# Patient Record
Sex: Female | Born: 1960 | Race: White | Hispanic: No | Smoking: Never smoker
Health system: Southern US, Community
[De-identification: ages and names within clinical notes are randomized; demographics above are authoritative.]

## PROBLEM LIST (undated history)

## (undated) DIAGNOSIS — E559 Vitamin D deficiency, unspecified: Secondary | ICD-10-CM

## (undated) DIAGNOSIS — F32A Depression, unspecified: Secondary | ICD-10-CM

## (undated) DIAGNOSIS — F319 Bipolar disorder, unspecified: Secondary | ICD-10-CM

## (undated) DIAGNOSIS — E785 Hyperlipidemia, unspecified: Secondary | ICD-10-CM

## (undated) DIAGNOSIS — R011 Cardiac murmur, unspecified: Secondary | ICD-10-CM

## (undated) DIAGNOSIS — C801 Malignant (primary) neoplasm, unspecified: Secondary | ICD-10-CM

## (undated) DIAGNOSIS — N979 Female infertility, unspecified: Secondary | ICD-10-CM

## (undated) DIAGNOSIS — Z8719 Personal history of other diseases of the digestive system: Secondary | ICD-10-CM

## (undated) DIAGNOSIS — R7303 Prediabetes: Secondary | ICD-10-CM

## (undated) DIAGNOSIS — K219 Gastro-esophageal reflux disease without esophagitis: Secondary | ICD-10-CM

## (undated) DIAGNOSIS — F329 Major depressive disorder, single episode, unspecified: Secondary | ICD-10-CM

## (undated) DIAGNOSIS — F419 Anxiety disorder, unspecified: Secondary | ICD-10-CM

## (undated) DIAGNOSIS — R739 Hyperglycemia, unspecified: Secondary | ICD-10-CM

## (undated) DIAGNOSIS — R112 Nausea with vomiting, unspecified: Secondary | ICD-10-CM

## (undated) DIAGNOSIS — T4145XA Adverse effect of unspecified anesthetic, initial encounter: Secondary | ICD-10-CM

## (undated) DIAGNOSIS — G8929 Other chronic pain: Secondary | ICD-10-CM

## (undated) DIAGNOSIS — Z780 Asymptomatic menopausal state: Secondary | ICD-10-CM

## (undated) DIAGNOSIS — C55 Malignant neoplasm of uterus, part unspecified: Secondary | ICD-10-CM

## (undated) DIAGNOSIS — R002 Palpitations: Secondary | ICD-10-CM

## (undated) DIAGNOSIS — Z9889 Other specified postprocedural states: Secondary | ICD-10-CM

## (undated) DIAGNOSIS — I10 Essential (primary) hypertension: Secondary | ICD-10-CM

## (undated) DIAGNOSIS — E669 Obesity, unspecified: Secondary | ICD-10-CM

## (undated) HISTORY — DX: Depression, unspecified: F32.A

## (undated) HISTORY — DX: Prediabetes: R73.03

## (undated) HISTORY — DX: Asymptomatic menopausal state: Z78.0

## (undated) HISTORY — DX: Anxiety disorder, unspecified: F41.9

## (undated) HISTORY — DX: Essential (primary) hypertension: I10

## (undated) HISTORY — PX: LAPAROSCOPY: SHX197

## (undated) HISTORY — DX: Major depressive disorder, single episode, unspecified: F32.9

## (undated) HISTORY — DX: Gastro-esophageal reflux disease without esophagitis: K21.9

## (undated) HISTORY — DX: Bipolar disorder, unspecified: F31.9

## (undated) HISTORY — DX: Hyperlipidemia, unspecified: E78.5

## (undated) HISTORY — DX: Obesity, unspecified: E66.9

## (undated) HISTORY — DX: Palpitations: R00.2

## (undated) HISTORY — DX: Other chronic pain: G89.29

## (undated) HISTORY — DX: Female infertility, unspecified: N97.9

## (undated) HISTORY — DX: Hyperglycemia, unspecified: R73.9

## (undated) HISTORY — DX: Malignant neoplasm of uterus, part unspecified: C55

## (undated) HISTORY — DX: Vitamin D deficiency, unspecified: E55.9

---
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
## 1998-03-24 ENCOUNTER — Other Ambulatory Visit: Admission: RE | Admit: 1998-03-24 | Discharge: 1998-03-24 | Payer: Self-pay | Admitting: Obstetrics and Gynecology

## 2001-10-07 ENCOUNTER — Other Ambulatory Visit: Admission: RE | Admit: 2001-10-07 | Discharge: 2001-10-07 | Payer: Self-pay | Admitting: Obstetrics and Gynecology

## 2002-10-09 ENCOUNTER — Other Ambulatory Visit: Admission: RE | Admit: 2002-10-09 | Discharge: 2002-10-09 | Payer: Self-pay | Admitting: Obstetrics and Gynecology

## 2003-11-30 ENCOUNTER — Other Ambulatory Visit: Admission: RE | Admit: 2003-11-30 | Discharge: 2003-11-30 | Payer: Self-pay | Admitting: Obstetrics and Gynecology

## 2006-02-20 ENCOUNTER — Ambulatory Visit: Payer: Self-pay | Admitting: Cardiology

## 2006-03-06 ENCOUNTER — Ambulatory Visit: Payer: Self-pay

## 2008-09-07 ENCOUNTER — Ambulatory Visit: Payer: Self-pay | Admitting: Cardiology

## 2008-09-07 ENCOUNTER — Ambulatory Visit: Payer: Self-pay

## 2008-09-30 ENCOUNTER — Ambulatory Visit: Payer: Self-pay

## 2008-10-13 ENCOUNTER — Emergency Department (HOSPITAL_BASED_OUTPATIENT_CLINIC_OR_DEPARTMENT_OTHER): Admission: EM | Admit: 2008-10-13 | Discharge: 2008-10-13 | Payer: Self-pay | Admitting: Emergency Medicine

## 2008-10-21 ENCOUNTER — Ambulatory Visit: Payer: Self-pay | Admitting: Cardiology

## 2008-10-21 DIAGNOSIS — E7800 Pure hypercholesterolemia, unspecified: Secondary | ICD-10-CM | POA: Insufficient documentation

## 2008-10-21 DIAGNOSIS — R079 Chest pain, unspecified: Secondary | ICD-10-CM | POA: Insufficient documentation

## 2008-10-21 DIAGNOSIS — R002 Palpitations: Secondary | ICD-10-CM | POA: Insufficient documentation

## 2008-10-21 DIAGNOSIS — F319 Bipolar disorder, unspecified: Secondary | ICD-10-CM | POA: Insufficient documentation

## 2008-10-21 DIAGNOSIS — K219 Gastro-esophageal reflux disease without esophagitis: Secondary | ICD-10-CM | POA: Insufficient documentation

## 2010-10-16 ENCOUNTER — Encounter: Payer: Self-pay | Admitting: Gastroenterology

## 2011-01-25 ENCOUNTER — Emergency Department (HOSPITAL_BASED_OUTPATIENT_CLINIC_OR_DEPARTMENT_OTHER)
Admission: EM | Admit: 2011-01-25 | Discharge: 2011-01-25 | Disposition: A | Discharge status: Home / Self Care | Payer: Medicare Other | Attending: Emergency Medicine | Admitting: Emergency Medicine

## 2011-01-25 ENCOUNTER — Emergency Department (INDEPENDENT_AMBULATORY_CARE_PROVIDER_SITE_OTHER): Payer: Medicare Other

## 2011-01-25 DIAGNOSIS — H5789 Other specified disorders of eye and adnexa: Secondary | ICD-10-CM | POA: Insufficient documentation

## 2011-01-25 DIAGNOSIS — S0003XA Contusion of scalp, initial encounter: Secondary | ICD-10-CM

## 2011-01-25 DIAGNOSIS — S0180XA Unspecified open wound of other part of head, initial encounter: Secondary | ICD-10-CM | POA: Insufficient documentation

## 2011-01-25 DIAGNOSIS — S1093XA Contusion of unspecified part of neck, initial encounter: Secondary | ICD-10-CM

## 2011-01-25 DIAGNOSIS — F319 Bipolar disorder, unspecified: Secondary | ICD-10-CM | POA: Insufficient documentation

## 2011-01-25 DIAGNOSIS — E7800 Pure hypercholesterolemia, unspecified: Secondary | ICD-10-CM | POA: Insufficient documentation

## 2011-01-25 DIAGNOSIS — K219 Gastro-esophageal reflux disease without esophagitis: Secondary | ICD-10-CM | POA: Insufficient documentation

## 2011-01-25 DIAGNOSIS — R51 Headache: Secondary | ICD-10-CM

## 2011-01-30 ENCOUNTER — Emergency Department (HOSPITAL_BASED_OUTPATIENT_CLINIC_OR_DEPARTMENT_OTHER)
Admission: EM | Admit: 2011-01-30 | Discharge: 2011-01-30 | Disposition: A | Discharge status: Home / Self Care | Payer: Medicare Other | Attending: Emergency Medicine | Admitting: Emergency Medicine

## 2011-01-30 DIAGNOSIS — E7800 Pure hypercholesterolemia, unspecified: Secondary | ICD-10-CM | POA: Insufficient documentation

## 2011-01-30 DIAGNOSIS — K219 Gastro-esophageal reflux disease without esophagitis: Secondary | ICD-10-CM | POA: Insufficient documentation

## 2011-01-30 DIAGNOSIS — F319 Bipolar disorder, unspecified: Secondary | ICD-10-CM | POA: Insufficient documentation

## 2011-01-30 DIAGNOSIS — Z79899 Other long term (current) drug therapy: Secondary | ICD-10-CM | POA: Insufficient documentation

## 2011-01-30 DIAGNOSIS — Z4802 Encounter for removal of sutures: Secondary | ICD-10-CM | POA: Insufficient documentation

## 2011-02-07 NOTE — Assessment & Plan Note (Signed)
[facility]                            CARDIOLOGY OFFICE NOTE   [name], [name]                         MRN:          [id number]  DATE:09/07/2008                            DOB:          10-27-1960    Sherry Strong is a pleasant 50 year old female that I last saw on Feb 20, 2006.  At that time, she was complaining of chest pain.  We scheduled a  Myoview on March 06, 2006, that showed no ischemia or infarction.  Since  I last saw her, she has done reasonably well.  However in September, she  had an episode of chest pain.  This occurred after eating cheeseburger.  The pain was described as a pressure sensation.  It radiated to the  left.  It was not pleuritic or positional, nor is it exertional.  It  lasts for approximately 45 minutes and resolve spontaneously.  There was  no associated nausea, vomiting, shortness of breath, but there was mild  diaphoresis.  She did well until yesterday when she had a second  episode.  This occurred again after eating lunch.  It was similar in  description to the previous and again lasted 45 minutes.  Because of the  above, she wanted to be evaluated further.  Also note, she also has  palpitations.  She describes times where her heart is pounding.  This  can last anywhere from 5 minutes to an hour.  There is no associated  chest pain, shortness of breath, or presyncope.  She has had no frank  syncopal episodes.  These occur fairly frequently, although she finds it  difficult to quantitate.  She denies any dyspnea on exertion, orthopnea,  or PND.  There is no exertional chest pain.   MEDICATIONS:  1. Protonix 40 mg p.o. daily.  2. Pravachol 40 mg p.o. nightly.  3. Lamictal 100 mg p.o. nightly.  4. Xanax as needed.  5. Zyrtec as needed.   PHYSICAL EXAMINATION:  VITAL SIGNS:  Today shows a blood pressure of  122/80 and her pulse is 87.  She weighs 170 pounds.  HEENT:  Normal with normal eyelids.  NECK:  Supple with a  normal upstroke bilaterally.  No bruits.  There is  no jugular venous distention.  CHEST:  Clear to auscultation.  No expansion.  CARDIOVASCULAR:  Regular rate and rhythm.  Normal S1 and S2.  There are  no murmurs, rubs, or gallops noted.  There are no changes with Valsalva.  ABDOMEN:  Nontender, nondistended.  Positive bowel sounds.  No  hepatosplenomegaly.  No mass appreciated.  There is no abdominal bruit.  She has 2+ femoral pulses bilaterally.  No bruits.  EXTREMITIES:  No edema.  I could palpate no cords.  She has 2+ posterior  tibial pulses bilaterally.  NEUROLOGIC:  Grossly intact.   Electrocardiogram shows a sinus rhythm at rate of 87.  Prior septal  infarct cannot be excluded.  There are no significant ST changes noted.   DIAGNOSES:  1. Chest pain - Sherry Strong symptoms are somewhat atypical and more  likely to be GI in etiology.  However, she has a brother who had a      myocardial function in his 40s and she is very concerned about      these symptoms.  We will schedule her to have stress Myoview.  If      it shows normal perfusion, we will not pursue further cardiac      evaluation.  2. Palpitations - We will schedule her to have a CardioNet monitor to      make sure that she has not had significant arrhythmias.  3. Probable gastroesophageal reflux disease - She will continue on her      Protonix.  4. Hyperlipidemia - She will continue on her Pravachol and this is      being managed by her primary care physician.  5. History of bipolar disorder.   I will see her back in 6 weeks to review the above.     Madolyn Frieze Jens Som, MD, [facility]  Electronically Signed    BSC/MedQ  DD: 09/07/2008  DT: 09/08/2008  Job #: [id number]

## 2011-02-07 NOTE — Assessment & Plan Note (Signed)
[facility]                            CARDIOLOGY OFFICE NOTE   BRYANDA, MIKEL                         MRN:          [id number]  DATE:10/21/2008                            DOB:          04-06-1961    Ms. Crow is a 50 year old female that recently saw on September 07, 2008, secondary to atypical chest pain and palpitations.  A Myoview was  performed on September 30, 2008.  At that time, her ejection fraction was  77% and it was normal perfusion.  She also had a CardioNet monitor that  showed sinus to sinus tachycardia with occasional PACs.  Since I saw her  previously, she feels stressed.  She is having some financial issues.  She denies any dyspnea, chest pain, and palpitations have improved.  There is no pedal edema.   MEDICATIONS:  1. Protonix 40 mg p.o. daily.  2. Pravachol 40 mg p.o. daily.  3. Lamictal.   PHYSICAL EXAMINATION:  VITAL SIGNS:  Today shows a blood pressure that  is elevated at 137/91 and pulse 81.  She weighs 168 pounds.  HEENT:  Normal.  NECK:  Supple.  CHEST:  Clear.  CARDIOVASCULAR:  Regular rate and rhythm.  ABDOMEN:  No tenderness.  EXTREMITIES:  No edema.   DIAGNOSES:  1. Recent chest pain - the patient has had no further symptoms and it      is sounds this may have been gastroenterology in etiology.  Her      Myoview is normal.  We will not pursue this further.  2. Palpitations - her CardioNet monitor showed sinus to sinus      tachycardia with premature atrial contractions.  We have discussed      the possibility of adding the beta-blocker in the future if her      palpitations are worsen.  However, for now, we will not pursue this      as they have improved.  3. Elevated blood pressure - she will track this and we could add      medications in the future if remains elevated.  4. Gastroesophageal reflux disease - she will continue on Protonix.  5. Hyperlipidemia - she will continue her statin.  This is being     managed per primary care physician.  6. History of bipolar disorder.   I will see her back on an as-needed basis.     Madolyn Frieze Jens Som, MD, [facility]  Electronically Signed    BSC/MedQ  DD: 10/21/2008  DT: 10/21/2008  Job #: [id number]

## 2012-09-14 ENCOUNTER — Emergency Department (HOSPITAL_COMMUNITY)
Admission: EM | Admit: 2012-09-14 | Discharge: 2012-09-14 | Disposition: A | Discharge status: Home / Self Care | Payer: Medicare Other | Attending: Emergency Medicine | Admitting: Emergency Medicine

## 2012-09-14 ENCOUNTER — Encounter (HOSPITAL_COMMUNITY): Payer: Self-pay | Admitting: Emergency Medicine

## 2012-09-14 ENCOUNTER — Emergency Department (HOSPITAL_COMMUNITY): Payer: Medicare Other

## 2012-09-14 DIAGNOSIS — Y9389 Activity, other specified: Secondary | ICD-10-CM | POA: Insufficient documentation

## 2012-09-14 DIAGNOSIS — Y929 Unspecified place or not applicable: Secondary | ICD-10-CM | POA: Insufficient documentation

## 2012-09-14 DIAGNOSIS — S0003XA Contusion of scalp, initial encounter: Secondary | ICD-10-CM | POA: Insufficient documentation

## 2012-09-14 DIAGNOSIS — S1093XA Contusion of unspecified part of neck, initial encounter: Secondary | ICD-10-CM | POA: Insufficient documentation

## 2012-09-14 DIAGNOSIS — T148XXA Other injury of unspecified body region, initial encounter: Secondary | ICD-10-CM

## 2012-09-14 DIAGNOSIS — W1809XA Striking against other object with subsequent fall, initial encounter: Secondary | ICD-10-CM | POA: Insufficient documentation

## 2012-09-14 DIAGNOSIS — F411 Generalized anxiety disorder: Secondary | ICD-10-CM | POA: Insufficient documentation

## 2012-09-14 DIAGNOSIS — Z79899 Other long term (current) drug therapy: Secondary | ICD-10-CM | POA: Insufficient documentation

## 2012-09-14 NOTE — ED Notes (Signed)
Per pt, she was laying on the couch and she fell off. When she fell of she hit her face on the coffee table. Left eye is swollen and bruised. Pupils reactive to light. No LOC. No headache. Pain in left eye only. ETOH on board. Pt has anxiety. No bleeding noted.

## 2012-09-14 NOTE — ED Provider Notes (Signed)
Medical screening examination/treatment/procedure(s) were performed by non-physician practitioner and as supervising physician I was immediately available for consultation/collaboration.  Olivia Mackie, MD 09/14/12 [phone]

## 2012-09-14 NOTE — ED Provider Notes (Signed)
History     CSN: [id number]  Arrival date & time 09/14/12  0327   First MD Initiated Contact with Patient 09/14/12 [phone]      Chief Complaint  Patient presents with  . Fall  . Facial Swelling    (Consider location/radiation/quality/duration/timing/severity/associated sxs/prior treatment) HPI Comments: This is a 51 year old female, who presents emergency department with chief complaint of blunt trauma to cheek. Patient states that she rolled off the couch and hit her coffee table with the left side of her face. She states that she was drunk at that time. Patient states that her pain is 5/10, but that she is very anxious. She has used ice to control her symptoms. There are no aggravating or alleviating factors.  The history is provided by the patient. No language interpreter was used.    No past medical history on file.  No past surgical history on file.  No family history on file.  History  Substance Use Topics  . Smoking status: Not on file  . Smokeless tobacco: Not on file  . Alcohol Use: Not on file    OB History    No data available      Review of Systems  All other systems reviewed and are negative.    Allergies  Review of patient's allergies indicates no known allergies.  Home Medications   Current Outpatient Rx  Name  Route  Sig  Dispense  Refill  . ALPRAZOLAM 0.5 MG PO TABS   Oral   Take 0.5 mg by mouth 2 (two) times daily as needed. For anxiety         . BUSPIRONE HCL 10 MG PO TABS   Oral   Take 20 mg by mouth every evening.         . FENOFIBRATE 145 MG PO TABS   Oral   Take 145 mg by mouth every evening.         Marland Kitchen LAMOTRIGINE 200 MG PO TABS   Oral   Take 200 mg by mouth daily.         Marland Kitchen PANTOPRAZOLE SODIUM 40 MG PO TBEC   Oral   Take 40 mg by mouth daily.         Marland Kitchen PRAVASTATIN SODIUM 40 MG PO TABS   Oral   Take 40 mg by mouth every evening.           BP 154/87  Pulse 105  Temp 99.1 F (37.3 C) (Oral)  Resp 16  SpO2  99%  Physical Exam  Nursing note and vitals reviewed. Constitutional: She is oriented to person, place, and time. She appears well-developed and well-nourished.  HENT:  Head: Normocephalic.       Significant swelling to the left cheek and eye, tender to palpation. No step-off, crepitus, or palpable bony deformity, no evidence of orbital blowout.  Eyes: Conjunctivae normal and EOM are normal. Pupils are equal, round, and reactive to light.       No pain with eye movement, no bleeding in eye,  Neck: Normal range of motion. Neck supple.  Cardiovascular: Normal rate and regular rhythm.  Exam reveals no gallop and no friction rub.   No murmur heard. Pulmonary/Chest: Effort normal and breath sounds normal. No respiratory distress. She has no wheezes. She has no rales. She exhibits no tenderness.  Abdominal: Soft. Bowel sounds are normal. She exhibits no distension and no mass. There is no tenderness. There is no rebound and no guarding.  Musculoskeletal: Normal range of  motion. She exhibits no edema and no tenderness.  Neurological: She is alert and oriented to person, place, and time.  Skin: Skin is warm and dry.  Psychiatric: She has a normal mood and affect. Her behavior is normal. Judgment and thought content normal.    ED Course  Procedures (including critical care time)  No results found for this or any previous visit. Dg Facial Bones Complete  09/14/2012  *RADIOLOGY REPORT*  Clinical Data: Left orbital pain status post trauma.  FACIAL BONES COMPLETE 3+V  Comparison: 01/25/2011  Findings: Paranasal sinuses are clear.  No significantly displaced orbital fracture.  No aggressive osseous lesion.  IMPRESSION: No displaced fracture identified.  Radiographs have limited sensitivity for an orbital fracture and if concern persists, maxillofacial bone CT should be obtained.   Original Report Authenticated By: Jearld Lesch, M.D.       1. Contusion       MDM  51 year old female with  contusion to left side of her face. Have discussed patient with Dr. Norlene Campbell, who agrees with the plan. Patient's stable and ready for discharge. She understands and agrees with the plan.       Roxy Horseman, PA-C 09/14/12 [phone]

## 2014-08-10 DIAGNOSIS — K219 Gastro-esophageal reflux disease without esophagitis: Secondary | ICD-10-CM | POA: Insufficient documentation

## 2014-12-14 DIAGNOSIS — R251 Tremor, unspecified: Secondary | ICD-10-CM | POA: Insufficient documentation

## 2016-09-01 ENCOUNTER — Other Ambulatory Visit: Payer: Self-pay | Admitting: Nurse Practitioner

## 2016-09-01 DIAGNOSIS — R42 Dizziness and giddiness: Secondary | ICD-10-CM

## 2016-09-05 ENCOUNTER — Ambulatory Visit
Admission: RE | Admit: 2016-09-05 | Discharge: 2016-09-05 | Disposition: A | Discharge status: Home / Self Care | Payer: Medicare Other | Source: Ambulatory Visit | Attending: Nurse Practitioner | Admitting: Nurse Practitioner

## 2016-09-05 DIAGNOSIS — R42 Dizziness and giddiness: Secondary | ICD-10-CM

## 2016-09-26 ENCOUNTER — Encounter (HOSPITAL_COMMUNITY): Payer: Self-pay

## 2016-10-16 ENCOUNTER — Encounter (INDEPENDENT_AMBULATORY_CARE_PROVIDER_SITE_OTHER): Payer: Self-pay

## 2016-10-16 ENCOUNTER — Encounter (HOSPITAL_COMMUNITY): Payer: Self-pay | Admitting: Psychiatry

## 2016-10-16 ENCOUNTER — Ambulatory Visit (INDEPENDENT_AMBULATORY_CARE_PROVIDER_SITE_OTHER): Payer: Medicare Other | Admitting: Psychiatry

## 2016-10-16 VITALS — BP 126/78 | HR 70 | Ht 63.0 in | Wt 199.8 lb

## 2016-10-16 DIAGNOSIS — Z818 Family history of other mental and behavioral disorders: Secondary | ICD-10-CM

## 2016-10-16 DIAGNOSIS — F3131 Bipolar disorder, current episode depressed, mild: Secondary | ICD-10-CM

## 2016-10-16 DIAGNOSIS — Z79899 Other long term (current) drug therapy: Secondary | ICD-10-CM | POA: Diagnosis not present

## 2016-10-16 DIAGNOSIS — Z7982 Long term (current) use of aspirin: Secondary | ICD-10-CM | POA: Diagnosis not present

## 2016-10-16 MED ORDER — LAMOTRIGINE 200 MG PO TABS: 200.0000 mg | ORAL_TABLET | Freq: Every day | ORAL | 1 refills | Status: DC

## 2016-10-16 MED ORDER — HYDROXYZINE PAMOATE 25 MG PO CAPS: 25.0000 mg | ORAL_CAPSULE | Freq: Every day | ORAL | 1 refills | Status: DC | PRN

## 2016-10-16 MED ORDER — LURASIDONE HCL 120 MG PO TABS: 120.0000 mg | ORAL_TABLET | Freq: Every day | ORAL | 1 refills | Status: DC

## 2016-10-16 NOTE — Progress Notes (Signed)
[facility] Initial Assessment Note  Deshannon Urbano [id number] 56 y.o.  10/16/2016 9:55 AM  Chief Complaint:  I need a new psychiatrist.  My provider is leaving.    History of Present Illness:  Sherry Strong is 56 year old Caucasian, unemployed, divorced female who is referred from her primary care physician for the management of her panic attacks and bipolar disorder.  She was seeing [name] in [facility] who left the practice.  She mentioned that she has chronic symptoms of depression, panic attacks and impulsive behavior.  She is taking psychiatric medication and seeing counselor since age 40.  She had to stop seeing counselor because she felt it did not help.  She is taking Latuda, Lamictal, Xanax and reported no side effects.  She was also prescribed BuSpar however she stopped taking because she did not see any improvement.  She admitted lately more stressed because of financial stress.  Recently she moved to [facility] after her boyfriend father passed away in 01/18/23.  Patient mentioned his house was paid off and it was a good opportunity to move there.  However she admitted having issues with the boyfriend and financial burden causing more stress in her life.  She admitted poor sleep, racing thoughts and continued to have panic attacks.  However she is very reluctant to change her medication.  She had tried multiple psychiatric medication in the past and either she developed side effects or did not work.  Recently she had episodes of dizziness and she is scheduled to see neurologist next month.  Patient admitted easily emotional and having crying spells but denies any suicidal thoughts, paranoia, hallucination or any feeling of hopelessness or worthlessness.  She endorse increased weight gain because she had stopped exercise and watching her calorie intake.  She is tolerating her current psychiatric medication and reported no side effects including rash, itching, shakes or any tremors.  She do  not recall any manic symptoms and recent months but endorsed easily emotional, irritable, short temper and having racing thoughts.  In the past she had severe anger issues with impulsive and risky behavior.  She remember running away from house, sleeping with strangers, excessive buying, using drugs, fighting and road rage.  Patient currently not seeing any therapist but willing to see someone to give a try.  She endorse drinking alcohol once a week with her boyfriend but denies any intoxication, blackouts, seizures or tremors.  Currently she is taking Latuda 120 mg daily, Lamictal 200 mg daily and Xanax described 0.5 mg up to 4 times a day but she takes 2-3 times a day.  Patient denies any anhedonia, active or passive suicidal thoughts but endorsed decreased energy, fatigue, lack of attention and concentration.  She also endorsed poor sleep and depressed mood.  She denies any nightmares, flashback, obsessive thoughts, phobias, hypervigilance, delusion and self abusive behavior.  She endorse panic attack but usually last 10-15 minutes and reported Xanax helped him a lot.  She is living with her boyfriend.  She has 58 year old son who lives with her ex-husband.  Patient is on disability.  Suicidal Ideation: No Plan Formed: No Patient has means to carry out plan: No  Homicidal Ideation: No Plan Formed: No Patient has means to carry out plan: No  Past Psychiatric History/Hospitalization(s): Patient endorse symptoms of irritability, anger, impulsive behavior, depression and risky behavior most of her life.  She remember running away from home, sleeping with strangers, excessive buying, using drugs and drinking at early age and road rage started  in her teens.  She started seeing counselors at age 74 and prescribed Toprol medication through her primary care physician.  She remember taking Abilify, Wellbutrin, Effexor, Lexapro, Prozac, Klonopin, Ambien and lithium.  She remember either of these medication caused  side effects or did not help.  She denies any history of psychiatric inpatient treatment, suicidal attempt, at [facility] or any hallucination.  She was seeing [name] in [facility] who left the practice.  Family History; Patient endorse mother has depression and one cousin diagnosed with bipolar disorder.  Medical History; Patient has hyperlipidemia, increased triglycerides, GERD and recently having dizziness.  Her primary care physician is Eldridge Abrahams.  She scheduled to see neurologist next month.  Patient denies any history of seizures.  She had blood work in November 2017 which shows CBC normal except MCH 32.4.  Her comprehensive metabolic panel is normal area glucose 102, creatinine 0.84.  Her hemoglobin A1c is 5.3, triglyceride 161 and total cholesterol 223.  Traumatic brain injury: Patient denies any history of traumatic brain injury.  Education and Work History; Patient is high school graduate.  She is on disability.  Psychosocial History; Patient born in [place] but at age 81 to [place] closer to her grandparents.  She was never close to her father but close to her mother.  She married once and her marriage last 49 years.  Together they have 67 year old son.  Currently patient is living with her boyfriend.  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  Legal History; Patient denies any legal issues.  History Of Abuse; Patient denies any history of abuse.  Substance Abuse History; In the past she had a history of heavy drinking and using drugs but claims to be sober from drugs since teens.  She still drink alcohol once a week but denies any binge, intoxication, blackouts.  Review of Systems: Psychiatric: Agitation: No Hallucination: No Depressed Mood: No Insomnia: Yes Hypersomnia: No Altered Concentration: No Feels Worthless: No Grandiose Ideas: No Belief In Special Powers: No New/Increased Substance Abuse: No Compulsions:  No  Neurologic: Headache: No Seizure: No Paresthesias: No   Outpatient Encounter Prescriptions as of 10/16/2016  Medication Sig  . ALPRAZolam (XANAX) 0.5 MG tablet Take 0.5 mg by mouth 2 (two) times daily as needed. For anxiety  . aspirin EC 81 MG tablet Take 81 mg by mouth.  . fenofibrate (TRICOR) 145 MG tablet Take 145 mg by mouth every evening.  . hydrochlorothiazide (HYDRODIURIL) 12.5 MG tablet TAKE 1/2 TO 1 TABLET DAILY AS NEEDED EDEMA  . ibuprofen (ADVIL,MOTRIN) 800 MG tablet TAKE 1 TABLET BY MOUTH EVERY 8 HOURS AS NEEDED FOR PAIN WITH FOOD  . lamoTRIgine (LAMICTAL) 200 MG tablet Take 1 tablet (200 mg total) by mouth daily.  . Lurasidone HCl 120 MG TABS Take 1 tablet (120 mg total) by mouth at bedtime.  . metoprolol succinate (TOPROL-XL) 25 MG 24 hr tablet Take 25 mg by mouth.  . pantoprazole (PROTONIX) 40 MG tablet Take 40 mg by mouth daily.  . pravastatin (PRAVACHOL) 40 MG tablet Take 40 mg by mouth every evening.  . [DISCONTINUED] lamoTRIgine (LAMICTAL) 200 MG tablet Take 200 mg by mouth daily.  . [DISCONTINUED] Lurasidone HCl 120 MG TABS Take 120 mg by mouth.  . hydrOXYzine (VISTARIL) 25 MG capsule Take 1 capsule (25 mg total) by mouth daily as needed for anxiety.  . [DISCONTINUED] busPIRone (BUSPAR) 10 MG tablet Take 20 mg by mouth every evening.   No facility-administered encounter  medications on file as of 10/16/2016.     No results found for this or any previous visit (from the past 2160 hour(s)).    Constitutional:  BP 126/78   Pulse 70   Ht 5\' 3"  (1.6 m)   Wt 199 lb 12.8 oz (90.6 kg)   BMI 35.39 kg/m    Musculoskeletal: Strength & Muscle Tone: within normal limits Gait & Station: normal Patient leans: N/A  Psychiatric Specialty Exam: Physical Exam  Review of Systems  Constitutional: Negative.   HENT: Negative.   Respiratory: Negative.   Cardiovascular: Negative.   Musculoskeletal: Negative.   Skin: Negative for itching and rash.  Neurological:  Positive for dizziness.  Psychiatric/Behavioral: The patient has insomnia.     Blood pressure 126/78, pulse 70, height 5\' 3"  (1.6 m), weight 199 lb 12.8 oz (90.6 kg).Body mass index is 35.39 kg/m.  General Appearance: Casual and Emotional and easily tearful  Eye Contact:  Fair  Speech:  Clear and Coherent  Volume:  Normal  Mood:  Anxious  Affect:  Appropriate  Thought Process:  Goal Directed  Orientation:  Full (Time, Place, and Person)  Thought Content:  WDL and Logical  Suicidal Thoughts:  No  Homicidal Thoughts:  No  Memory:  Immediate;   Fair Recent;   Fair Remote;   Fair  Judgement:  Good  Insight:  Fair  Psychomotor Activity:  Normal  Concentration:  Concentration: Fair and Attention Span: Fair  Recall:  [employer] of Knowledge:  Good  Language:  Good  Akathisia:  No  Handed:  Right  AIMS (if indicated):     Assets:  Communication Skills Desire for Improvement Housing  ADL's:  Intact  Cognition:  WNL  Sleep:       Established Problem, Stable/Improving (1), New problem, with additional work up planned, Review of Psycho-Social Stressors (1), Review or order clinical lab tests (1), Decision to obtain old records (1), Review and summation of old records (2), Established Problem, Worsening (2), New Problem, with no additional work-up planned (3), Review of Medication Regimen & Side Effects (2) and Review of New Medication or Change in Dosage (2)  Assessment: Axis I: Bipolar disorder, depressed type.  Panic attacks.    Plan:  I review her symptoms, history, collateral information from Eldridge Abrahams, blood work, current medication and psychosocial stressors.  Patient still have anxiety and appears very emotional.  She does not want to increase her psychiatric medication.  I recommended to add low-dose Vistaril to help her anxiety and insomnia.  Discussed medication side effects and benefits.  She preferred to take Xanax from her primary care physician.  I offer to try  Klonopin but patient declined.  Continue Latuda 120 mg daily, Lamictal 200 mg daily.  I will also refer her to see therapist in this office for coping skills.  We will get records from her previous psychiatrist.  She is scheduled to see neurologist for her dizziness.  Recommended to call us back if she has any question, concern if she feels worsening of the symptom.  Follow-up in 6 weeks.  ARFEEN,SYED T., MD 10/16/2016

## 2016-11-02 ENCOUNTER — Ambulatory Visit (INDEPENDENT_AMBULATORY_CARE_PROVIDER_SITE_OTHER): Payer: Medicare Other | Admitting: Psychiatry

## 2016-11-02 DIAGNOSIS — F3131 Bipolar disorder, current episode depressed, mild: Secondary | ICD-10-CM | POA: Diagnosis not present

## 2016-11-04 NOTE — Progress Notes (Signed)
Comprehensive Clinical Assessment (CCA) Note  11/04/2016 Sherry Strong [facility] [id number]  Visit Diagnosis:      ICD-9-CM ICD-10-CM   1. Bipolar affective disorder, currently depressed, mild ([place]) 296.51 F31.31       CCA Part One  Part One has been completed on paper by the patient.  (See scanned document in Chart Review)  CCA Part Two A  Intake/Chief Complaint:  CCA Intake With Chief Complaint CCA Part Two Date: 11/02/16 Chief Complaint/Presenting Problem: bipolar disorder Patients Currently Reported Symptoms/Problems: sadness, tearfulness, anxiety, tension in body, overeating carbohydrates Collateral Involvement: none Individual's Strengths: supportive, encouraging Individual's Abilities: caregiver for her mother Type of Services Patient Feels Are Needed: therapy  Mental Health Symptoms Depression:  Depression: Difficulty Concentrating, Fatigue, Hopelessness, Increase/decrease in appetite, Irritability, Sleep (too much or little) (sleeps 6 hours a night)  Mania:  Mania: Irritability, Racing thoughts  Anxiety:   Anxiety: Difficulty concentrating, Fatigue, Irritability, Restlessness, Tension, Worrying  Psychosis:  Psychosis: N/A  Trauma:  Trauma: N/A  Obsessions:  Obsessions: N/A  Compulsions:  Compulsions: N/A  Inattention:     Hyperactivity/Impulsivity:  Hyperactivity/Impulsivity: Fidgets with hands/feet, Feeling of restlessness, Symptoms present before age 52, Talks excessively  Oppositional/Defiant Behaviors:  Oppositional/Defiant Behaviors: N/A  Borderline Personality:  Emotional Irregularity: N/A  Other Mood/Personality Symptoms:      Mental Status Exam Appearance and self-care  Stature:  Stature: Small  Weight:  Weight: Overweight  Clothing:  Clothing: Casual  Grooming:  Grooming: Normal  Cosmetic use:  Cosmetic Use: Age appropriate  Posture/gait:  Posture/Gait: Normal  Motor activity:  Motor Activity: Restless  Sensorium  Attention:  Attention: Normal   Concentration:  Concentration: Normal  Orientation:  Orientation: X5  Recall/memory:  Recall/Memory: Normal  Affect and Mood  Affect:  Affect: Anxious  Mood:  Mood: Anxious  Relating  Eye contact:  Eye Contact: Normal  Facial expression:  Facial Expression: Responsive  Attitude toward examiner:  Attitude Toward Examiner: Cooperative  Thought and Language  Speech flow: Speech Flow: Normal  Thought content:  Thought Content: Appropriate to mood and circumstances  Preoccupation:     Hallucinations:     Organization:     Transport planner of Knowledge:  Fund of Knowledge: Average  Intelligence:  Intelligence: Average  Abstraction:  Abstraction: Normal  Judgement:  Judgement: Normal  Reality Testing:  Reality Testing: Realistic  Insight:  Insight: Good  Decision Making:  Decision Making: Normal  Social Functioning  Social Maturity:  Social Maturity: Isolates  Social Judgement:  Social Judgement: Normal  Stress  Stressors:  Stressors: Chiropodist, Housing, Family conflict, Transitions  Coping Ability:  Coping Ability: English as a second language teacher Deficits:     Supports:      Family and Psychosocial History: Family history Marital status: Divorced Divorced, when?: divorced in 2008; long-term relationship with boyfriend since 2009 Long term relationship, how long?: 9 years What types of issues is patient dealing with in the relationship?: she and partner have different personality types; Pt. 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 Problems with trust; inconsistent values Are you sexually active?: Yes What is your sexual orientation?: heterosexual Has your sexual activity been affected by drugs, alcohol, medication, or emotional stress?: no Does patient have children?: Yes How many children?: 1 How is patient's relationship with their children?: very good  Childhood History:  Childhood History By whom was/is the patient raised?: Both parents Additional childhood  history information: both parents raised until 9th grade when parents divorced Description of patient's relationship with  caregiver when they were a child: relationship with father "fell off the radar" after the divorce and has been on again and off again. Mother has dementia diagnosed about 4 years ago and Pt. is her caregiver Patient's description of current relationship with people who raised him/her: caregiver for her mother Does patient have siblings?: Yes Number of Siblings: 1 Description of patient's current relationship with siblings: brother is 3 years younger Did patient suffer any verbal/emotional/physical/sexual abuse as a child?: No Did patient suffer from severe childhood neglect?: No Has patient ever been sexually abused/assaulted/raped as an adolescent or adult?: No Was the patient ever a victim of a crime or a disaster?: No Witnessed domestic violence?: No Has patient been effected by domestic violence as an adult?: No  CCA Part Two B  Employment/Work Situation: Employment / Work Situation Employment situation: Employed Where is patient currently employed?: 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 How long has patient been employed?: Since February 2017 Patient's job has been impacted by current illness: No What is the longest time patient has a held a job?: April 1988-2007 Where was the patient employed at that time?: [employer] Has patient ever been in the [employer]?: No Has patient ever served in combat?: No Did You Receive Any Psychiatric Treatment/Services While in Passenger transport manager?: No Are There Guns or Other Weapons in Your Home?: No Are These Psychologist, educational?: Yes  Education: Education School Currently Attending: none Last Grade Completed: 12 Name of High School: [employer] Did Teacher, adult education From [employer]?: Yes Did Physicist, medical?: No Did Heritage manager?: No Did You Have An Individualized Education Program (IIEP):  No Did You Have Any Difficulty At [employer]?: No  Religion: Religion/Spirituality Are You A Religious Person?: Yes What is Your Religious Affiliation?: [employer] How Might This Affect Treatment?: n/a  Leisure/Recreation: Leisure / Recreation Leisure and Hobbies: scrapbooking  Exercise/Diet: Exercise/Diet Do You Exercise?: No Have You Gained or Lost A Significant Amount of Weight in the Past Six Months?: No Do You Follow a Special Diet?: No Do You Have Any Trouble Sleeping?: Yes Explanation of Sleeping Difficulties: Sleeps about 6 hours a night  CCA Part Two C  Alcohol/Drug Use: Alcohol / Drug Use Pain Medications: none Over the Counter: supplements from [facility] History of alcohol / drug use?: No history of alcohol / drug abuse                      CCA Part Three  ASAM's:  Six Dimensions of Multidimensional Assessment  Dimension 1:  Acute Intoxication and/or Withdrawal Potential:     Dimension 2:  Biomedical Conditions and Complications:     Dimension 3:  Emotional, Behavioral, or Cognitive Conditions and Complications:     Dimension 4:  Readiness to Change:     Dimension 5:  Relapse, Continued use, or Continued Problem Potential:     Dimension 6:  Recovery/Living Environment:      Substance use Disorder (SUD)    Social Function:  Social Functioning Social Maturity: Isolates Social Judgement: Normal  Stress:  Stress Stressors: Chiropodist, Housing, Family conflict, Transitions Coping Ability: Overwhelmed Patient Takes Medications The Way The Doctor Instructed?: Yes Priority Risk: Moderate Risk  Risk Assessment- Self-Harm Potential: Risk Assessment For Self-Harm Potential Thoughts of Self-Harm: No current thoughts Method: No plan Availability of Means: No access/NA  Risk Assessment -Dangerous to Others Potential: Risk Assessment For Dangerous to Others Potential Method: No Plan Availability of Means:  No access or NA Notification Required: No need or identified  person  DSM5 Diagnoses: Patient Active Problem List   Diagnosis Date Noted  . HYPERCHOLESTEROLEMIA, PURE 10/21/2008  . BIPOLAR DISORDER UNSPECIFIED 10/21/2008  . GERD 10/21/2008  . PALPITATIONS 10/21/2008  . CHEST PAIN-UNSPECIFIED 10/21/2008    Patient Centered Plan: Patient is on the following Treatment Plan(s):  Pt. To complete treatment plan with individual therapist.  Recommendations for Services/Supports/Treatments: Recommendations for Services/Supports/Treatments Recommendations For Services/Supports/Treatments: IOP (Intensive Outpatient Program), Individual Therapy  Treatment Plan Summary:    Referrals to Alternative Service(s): Referred to Alternative Service(s):   Place:   Date:   Time:    Referred to Alternative Service(s):   Place:   Date:   Time:    Referred to Alternative Service(s):   Place:   Date:   Time:    Referred to Alternative Service(s):   Place:   Date:   Time:     [name]

## 2016-11-15 ENCOUNTER — Ambulatory Visit: Payer: Medicare Other | Admitting: Neurology

## 2016-11-16 ENCOUNTER — Ambulatory Visit (INDEPENDENT_AMBULATORY_CARE_PROVIDER_SITE_OTHER): Payer: Medicare Other | Admitting: Psychiatry

## 2016-11-16 DIAGNOSIS — F3131 Bipolar disorder, current episode depressed, mild: Secondary | ICD-10-CM

## 2016-11-21 NOTE — Progress Notes (Signed)
   THERAPIST PROGRESS NOTE  Session Time: 1:10-2:00  Participation Level: Active  Behavioral Response: CasualAlertAnxious  Type of Therapy: Individual Therapy  Treatment Goals addressed: Anxiety  Interventions: Strength-based and Supportive  Summary: Sherry Strong is a 56 y.o. female who presents with anxiety; insomnia.   Suicidal/Homicidal: Nowithout intent/plan  Therapist Response: Pt. Discussed primary problems of anxiety and insomnia. Pt. Reports that she has been taking xanax for the past 26 years and does not believe that she can live without it. Pt. Reports feelings of loss of control, feeling as if she cannot breathe, shaking, sweating. Pt. Reports that her panic attacks tend to be triggered when she is open, public places such as malls. Pt. Reports that she will often take a xanax in fear of the panic attack so that she can go to an open public place, and also that just knowing that she has the xanax in her purse will help her panic from escalating. Pt. Reports that she takes a xanax every night for sleep, but that she is able to go to sleep her sleep is frequently interrupted after 3-4 hours and she is not able to go back to sleep. Pt. Discussed that her primary care provider strongly wants to taper her off of xanax but that she is resistant because she does not believe that she will be able to function without it. Pt. Communicated that she is highly motivated to work on sleep disturbance and Counselor provided psychoeducation about how xanax is contributing to her anxiety. Counselor also provided psychoeducation about sleep hygiene which Pt. Indicated that she already does such as turning off tv one hour before bed. Pt. Reported that she stopped taking vistaril because dose was not helping with sleep. Pt. Was advised not to stop taking xanax without direction from her primary care and psychiatrist and would develop plan with Dr. Adele Schilder to assist with step down safely and possible  adjustment of meds to assist with the discomfort.  Plan: Return again in 2-3 weeks.  Diagnosis: Axis I: Anxiety Disorder NOS    Axis II: No diagnosis    [name], [facility] 11/21/2016

## 2016-11-28 ENCOUNTER — Encounter (HOSPITAL_COMMUNITY): Payer: Self-pay | Admitting: Psychiatry

## 2016-11-28 ENCOUNTER — Ambulatory Visit (INDEPENDENT_AMBULATORY_CARE_PROVIDER_SITE_OTHER): Payer: Medicare Other | Admitting: Psychiatry

## 2016-11-28 VITALS — BP 126/72 | HR 75 | Ht 63.0 in | Wt 205.4 lb

## 2016-11-28 DIAGNOSIS — F3131 Bipolar disorder, current episode depressed, mild: Secondary | ICD-10-CM | POA: Diagnosis not present

## 2016-11-28 DIAGNOSIS — Z79899 Other long term (current) drug therapy: Secondary | ICD-10-CM | POA: Diagnosis not present

## 2016-11-28 DIAGNOSIS — F410 Panic disorder [episodic paroxysmal anxiety] without agoraphobia: Secondary | ICD-10-CM | POA: Diagnosis not present

## 2016-11-28 DIAGNOSIS — F419 Anxiety disorder, unspecified: Secondary | ICD-10-CM | POA: Diagnosis not present

## 2016-11-28 MED ORDER — LAMOTRIGINE 100 MG PO TABS
ORAL_TABLET | ORAL | 1 refills | Status: DC
Start: 2016-11-28 — End: 2017-01-16

## 2016-11-28 MED ORDER — LURASIDONE HCL 120 MG PO TABS: 120.0000 mg | ORAL_TABLET | Freq: Every day | ORAL | 1 refills | Status: DC

## 2016-11-28 MED ORDER — LAMOTRIGINE 200 MG PO TABS: 200.0000 mg | ORAL_TABLET | Freq: Every day | ORAL | 1 refills | Status: DC
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
## 2016-12-01 ENCOUNTER — Other Ambulatory Visit (HOSPITAL_COMMUNITY): Payer: Self-pay

## 2016-12-01 DIAGNOSIS — F3131 Bipolar disorder, current episode depressed, mild: Secondary | ICD-10-CM

## 2016-12-01 MED ORDER — LURASIDONE HCL 120 MG PO TABS: 120.0000 mg | ORAL_TABLET | Freq: Every day | ORAL | 0 refills | Status: DC

## 2016-12-01 NOTE — Progress Notes (Signed)
Patients pharmacy called me, her insurance will not pay at all if it is not a 90 day prescription. Patient can not afford to pay out of pocket for this medication Anette Guarneri). I went ahead and sent in 90 days so that patient could get her medication.

## 2016-12-07 ENCOUNTER — Ambulatory Visit (INDEPENDENT_AMBULATORY_CARE_PROVIDER_SITE_OTHER): Payer: Medicare Other | Admitting: Psychiatry

## 2016-12-07 DIAGNOSIS — F419 Anxiety disorder, unspecified: Secondary | ICD-10-CM

## 2016-12-07 DIAGNOSIS — F3131 Bipolar disorder, current episode depressed, mild: Secondary | ICD-10-CM

## 2016-12-08 NOTE — Progress Notes (Addendum)
   THERAPIST PROGRESS NOTE  Session Time: 3:05-4:00  Participation Level: Active  Behavioral Response: CasualAlertAnxious  Type of Therapy: Individual Therapy  Treatment Goals addressed: Anxiety  Interventions: Strength-based and Supportive  Summary: Sherry Strong is a 56 y.o. female who presents with anxiety; insomnia.   Suicidal/Homicidal: Nowithout intent/plan  Therapist Response: Pt. Continues to present as anxious and complains of insomnia. Pt. Reports that her primary care provider did not begin taper of xanax and Pt. Was encouraged not to do so without plan from primary care and psychiatrist. Pt. Reports that she continues to take xanax during the day as needed, but would like to stop taking it at night if it would help to address her insomnia. Pt. Is taking melatonin and ashwaganda to help with insomnia and stress response and discussed with primary care. Session focused on thoughts that are contributing to Pt.'s anxiety and insomna which are related to her fears of her mother dying and belief that it will be difficult to find another job in the absence of caregiving for her mother, and as a result she would not be able to pay her bills and care for herself.  Significant part of session focused on life review and Pt.'s history in relationships and career. Pt. Was able to develop with assistance of counselor My past experience tell me 1) I can do hard things, 2) I am a problem solver, 3) I can face challenges without fear, 4) I have made hard decisions that have worked to my benefit in the past, 5) I am a survivor. Pt. Was able to identify as it relates to her career I am 1) resourceful, 2) I am creative, 3) I am motivated by challenging situations, 4) I am compassionate. Pt. Was encouraged to use these reframes daily to assist with lowering her anxiety.   Plan: Return again in 2-3 weeks.  Diagnosis:      Axis I: Anxiety Disorder NOS                          Axis II: No  diagnosis    [name], [facility] 12/08/2016

## 2016-12-27 ENCOUNTER — Ambulatory Visit (INDEPENDENT_AMBULATORY_CARE_PROVIDER_SITE_OTHER): Payer: Medicare Other | Admitting: Psychiatry

## 2016-12-27 DIAGNOSIS — F3131 Bipolar disorder, current episode depressed, mild: Secondary | ICD-10-CM

## 2016-12-27 NOTE — Progress Notes (Signed)
     THERAPIST PROGRESS NOTE  Session Time: 3:45-4:45  Participation Level:Active  Behavioral Response:CasualAlertAnxious  Type of Therapy: Individual Therapy  Treatment Goals addressed: Anxiety  Interventions:Strength-based and Supportive  Summary: Sherry Strong [id number] a 56 y.o.femalewho presents with anxiety; insomnia.   Suicidal/Homicidal:Nowithout intent/plan  Therapist Response: Pt. Continues to present as anxious and complains of insomnia. Counselor and pt. Completed initial treatment plan. Counselor reviewed grounding series and developed mantras "I am not stuck" "small steps matter" and "I always have a choice". Pt. Focused on stress as caregiver of 12 year old mother with dementia and feeling inadequate support from her brother. Pt. Reports that she had allergic reaction from vistaril that required medical attention and need to stop taking it immediately. Pt. Reports that her sleep is getting better and developed awareness that her hot flashes have been significantly interrupting her sleep. Pt. Was given instruction on use of the grounding series and instruction on how to use to manage anxiety and to facilitate sleep at bedtime. Pt. Discussed cognitive distortions regarding her employability, blocks toward developing motivation to accomplish personal and career tasks, and about her body image.   Plan: Return again in 1 week.  Diagnosis:Axis I:Anxiety Disorder NOS  Axis II:No diagnosis   Sherry Strong, [facility] 12/27/2016

## 2017-01-02 ENCOUNTER — Other Ambulatory Visit (HOSPITAL_COMMUNITY): Payer: Self-pay | Admitting: Psychiatry

## 2017-01-02 DIAGNOSIS — F3131 Bipolar disorder, current episode depressed, mild: Secondary | ICD-10-CM

## 2017-01-03 ENCOUNTER — Ambulatory Visit (HOSPITAL_COMMUNITY): Payer: Self-pay | Admitting: Psychiatry

## 2017-01-09 ENCOUNTER — Ambulatory Visit (HOSPITAL_COMMUNITY): Payer: Self-pay | Admitting: Psychiatry

## 2017-01-10 ENCOUNTER — Ambulatory Visit (HOSPITAL_COMMUNITY): Payer: Self-pay | Admitting: Psychiatry

## 2017-01-16 ENCOUNTER — Encounter (HOSPITAL_COMMUNITY): Payer: Self-pay | Admitting: Psychiatry

## 2017-01-16 ENCOUNTER — Ambulatory Visit (INDEPENDENT_AMBULATORY_CARE_PROVIDER_SITE_OTHER): Payer: Medicare Other | Admitting: Psychiatry

## 2017-01-16 DIAGNOSIS — F410 Panic disorder [episodic paroxysmal anxiety] without agoraphobia: Secondary | ICD-10-CM

## 2017-01-16 DIAGNOSIS — Z79899 Other long term (current) drug therapy: Secondary | ICD-10-CM | POA: Diagnosis not present

## 2017-01-16 DIAGNOSIS — F419 Anxiety disorder, unspecified: Secondary | ICD-10-CM

## 2017-01-16 DIAGNOSIS — F3131 Bipolar disorder, current episode depressed, mild: Secondary | ICD-10-CM

## 2017-01-16 MED ORDER — LAMOTRIGINE 200 MG PO TABS: 200.0000 mg | ORAL_TABLET | Freq: Every day | ORAL | 0 refills | Status: DC

## 2017-01-16 MED ORDER — LURASIDONE HCL 120 MG PO TABS: 120.0000 mg | ORAL_TABLET | Freq: Every day | ORAL | 0 refills | Status: DC
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
## 2017-01-17 ENCOUNTER — Ambulatory Visit (INDEPENDENT_AMBULATORY_CARE_PROVIDER_SITE_OTHER): Payer: Medicare Other | Admitting: Psychiatry

## 2017-01-17 DIAGNOSIS — F3131 Bipolar disorder, current episode depressed, mild: Secondary | ICD-10-CM | POA: Diagnosis not present

## 2017-01-18 NOTE — Progress Notes (Signed)
   THERAPIST PROGRESS NOTE  Session Time: 3:35-4:30  Participation Level:Active  Behavioral Response:CasualAlertAnxious  Type of Therapy: Individual Therapy  Treatment Goals addressed: Anxiety  Interventions:Strength-based and Supportive  Summary: Sherry Strong [id number] a 56y.o.femalewho presents with anxiety; insomnia.   Suicidal/Homicidal:Nowithout intent/plan  Therapist Response: Pt. Continues to present as talkative and engaged in the therapeutic process. Pt. 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 Counselor provided education on the need for support and discussed resources for group support for caregivers. Pt. Discussed her reluctance to discuss needs for support with her brother and her boyfriend because they do not understand the emotional and physical demands of daily caregiving. Pt. Also discussed impatience/low frustration tolerance with her significant other regarding his needs for conversation and connection. Counselor introduced love languages and processing about differences in how she and her significant other get their emotional needs met. Counselor introduced cognitive modeling CBFAR-circumstance, belief, feeling, action, result and worked through unintentional and intentional models. Pt. Worked through circumstance of observing the physical condition of her home which triggers thought that she should be doing more, and feeling overwhelmed, which causes her to respond by being "snippy" with her boyfriend, which results in her disconnecting with her boyfriend. Pt. Was given homework to try working on a thought model on her own.   Plan: Return again in 3 weeks.  Diagnosis:Axis I:Anxiety Disorder NOS  Axis II:No diagnosis   Sherry Strong, [facility] 01/18/2017

## 2017-01-23 ENCOUNTER — Ambulatory Visit (HOSPITAL_COMMUNITY): Payer: Self-pay | Admitting: Psychiatry

## 2017-02-04 ENCOUNTER — Other Ambulatory Visit (HOSPITAL_COMMUNITY): Payer: Self-pay | Admitting: Psychiatry

## 2017-02-04 DIAGNOSIS — F3131 Bipolar disorder, current episode depressed, mild: Secondary | ICD-10-CM

## 2017-02-27 ENCOUNTER — Telehealth (HOSPITAL_COMMUNITY): Payer: Self-pay | Admitting: Psychiatry

## 2017-02-27 ENCOUNTER — Ambulatory Visit (INDEPENDENT_AMBULATORY_CARE_PROVIDER_SITE_OTHER): Payer: Medicare Other | Admitting: Psychiatry

## 2017-02-27 DIAGNOSIS — F3131 Bipolar disorder, current episode depressed, mild: Secondary | ICD-10-CM | POA: Diagnosis not present

## 2017-03-01 ENCOUNTER — Ambulatory Visit (HOSPITAL_COMMUNITY): Payer: Self-pay | Admitting: Psychiatry

## 2017-03-01 ENCOUNTER — Telehealth (HOSPITAL_COMMUNITY): Payer: Self-pay | Admitting: Psychiatry

## 2017-03-05 NOTE — Progress Notes (Signed)
   THERAPIST PROGRESS NOTE    Session Time: 3:35-4:30  Participation Level:Active  Behavioral Response:CasualAlertAnxious  Type of Therapy: Individual Therapy  Treatment Goals addressed: Anxiety  Interventions:Strength-based and Supportive  Summary: Sherry Strong [id number] a 56y.o.femalewho presents with anxiety; insomnia.   Suicidal/Homicidal:Nowithout intent/plan  Therapist Response: Pt. Continues to present as mildly anxious, engaged in the therapeutic process. Pt. Expressed her frustration with scheduling an appointment every two weeks. Counselor apologized for changes in scheduling due to family medical concerns. Counselor offered to make referral or to set Pt. Up with once a week group with Charolotte Eke for the next 5 weeks until schedule changes back. Pt. Consented to starting once a week therapy group. Pt. Discussed that she is concerned about her finances, concerns about her boyfriend's mental health and depression, challenges of grocery shopping and meal planning, and needing time off from taking care of her mother who has dementia. Pt. Was encouraged to reinitiate communication regarding shopping and meal planning. Pt. Was able to identify that she enjoys the creative process of planning a meal, shopping and preparing the meal, but this process has become overwhelming for her because her boyfriend does the shopping and she is expected to cook a meal with no plan and is not consulted with shopping. The result is that she does not enjoy cooking and spends too much money on eating out which is not healthy for her. Pt. Also discussed that she has problems setting boundaries with him when he brings food home to her and is afraid of hurting his feelings is she refuses the food. Pt. Participated in problem solving and was open to solution of taking th snacks to her mother who enjoys them so that they will not be wasted. Pt. Agreed that this would work for her and he may get the  message to stop bringing the snacks. Pt. Was affirmed for wearing exercise clothes and for initiating exercise plan of walking in the evenings.   Plan: Return again in 3 weeks.  Diagnosis:Axis I:Anxiety Disorder NOS  Axis II:No diagnosis    [name], [facility] 03/05/2017

## 2017-03-07 ENCOUNTER — Ambulatory Visit (HOSPITAL_COMMUNITY): Payer: Self-pay | Admitting: Licensed Clinical Social Worker

## 2017-03-15 ENCOUNTER — Ambulatory Visit (HOSPITAL_COMMUNITY): Payer: Self-pay | Admitting: Psychiatry

## 2017-03-29 ENCOUNTER — Ambulatory Visit (HOSPITAL_COMMUNITY): Payer: Self-pay | Admitting: Psychiatry

## 2017-04-04 ENCOUNTER — Ambulatory Visit (INDEPENDENT_AMBULATORY_CARE_PROVIDER_SITE_OTHER): Payer: Medicare Other | Admitting: Psychiatry

## 2017-04-04 DIAGNOSIS — F3131 Bipolar disorder, current episode depressed, mild: Secondary | ICD-10-CM | POA: Diagnosis not present

## 2017-04-05 NOTE — Progress Notes (Signed)
   THERAPIST PROGRESS NOTE   Session Time: 1:05-2:00  Participation Level:Active  Behavioral Response:CasualAlertAnxious  Type of Therapy: Individual Therapy  Treatment Goals addressed: Anxiety  Interventions:Strength-based and Supportive  Summary: Robertta Halfhill IRSWNIOEV a 56y.o.femalewho presents with anxiety; insomnia.   Suicidal/Homicidal:Nowithout intent/plan  Therapist Response: Pt. Continues to present as mildly anxious, engaged in the therapeutic process. Pt. Has made significant progress with her self-esteem and self-image compared to first session. Pt. Stated that she has reduced her dependence on klonopin and that she rarely needs it to go to public places as before. Pt. Reports that she is comfortable going to the gym and wearing exercise attire and that she has been putting more effort into her dress and physical appearance. Pt. Also discussed that she has made several successful connections with girl friends and has made socializing with her friends a priority. Pt. Expressed her frustration with her boyfriend who she believes to be depressed and is isolating. Pt. Expressed her disappointment that he does not enjoy outings and traveling anymore. Pt. Acknowledges that she cannot change him and that he is resistant to seeking help for his anxiety and depression. Pt. Also expressed that she is capable of getting her social engagement needs without him. Pt. Continues to experience considerable stress that she attributes to caregiving of her mother with dementia. Counselor validated the caregiving stress that Pt. Is experiencing and encouraged her to continue seeking social support and physical exercise and nutrition as self-care.   Plan: Return again in 3weeks.  Diagnosis:Axis I:Anxiety Disorder NOS  Axis II:No diagnosis   [name], [facility] 04/05/2017

## 2017-04-12 ENCOUNTER — Ambulatory Visit (HOSPITAL_COMMUNITY): Payer: Self-pay | Admitting: Psychiatry

## 2017-04-17 ENCOUNTER — Ambulatory Visit (HOSPITAL_COMMUNITY): Payer: Self-pay | Admitting: Psychiatry

## 2017-04-17 ENCOUNTER — Other Ambulatory Visit (HOSPITAL_COMMUNITY): Payer: Self-pay | Admitting: Psychiatry

## 2017-04-17 DIAGNOSIS — F3131 Bipolar disorder, current episode depressed, mild: Secondary | ICD-10-CM

## 2017-04-19 ENCOUNTER — Ambulatory Visit (INDEPENDENT_AMBULATORY_CARE_PROVIDER_SITE_OTHER): Payer: Medicare Other | Admitting: Psychiatry

## 2017-04-19 DIAGNOSIS — F3131 Bipolar disorder, current episode depressed, mild: Secondary | ICD-10-CM | POA: Diagnosis not present

## 2017-04-23 NOTE — Progress Notes (Signed)
   THERAPIST PROGRESS NOTE   Session Time: 3:05-4:00  Participation Level:Active  Behavioral Response:CasualAlertMildlyAnxious  Type of Therapy: Individual Therapy  Treatment Goals addressed: Anxiety  Interventions:Strength-based and Supportive  Summary: Rosine Solecki [id number] a 56y.o.femalewho presents with anxiety; insomnia.   Suicidal/Homicidal:Nowithout intent/plan  Therapist Response: Pt. Continues to present as mildly anxious. Pt. Also continues to present with significant improvement in her self-esteem and self-image. Pt. Discussed her concerns about her home life, finances, and her boyfriend whom she believes to have a significant problem with depression and anxiety. Pt. Has moved forward with her commitment to travel and discussed plans for upcoming trip with her girlfriends. Pt. Discussed that she has taken responsibility for driving for the trip and discussed her concern that because her girlfriends enjoy staying up all night that her sleep schedule will be disrupted and will cause her anxiety while she drives. Significant time in session discussing the importance of self-care boundaries and how Pt. Can balance having fun with her friends and maintaining a sleep schedule that is good for her. Pt. Agreed that she would be able to state her needs especially on night before they leave the trip when she will be responsible for the driving. Pt. Discussed this pattern in another friend relationship about worrying about displeasing a friend due to a need to be consistent with her personal needs. Session focused on developing of self-awareness of physical needs and how to communicate those needs to significant others.    Plan: Return again in 3weeks.  Diagnosis:Axis I:Anxiety Disorder NOS  Axis II:No diagnosis  [name], [facility] 04/23/2017

## 2017-04-24 ENCOUNTER — Other Ambulatory Visit (HOSPITAL_COMMUNITY): Payer: Self-pay | Admitting: Psychiatry

## 2017-04-24 ENCOUNTER — Other Ambulatory Visit (HOSPITAL_COMMUNITY): Payer: Self-pay

## 2017-04-24 DIAGNOSIS — F3131 Bipolar disorder, current episode depressed, mild: Secondary | ICD-10-CM

## 2017-04-24 MED ORDER — LURASIDONE HCL 120 MG PO TABS: 120.0000 mg | ORAL_TABLET | Freq: Every day | ORAL | 0 refills | Status: DC

## 2017-04-24 NOTE — Progress Notes (Signed)
Patient called for a refill on Latuda 120 mg. She has an appointment on 8/7, however she is out now. Patient has been here in the last 3 months and has a follow up appointment. Per protocol, I sent a 90 day order in to the pharmacy (her insurance will not cover a 30 day supply). Called patient and made her aware.

## 2017-04-26 ENCOUNTER — Ambulatory Visit (HOSPITAL_COMMUNITY): Payer: Medicare Other | Admitting: Psychiatry

## 2017-04-30 ENCOUNTER — Telehealth (HOSPITAL_COMMUNITY): Payer: Self-pay | Admitting: Psychiatry

## 2017-04-30 ENCOUNTER — Other Ambulatory Visit (HOSPITAL_COMMUNITY): Payer: Self-pay | Admitting: Psychiatry

## 2017-05-01 ENCOUNTER — Encounter (HOSPITAL_COMMUNITY): Payer: Self-pay | Admitting: Psychiatry

## 2017-05-01 ENCOUNTER — Ambulatory Visit (INDEPENDENT_AMBULATORY_CARE_PROVIDER_SITE_OTHER): Payer: Medicare Other | Admitting: Psychiatry

## 2017-05-01 DIAGNOSIS — F3131 Bipolar disorder, current episode depressed, mild: Secondary | ICD-10-CM

## 2017-05-01 MED ORDER — LURASIDONE HCL 120 MG PO TABS: 120.0000 mg | ORAL_TABLET | Freq: Every day | ORAL | 0 refills | Status: DC

## 2017-05-01 MED ORDER — LAMOTRIGINE 200 MG PO TABS: 200.0000 mg | ORAL_TABLET | Freq: Every day | ORAL | 0 refills | Status: DC
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
## 2017-05-09 ENCOUNTER — Ambulatory Visit (INDEPENDENT_AMBULATORY_CARE_PROVIDER_SITE_OTHER): Payer: Medicare Other | Admitting: Psychiatry

## 2017-05-09 DIAGNOSIS — F3130 Bipolar disorder, current episode depressed, mild or moderate severity, unspecified: Secondary | ICD-10-CM | POA: Diagnosis not present

## 2017-05-09 DIAGNOSIS — F3131 Bipolar disorder, current episode depressed, mild: Secondary | ICD-10-CM

## 2017-05-16 NOTE — Progress Notes (Signed)
   THERAPIST PROGRESS NOTE  Session Time: 3:35-4:40  Participation Level:Active  Behavioral Response:CasualAlertAnxious  Type of Therapy: Individual Therapy  Treatment Goals addressed: Anxiety  Interventions:Strength-based and Supportive  Summary: Sherry Strong [id number] a 56y.o.femalewho presents with anxiety; insomnia.   Suicidal/Homicidal:Nowithout intent/plan  Therapist Response: Pt. Continues to present as anxious. Pt. Reports frustration that her symptoms do not seem to be getting better. Pt. Discussed that during recent visit with the psychiatrist her symptoms were described as "mood swings" which Pt. Disagrees that she has history of mood swings and does not believe that she is on correct medication and is not working for her. Pt. Discussed number of behavioral changes that have been recommended for her including breathing and meditation exercises that Pt. States that she tries to do consistently but that they have not worked for her. Significant part of session was focused on significant environmental stressors which include daily full-time caregiving for mother with dementia, living in home that she is unhappy, partner who is not active and does not engage with her due emotional problems, financial stress, and overwhelming feeling of being "trapped" by numerous stressors. Counselor discussed the dilemma of seeing a significant change with medication and coping behaviors as long as Pt. Does not begin to make active changes in her lifestyle and stressful living environment. Pt. Was encouraged to begin process of reversing pattern of all-or-nothing things by working on unpacking one box each week of the 30-40 boxes that fill up her home with the belongings from move that occurred several years ago. Pt. Committed to beginning to work on the process of resolving the unfinished business of the move and getting rid of personal items and moving forward on long-term goal of completing  her home.   Plan: Return again in 2-3weeks. Continue with CBT based therapy.  Diagnosis:Axis I:Anxiety Disorder NOS  Axis II:No diagnosis    [name], [facility] 05/16/2017

## 2017-05-23 ENCOUNTER — Ambulatory Visit (INDEPENDENT_AMBULATORY_CARE_PROVIDER_SITE_OTHER): Payer: Medicare Other | Admitting: Psychiatry

## 2017-05-23 DIAGNOSIS — F3131 Bipolar disorder, current episode depressed, mild: Secondary | ICD-10-CM | POA: Diagnosis not present

## 2017-05-29 ENCOUNTER — Ambulatory Visit (HOSPITAL_COMMUNITY): Payer: Self-pay | Admitting: Psychiatry

## 2017-06-01 NOTE — Progress Notes (Signed)
   THERAPIST PROGRESS NOTE  Session Time: 3:35-4:30  Participation Level:Active  Behavioral Response:CasualAlertMildlyAnxious  Type of Therapy: Individual Therapy  Treatment Goals addressed: Anxiety  Interventions:Strength-based and Supportive  Summary: Sherry Strong [id number] a 56y.o.femalewho presents with anxiety; insomnia.   Suicidal/Homicidal:Nowithout intent/plan  Therapist Response: Pt. Presents as mildly anxious. Pt. Discussed that her father packed his home and moved to [place] this week. Pt. Discussed that her father's actions were very hurtful because he did not give her an opportunity to see him before leaving and she fears that because of his health and her finances that she will not see him again. Pt. Discussed poor relationship history with her father, never feeling his emotional support since he left the marriage and remarried when she was in high school. Significant time in session was spent processing Pt.'s relationship with her father and how this relationship contributed to feelings of insecurity in her adolescent years and relationship to her current anxiety. Pt. Discussed that she was able to make some progress in her all-or-nothing thinking in working through clearing out her car so that she can make room for clearing the boxes in her home.   Plan: Return again in 2-3weeks. Continue with CBT based therapy.  Diagnosis:Axis I:Anxiety Disorder NOS  Axis II:No diagnosis   [name], [facility] 06/01/2017

## 2017-06-18 ENCOUNTER — Ambulatory Visit (INDEPENDENT_AMBULATORY_CARE_PROVIDER_SITE_OTHER): Payer: Self-pay | Admitting: Psychiatry

## 2017-06-18 DIAGNOSIS — F3131 Bipolar disorder, current episode depressed, mild: Secondary | ICD-10-CM

## 2017-06-19 ENCOUNTER — Encounter (INDEPENDENT_AMBULATORY_CARE_PROVIDER_SITE_OTHER): Payer: Self-pay
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
## 2017-06-25 ENCOUNTER — Ambulatory Visit (HOSPITAL_COMMUNITY): Payer: Self-pay | Admitting: Psychiatry

## 2017-07-09 ENCOUNTER — Ambulatory Visit (HOSPITAL_COMMUNITY): Payer: Self-pay | Admitting: Psychiatry

## 2017-07-12 ENCOUNTER — Encounter (INDEPENDENT_AMBULATORY_CARE_PROVIDER_SITE_OTHER): Payer: Self-pay | Admitting: Family Medicine

## 2017-07-12 ENCOUNTER — Ambulatory Visit (INDEPENDENT_AMBULATORY_CARE_PROVIDER_SITE_OTHER): Payer: Medicare Other | Admitting: Family Medicine

## 2017-07-12 VITALS — BP 119/83 | HR 65 | Temp 98.1°F | Ht 64.0 in | Wt 206.0 lb

## 2017-07-12 DIAGNOSIS — Z1331 Encounter for screening for depression: Secondary | ICD-10-CM | POA: Diagnosis not present

## 2017-07-12 DIAGNOSIS — E7849 Other hyperlipidemia: Secondary | ICD-10-CM | POA: Diagnosis not present

## 2017-07-12 DIAGNOSIS — R0602 Shortness of breath: Secondary | ICD-10-CM | POA: Diagnosis not present

## 2017-07-12 DIAGNOSIS — Z6835 Body mass index (BMI) 35.0-35.9, adult: Secondary | ICD-10-CM | POA: Diagnosis not present

## 2017-07-12 DIAGNOSIS — I10 Essential (primary) hypertension: Secondary | ICD-10-CM | POA: Diagnosis not present

## 2017-07-12 DIAGNOSIS — R5383 Other fatigue: Secondary | ICD-10-CM | POA: Insufficient documentation

## 2017-07-12 DIAGNOSIS — E559 Vitamin D deficiency, unspecified: Secondary | ICD-10-CM

## 2017-07-12 DIAGNOSIS — E538 Deficiency of other specified B group vitamins: Secondary | ICD-10-CM | POA: Diagnosis not present

## 2017-07-12 DIAGNOSIS — Z0289 Encounter for other administrative examinations: Secondary | ICD-10-CM

## 2017-07-12 DIAGNOSIS — R011 Cardiac murmur, unspecified: Secondary | ICD-10-CM

## 2017-07-12 DIAGNOSIS — F3130 Bipolar disorder, current episode depressed, mild or moderate severity, unspecified: Secondary | ICD-10-CM | POA: Diagnosis not present

## 2017-07-12 DIAGNOSIS — F319 Bipolar disorder, unspecified: Secondary | ICD-10-CM

## 2017-07-12 NOTE — Progress Notes (Signed)
.  Office: [phone]  /  Fax: [phone]   HPI:   Chief Complaint: OBESITY  Sherry Strong (MR# [id number]) is a 56 y.o. female who presents on 07/12/2017 for obesity evaluation and treatment. Current BMI is Body mass index is 35.36 kg/m.Sherry Strong has struggled with obesity for years and has been unsuccessful in either losing weight or maintaining long term weight loss. Sherry Strong attended our information session and states she is currently in the action stage of change and ready to dedicate time achieving and maintaining a healthier weight.  Sherry Strong states her family eats meals together she thinks her family will eat healthier with  her she struggles with family and or coworkers weight loss sabotage her desired weight loss is 67 lbs she started gaining weight in the last 2 yrs her heaviest weight ever was 207 lbs. she has significant food cravings issues  she snacks frequently in the evenings she skips meals frequently she is frequently drinking liquids with calories she frequently makes poor food choices she has problems with excessive hunger  she frequently eats larger portions than normal  she has binge eating behaviors she struggles with emotional eating    Sherry Strong feels her energy is lower than it should be. This has worsened with weight gain and has not worsened recently. [name] admits to daytime somnolence and admits to waking up still tired. Patient is at risk for obstructive sleep apnea. Patent has a history of symptoms of daytime Sherry and morning Sherry. Patient generally gets 5 or 6 hours of sleep per night, and states they generally have restless sleep. Snoring is present. Apneic episodes are present. Epworth Sleepiness Score is 4  Dyspnea on exertion Sherry Strong notes increasing shortness of breath with exercising and seems to be worsening over time with weight gain. She notes getting out of breath sooner with activity than she used to. This has not gotten worse recently. Sherry Strong  denies orthopnea.  Vitamin D deficiency Sherry Strong has a diagnosis of vitamin D deficiency. She is not currently taking vit D and admits Sherry but denies nausea, vomiting or muscle weakness.  Hyperglycemia Sherry Strong has no history of diabetes but is on antipsychotics and has a positive family history of diabetes. She denies polyphagia.  Bipolar Depression Sherry Strong has a diagnosis of bipolar depression and feels life isn't worth living approximately 50% of the time. She shows no sign of suicidal or homicidal ideations currently and she has no history of suicide attempts. Sherry Strong struggles with emotional eating and using food for comfort to the extent that it is negatively impacting her health. She often snacks when she is not hungry. Sherry Strong sometimes feels she is out of control and then feels guilty that she made poor food choices. She has been working on behavior modification techniques to help reduce her emotional eating and has been somewhat successful.  Vitamin B12 Deficiency Sherry Strong has a diagnosis of B12 insufficiency and notes Sherry. This is not a new diagnosis. Sherry Strong is not a vegetarian and does not have a previous diagnosis of pernicious anemia. She does not have a history of weight loss surgery. Sherry Strong is on OTC vitamin B12 supplement.  Hyperlipidemia Sherry Strong has hyperlipidemia and has been trying to improve her cholesterol levels with intensive lifestyle modification including a low saturated fat diet, exercise and weight loss. She is currently on pravastatin and fenofibrate and she denies any chest pain, claudication or myalgias.  Hypertension Sherry Strong is a 56 y.o. female with hypertension. Sherry Strong denies  chest pain or headache. She is working weight loss to help control her blood pressure with the goal of decreasing her risk of heart attack and stroke. Sherry Strong blood pressure is currently stable on metoprolol.  New Onset Heart Murmur Sherry Strong has a diagnosis of new onset heart murmur and denies chest  pain but admits to shortness of breath with activity. She has trace edema bilateral lower extremities and hypertension.   Depression Screen Sherry Strong's Food and Mood (modified PHQ-9) score was  Depression screen PHQ 2/9 07/12/2017  Decreased Interest 3  Down, Depressed, Hopeless 3  PHQ - 2 Score 6  Altered sleeping 3  Tired, decreased energy 3  Change in appetite 3  Feeling bad or failure about yourself  2  Trouble concentrating 3  Moving slowly or fidgety/restless 3  Suicidal thoughts 2  PHQ-9 Score 25  Difficult doing work/chores Extremely dIfficult    ALLERGIES: Allergies  Allergen Reactions  . Vistaril  [Hydroxyzine Hcl] Swelling    MEDICATIONS: Current Outpatient Prescriptions on File Prior to Visit  Medication Sig Dispense Refill  . ALPRAZolam (XANAX) 0.5 MG tablet Take 0.5 mg by mouth 2 (two) times daily as needed. For anxiety    . aspirin EC 81 MG tablet Take 81 mg by mouth.    . lamoTRIgine (LAMICTAL) 200 MG tablet Take 1 tablet (200 mg total) by mouth daily. 90 tablet 0  . metoprolol succinate (TOPROL-XL) 25 MG 24 hr tablet Take 25 mg by mouth daily.     . pantoprazole (PROTONIX) 40 MG tablet Take 40 mg by mouth daily.    . pravastatin (PRAVACHOL) 40 MG tablet Take 40 mg by mouth every evening.     No current facility-administered medications on file prior to visit.     PAST MEDICAL HISTORY: Past Medical History:  Diagnosis Date  . Anxiety   . Bipolar disorder ([place])   . Depression   . GERD (gastroesophageal reflux disease)   . HLD (hyperlipidemia)   . HTN (hypertension)   . Infertility, female   . Menopause     PAST SURGICAL HISTORY: Past Surgical History:  Procedure Laterality Date  . CESAREAN SECTION      SOCIAL HISTORY: Social History  Substance Use Topics  . Smoking status: Never Smoker  . Smokeless tobacco: Never Used  . Alcohol use 3.0 - 3.6 oz/week    5 - 6 Cans of beer per week     Comment: Friday nights    FAMILY HISTORY: Family  History  Problem Relation Age of Onset  . Heart disease Mother   . Depression Mother   . Hyperlipidemia Father   . Hypertension Father   . Heart disease Father   . Cancer Father   . Depression Father   . Anxiety disorder Father   . Sleep apnea Father     ROS: Review of Systems  Constitutional: Positive for malaise/Sherry.  HENT: Positive for congestion (nasal stuffiness), sinus pain and tinnitus.   Eyes:       Wear Glasses or Contacts  Respiratory: Positive for shortness of breath (on exertion).   Cardiovascular: Negative for chest pain and claudication.  Gastrointestinal: Positive for heartburn. Negative for nausea and vomiting.  Genitourinary: Positive for frequency.  Musculoskeletal: Negative for myalgias.       Negative muscle weakness  Skin:       Dryness Hair or Nail Changes  Neurological: Positive for dizziness. Negative for headaches.  Endo/Heme/Allergies:       Heat / Cold Intolerance Negative  polyphagia  Psychiatric/Behavioral: Positive for depression. Negative for suicidal ideas. The patient is nervous/anxious (nervousness) and has insomnia.        Stress     PHYSICAL EXAM: Blood pressure 119/83, pulse 65, temperature 98.1 F (36.7 C), temperature source Oral, height 5\' 4"  (1.626 m), weight 206 lb (93.4 kg), SpO2 99 %. Body mass index is 35.36 kg/m. Physical Exam  Constitutional: She is oriented to person, place, and time. She appears well-developed and well-nourished.  Cardiovascular:  Murmur (Grade 1/6 early systolic murmur) heard. Pulmonary/Chest: Effort normal.  Musculoskeletal: Normal range of motion. She exhibits edema (trace edema bilateral lower extremities).  Neurological: She is oriented to person, place, and time.  Skin: Skin is warm and dry.  Psychiatric: She has a normal mood and affect. Her behavior is normal.  Vitals reviewed.   RECENT LABS AND TESTS: BMET No results found for: NA, K, CL, CO2, GLUCOSE, BUN, CREATININE, CALCIUM,  GFRNONAA, GFRAA No results found for: HGBA1C No results found for: INSULIN CBC No results found for: WBC, RBC, HGB, HCT, PLT, MCV, MCH, MCHC, RDW, LYMPHSABS, MONOABS, EOSABS, BASOSABS Iron/TIBC/Ferritin/ %Sat No results found for: IRON, TIBC, FERRITIN, IRONPCTSAT Lipid Panel  No results found for: CHOL, TRIG, HDL, CHOLHDL, VLDL, LDLCALC, LDLDIRECT Hepatic Function Panel  No results found for: PROT, ALBUMIN, AST, ALT, ALKPHOS, BILITOT, BILIDIR, IBILI No results found for: TSH  ECG  shows NSR with a rate of 70 BPM INDIRECT CALORIMETER done today shows a VO2 of 257 and a REE of 1788. Her calculated basal metabolic rate is 6967 thus her basal metabolic rate is better than expected.    ASSESSMENT AND PLAN: Other Sherry - Plan: EKG 12-Lead, CBC With Differential, Comprehensive metabolic panel, Hemoglobin A1c, Insulin, random, T3, T4, free, TSH  Shortness of breath on exertion  Essential hypertension  Other hyperlipidemia - Plan: Lipid Panel With LDL/HDL Ratio  B12 nutritional deficiency - Plan: Vitamin B12  Bipolar depression (HCC)  Vitamin D deficiency - Plan: VITAMIN D 25 Hydroxy (Vit-D Deficiency, Fractures)  Newly recognized heart murmur  Depression screening  Class 2 severe obesity with serious comorbidity and body mass index (BMI) of 35.0 to 35.9 in adult, unspecified obesity type (HCC)  PLAN:  Sherry Kelli was informed that her Sherry may be related to obesity, depression or many other causes. Labs will be ordered, and in the meanwhile Emanuella has agreed to work on diet, exercise and weight loss to help with Sherry. Proper sleep hygiene was discussed including the need for 7-8 hours of quality sleep each night. A sleep study was not ordered based on symptoms and Epworth score.  Dyspnea on exertion Lizvette's shortness of breath appears to be obesity related and exercise induced. She has agreed to work on weight loss and gradually increase exercise to treat her exercise  induced shortness of breath. If Renie follows our instructions and loses weight without improvement of her shortness of breath, we will plan to refer to pulmonology. We will monitor this condition regularly. Shacoya agrees to this plan.  Vitamin D Deficiency Lauran was informed that low vitamin D levels contributes to Sherry and are associated with obesity, breast, and colon cancer. We will check labs and will follow up for routine testing of vitamin D, at least 2-3 times per year.   Hyperglycemia Fasting labs will be obtained and results with be discussed with Sherry Strong in 2 weeks at her follow up visit. In the meanwhile Monna was started on a lower simple carbohydrate diet and will  work on weight loss efforts.  Bipolar Depression We will monitor Damiyah's mood and subsequent emotional eating closely and she will continue to follow up with her counselor and her psychiatrist.  Vitamin B12 Deficiency Maritza will work on increasing B12 rich foods in her diet. B12 supplementation was not prescribed today. We will check labs and follow.  Hyperlipidemia Tiffiney was informed of the American Heart Association Guidelines emphasizing intensive lifestyle modifications as the first line treatment for hyperlipidemia. We discussed many lifestyle modifications today in depth, and Joetta will continue to work on decreasing saturated fats such as fatty red meat, butter and many fried foods. She will also increase vegetables and lean protein in her diet and continue to work on exercise and weight loss efforts. We will check labs and Verl will continue her medications as prescribed.  Hypertension We discussed sodium restriction, working on healthy weight loss, and a regular exercise program as the means to achieve improved blood pressure control. Bunny agreed with this plan and agreed to follow up as directed. We will continue to monitor her blood pressure as well as her progress with the above lifestyle modifications. She will continue  her medications as prescribed and will watch for signs of hypotension as she continues her lifestyle modifications.  New Onset Heart Murmur We will refer to [place] for echocardiogram and will follow.  Depression Screen Ketzaly had a strongly positive depression screening. Depression is commonly associated with obesity and often results in emotional eating behaviors. We will monitor this closely and work on CBT to help improve the non-hunger eating patterns. Referral to Psychology may be required if no improvement is seen as she continues in our clinic.  Obesity Delrose is currently in the action stage of change and her goal is to continue with weight loss efforts She has agreed to follow the Category 2 plan +100 calories Dalaya has been instructed to work up to a goal of 150 minutes of combined cardio and strengthening exercise per week for weight loss and overall health benefits. We discussed the following Behavioral Modification Strategies today: increasing lean protein intake, decreasing simple carbohydrates  and work on meal planning and easy cooking plans  Denette has agreed to follow up with our clinic in 2 weeks. She was informed of the importance of frequent follow up visits to maximize her success with intensive lifestyle modifications for her multiple health conditions. She was informed we would discuss her lab results at her next visit unless there is a critical issue that needs to be addressed sooner. Kyleah agreed to keep her next visit at the agreed upon time to discuss these results.  I, Doreene Nest, am acting as transcriptionist for Dennard Nip, MD  I have reviewed the above documentation for accuracy and completeness, and I agree with the above. -Dennard Nip, MD    OBESITY BEHAVIORAL INTERVENTION VISIT  Today's visit was # 1 out of 32.  Starting weight: 206 lbs Starting date: 07/12/17 Today's weight : 206 lbs Today's date:  07/12/2017 Total lbs lost to date: 0 (Patients must lose 7 lbs in the first 6 months to continue with counseling)   ASK: We discussed the diagnosis of obesity with Oakley Orban [id number] today and Chellsea agreed to give [name] permission to discuss obesity behavioral modification therapy today.  ASSESS: Tationa has the diagnosis of obesity and her BMI today is 35.34 Aryn is in the action stage of change   ADVISE: Cornell was educated on the multiple  health risks of obesity as well as the benefit of weight loss to improve her health. She was advised of the need for long term treatment and the importance of lifestyle modifications.  AGREE: Multiple dietary modification options and treatment options were discussed and  Ramesha agreed to follow the Category 2 plan +100 calories We discussed the following Behavioral Modification Strategies today: increasing lean protein intake, decreasing simple carbohydrates  and work on meal planning and easy cooking plans

## 2017-07-13 LAB — LIPID PANEL WITH LDL/HDL RATIO
Cholesterol, Total: 225 mg/dL — ABNORMAL HIGH (ref 100–199)
HDL: 53 mg/dL (ref >39)
LDL Calculated: 151 mg/dL — ABNORMAL HIGH (ref 0–99)
LDl/HDL Ratio: 2.8 ratio (ref 0.0–3.2)
Triglycerides: 105 mg/dL (ref 0–149)
VLDL Cholesterol Cal: 21 mg/dL (ref 5–40)

## 2017-07-13 LAB — CBC WITH DIFFERENTIAL
Basophils Absolute: 0 10*3/uL (ref 0.0–0.2)
Basos: 0 %
EOS (ABSOLUTE): 0.1 10*3/uL (ref 0.0–0.4)
Eos: 1 %
Hematocrit: 40.1 % (ref 34.0–46.6)
Hemoglobin: 13.1 g/dL (ref 11.1–15.9)
Immature Grans (Abs): 0 10*3/uL (ref 0.0–0.1)
Immature Granulocytes: 1 %
Lymphocytes Absolute: 1.5 10*3/uL (ref 0.7–3.1)
Lymphs: 23 %
MCH: 32.3 pg (ref 26.6–33.0)
MCHC: 32.7 g/dL (ref 31.5–35.7)
MCV: 99 fL — ABNORMAL HIGH (ref 79–97)
Monocytes Absolute: 0.5 10*3/uL (ref 0.1–0.9)
Monocytes: 8 %
Neutrophils Absolute: 4.3 10*3/uL (ref 1.4–7.0)
Neutrophils: 67 %
RBC: 4.05 x10E6/uL (ref 3.77–5.28)
RDW: 13.1 % (ref 12.3–15.4)
WBC: 6.5 10*3/uL (ref 3.4–10.8)

## 2017-07-13 LAB — COMPREHENSIVE METABOLIC PANEL
ALT: 50 IU/L — ABNORMAL HIGH (ref 0–32)
AST: 37 IU/L (ref 0–40)
Albumin/Globulin Ratio: 1.9 (ref 1.2–2.2)
Albumin: 4.8 g/dL (ref 3.5–5.5)
Alkaline Phosphatase: 82 IU/L (ref 39–117)
BUN/Creatinine Ratio: 11 (ref 9–23)
BUN: 9 mg/dL (ref 6–24)
Bilirubin Total: 0.2 mg/dL (ref 0.0–1.2)
CO2: 22 mmol/L (ref 20–29)
Calcium: 9.2 mg/dL (ref 8.7–10.2)
Chloride: 102 mmol/L (ref 96–106)
Creatinine, Ser: 0.85 mg/dL (ref 0.57–1.00)
GFR calc Af Amer: 89 mL/min/{1.73_m2} (ref >59)
GFR calc non Af Amer: 77 mL/min/{1.73_m2} (ref >59)
Globulin, Total: 2.5 g/dL (ref 1.5–4.5)
Glucose: 91 mg/dL (ref 65–99)
Potassium: 4.3 mmol/L (ref 3.5–5.2)
Sodium: 142 mmol/L (ref 134–144)
Total Protein: 7.3 g/dL (ref 6.0–8.5)

## 2017-07-13 LAB — HEMOGLOBIN A1C
Est. average glucose Bld gHb Est-mCnc: 108 mg/dL
Hgb A1c MFr Bld: 5.4 % (ref 4.8–5.6)

## 2017-07-13 LAB — T3: T3, Total: 148 ng/dL (ref 71–180)

## 2017-07-13 LAB — T4, FREE: Free T4: 1.25 ng/dL (ref 0.82–1.77)

## 2017-07-13 LAB — VITAMIN D 25 HYDROXY (VIT D DEFICIENCY, FRACTURES): Vit D, 25-Hydroxy: 25.9 ng/mL — ABNORMAL LOW (ref 30.0–100.0)

## 2017-07-13 LAB — TSH: TSH: 2.42 u[IU]/mL (ref 0.450–4.500)

## 2017-07-13 LAB — VITAMIN B12: Vitamin B-12: 965 pg/mL (ref 232–1245)

## 2017-07-13 LAB — INSULIN, RANDOM: INSULIN: 20.6 u[IU]/mL (ref 2.6–24.9)

## 2017-07-23 ENCOUNTER — Ambulatory Visit (HOSPITAL_COMMUNITY): Payer: Self-pay | Admitting: Psychiatry

## 2017-07-26 ENCOUNTER — Ambulatory Visit (INDEPENDENT_AMBULATORY_CARE_PROVIDER_SITE_OTHER): Payer: Medicare Other | Admitting: Family Medicine

## 2017-07-26 VITALS — BP 120/79 | HR 80 | Temp 98.4°F | Ht 64.0 in | Wt 201.0 lb

## 2017-07-26 DIAGNOSIS — E559 Vitamin D deficiency, unspecified: Secondary | ICD-10-CM

## 2017-07-26 DIAGNOSIS — R945 Abnormal results of liver function studies: Secondary | ICD-10-CM

## 2017-07-26 DIAGNOSIS — Z6834 Body mass index (BMI) 34.0-34.9, adult: Secondary | ICD-10-CM | POA: Diagnosis not present

## 2017-07-26 DIAGNOSIS — E8881 Metabolic syndrome: Secondary | ICD-10-CM

## 2017-07-26 DIAGNOSIS — E669 Obesity, unspecified: Secondary | ICD-10-CM

## 2017-07-26 DIAGNOSIS — E88819 Insulin resistance, unspecified: Secondary | ICD-10-CM

## 2017-07-26 DIAGNOSIS — R7989 Other specified abnormal findings of blood chemistry: Secondary | ICD-10-CM

## 2017-07-26 NOTE — Progress Notes (Signed)
Office: [phone]  /  Fax: [phone]   HPI:   Chief Complaint: OBESITY Sherry Strong is here to discuss her progress with her obesity treatment plan. She is on the Category 2 plan +100 calories and is following her eating plan approximately 85 % of the time. She states she is exercising 0 minutes 0 times per week. Sherry Strong has done well with weight loss but often skipped meals while mother was hospitalized. She also struggled with cravings and some polyphagia, especially for the first 2 or 3 days but then this improved. Her weight is 201 lb (91.2 kg) today and has had a weight loss of 5 pounds over a period of 2 weeks since her last visit. She has lost 5 lbs since starting treatment with [name].  Vitamin D deficiency Sherry Strong has a new diagnosis of vitamin D deficiency. She is not currently taking vit D and admits fatigue but denies nausea, vomiting or muscle weakness.  Elevated LFT Sherry Strong has a new dx of elevated ALT. She denies abdominal pain or jaundice and has never been told of any liver problems in the past. She denies excessive alcohol intake.  Insulin Resistance Sherry Strong has a new diagnosis of insulin resistance. She has a normal A1c and glucose but her elevated fasting insulin level >5 and she has polyphagia but this improved with a low simple carbohydrate diet. Although Sherry Strong's blood glucose readings are still under good control, insulin resistance puts her at greater risk of metabolic syndrome and diabetes.  She is not taking metformin currently and continues to work on diet and exercise to decrease risk of diabetes.  ALLERGIES: Allergies  Allergen Reactions  . Vistaril  [Hydroxyzine Hcl] Swelling    MEDICATIONS: Current Outpatient Prescriptions on File Prior to Visit  Medication Sig Dispense Refill  . ALPRAZolam (XANAX) 0.5 MG tablet Take 0.5 mg by mouth 2 (two) times daily as needed. For anxiety    . aspirin EC 81 MG tablet Take 81 mg by mouth.    . Cyanocobalamin (VITAMIN B 12 PO) Take by  mouth.    . Fenofibrate 150 MG CAPS Take 1 capsule by mouth daily.    Marland Kitchen lamoTRIgine (LAMICTAL) 200 MG tablet Take 1 tablet (200 mg total) by mouth daily. 90 tablet 0  . lurasidone (LATUDA) 80 MG TABS tablet Take 80 mg by mouth daily with breakfast.    . metoprolol succinate (TOPROL-XL) 25 MG 24 hr tablet Take 25 mg by mouth daily.     . pantoprazole (PROTONIX) 40 MG tablet Take 40 mg by mouth daily.    . pravastatin (PRAVACHOL) 40 MG tablet Take 40 mg by mouth every evening.    . vitamin C (ASCORBIC ACID) 500 MG tablet Take 500 mg by mouth daily.    Marland Kitchen zolpidem (AMBIEN) 10 MG tablet Take 10 mg by mouth at bedtime.     No current facility-administered medications on file prior to visit.     PAST MEDICAL HISTORY: Past Medical History:  Diagnosis Date  . Anxiety   . Bipolar disorder ([place])   . Depression   . GERD (gastroesophageal reflux disease)   . HLD (hyperlipidemia)   . HTN (hypertension)   . Infertility, female   . Menopause     PAST SURGICAL HISTORY: Past Surgical History:  Procedure Laterality Date  . CESAREAN SECTION      SOCIAL HISTORY: Social History  Substance Use Topics  . Smoking status: Never Smoker  . Smokeless tobacco: Never Used  . Alcohol use 3.0 -  3.6 oz/week    5 - 6 Cans of beer per week     Comment: Friday nights    FAMILY HISTORY: Family History  Problem Relation Age of Onset  . Heart disease Mother   . Depression Mother   . Hyperlipidemia Father   . Hypertension Father   . Heart disease Father   . Cancer Father   . Depression Father   . Anxiety disorder Father   . Sleep apnea Father     ROS: Review of Systems  Constitutional: Positive for malaise/fatigue and weight loss.  Gastrointestinal: Negative for abdominal pain, nausea and vomiting.  Musculoskeletal:       Negative muscle weakness  Skin:       Negative jaundice    PHYSICAL EXAM: Blood pressure 120/79, pulse 80, temperature 98.4 F (36.9 C), temperature source Oral, height  5\' 4"  (1.626 m), weight 201 lb (91.2 kg), SpO2 98 %. Body mass index is 34.5 kg/m. Physical Exam  Constitutional: She is oriented to person, place, and time. She appears well-developed and well-nourished.  Cardiovascular: Normal rate.   Pulmonary/Chest: Effort normal.  Musculoskeletal: Normal range of motion.  Neurological: She is oriented to person, place, and time.  Skin: Skin is warm and dry.  Psychiatric: She has a normal mood and affect. Her behavior is normal.  Vitals reviewed.   RECENT LABS AND TESTS: BMET    Component Value Date/Time   NA 142 07/12/2017 1055   K 4.3 07/12/2017 1055   CL 102 07/12/2017 1055   CO2 22 07/12/2017 1055   GLUCOSE 91 07/12/2017 1055   BUN 9 07/12/2017 1055   CREATININE 0.85 07/12/2017 1055   CALCIUM 9.2 07/12/2017 1055   GFRNONAA 77 07/12/2017 1055   GFRAA 89 07/12/2017 1055   Lab Results  Component Value Date   HGBA1C 5.4 07/12/2017   Lab Results  Component Value Date   INSULIN 20.6 07/12/2017   CBC    Component Value Date/Time   WBC 6.5 07/12/2017 1055   RBC 4.05 07/12/2017 1055   HGB 13.1 07/12/2017 1055   HCT 40.1 07/12/2017 1055   MCV 99 (H) 07/12/2017 1055   MCH 32.3 07/12/2017 1055   MCHC 32.7 07/12/2017 1055   RDW 13.1 07/12/2017 1055   LYMPHSABS 1.5 07/12/2017 1055   EOSABS 0.1 07/12/2017 1055   BASOSABS 0.0 07/12/2017 1055   Iron/TIBC/Ferritin/ %Sat No results found for: IRON, TIBC, FERRITIN, IRONPCTSAT Lipid Panel     Component Value Date/Time   CHOL 225 (H) 07/12/2017 1055   TRIG 105 07/12/2017 1055   HDL 53 07/12/2017 1055   LDLCALC 151 (H) 07/12/2017 1055   Hepatic Function Panel     Component Value Date/Time   PROT 7.3 07/12/2017 1055   ALBUMIN 4.8 07/12/2017 1055   AST 37 07/12/2017 1055   ALT 50 (H) 07/12/2017 1055   ALKPHOS 82 07/12/2017 1055   BILITOT 0.2 07/12/2017 1055      Component Value Date/Time   TSH 2.420 07/12/2017 1055    ASSESSMENT AND PLAN: Vitamin D deficiency - Plan:  Vitamin D, Ergocalciferol, (DRISDOL) 50000 units CAPS capsule  Elevated LFTs  Insulin resistance  Class 1 obesity with serious comorbidity and body mass index (BMI) of 34.0 to 34.9 in adult, unspecified obesity type  PLAN:  Vitamin D Deficiency Sherry Strong was informed that low vitamin D levels contributes to fatigue and are associated with obesity, breast, and colon cancer. She agrees to start to take prescription Vit D @50 ,000 IU every  week #4 with no refills. We will recheck labs in 3 months and will follow up for routine testing of vitamin D, at least 2-3 times per year. She was informed of the risk of over-replacement of vitamin D and agrees to not increase her dose unless he discusses this with [name] first. Sherry Strong agrees to follow up with our clinic in 2 weeks.  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 Sherry Strong was educated on her risk of developing NASH or even liver failure and the only proven treatment for NAFLD was weight loss. Sherry Strong agreed to continue with her weight loss efforts with healthier diet and exercise as an essential part of her treatment plan. We will recheck labs in 3 months and Dave agreed to follow up at the agreed upon time.  Insulin Resistance Sherry Strong will continue to work on weight loss, exercise, and decreasing simple carbohydrates in her diet to help decrease the risk of diabetes. She was informed that eating too many simple carbohydrates or too many calories at one sitting increases the likelihood of GI side effects. We will defer metformin for now. We will recheck labs in 3 months and Sherry Strong agreed to follow up with [name] as directed to monitor her progress.  Obesity Sherry Strong is currently in the action stage of change. As such, her goal is to continue with weight loss efforts She has agreed to follow the Category 2 plan Sherry Strong has been instructed to work up to a goal of 150 minutes of combined cardio  and strengthening exercise per week for weight loss and overall health benefits. We discussed the following Behavioral Modification Strategies today: no skipping meals, better snacking choices, increasing lean protein intake, decreasing simple carbohydrates, work on meal planning and easy cooking plans and emotional eating strategies  Sherry Strong has agreed to follow up with our clinic in 2 weeks. She was informed of the importance of frequent follow up visits to maximize her success with intensive lifestyle modifications for her multiple health conditions.  I, Doreene Nest, am acting as transcriptionist for Dennard Nip, MD  I have reviewed the above documentation for accuracy and completeness, and I agree with the above. -Dennard Nip, MD    OBESITY BEHAVIORAL INTERVENTION VISIT  Today's visit was # 2 out of 36.  Starting weight: 206 lbs Starting date: 07/12/17 Today's weight : 201 lbs Today's date: 07/26/2017 Total lbs lost to date: 5 (Patients must lose 7 lbs in the first 6 months to continue with counseling)   ASK: We discussed the diagnosis of obesity with Sherry Strong [id number] today and Sherry Strong agreed to give [name] permission to discuss obesity behavioral modification therapy today.  ASSESS: Sherry Strong has the diagnosis of obesity and her BMI today is 34.48 Sherry Strong is in the action stage of change   ADVISE: Sherry Strong was educated on the multiple health risks of obesity as well as the benefit of weight loss to improve her health. She was advised of the need for long term treatment and the importance of lifestyle modifications.  AGREE: Multiple dietary modification options and treatment options were discussed and  Sherry Strong agreed to follow the Category 2 plan We discussed the following Behavioral Modification Strategies today: no skipping meals, better snacking choices, increasing lean protein intake, decreasing simple carbohydrates, work on meal planning and easy cooking plans and emotional eating  strategies

## 2017-07-27 ENCOUNTER — Other Ambulatory Visit (HOSPITAL_COMMUNITY): Payer: Self-pay | Admitting: Psychiatry

## 2017-07-27 DIAGNOSIS — F3131 Bipolar disorder, current episode depressed, mild: Secondary | ICD-10-CM

## 2017-08-01 ENCOUNTER — Encounter (HOSPITAL_COMMUNITY): Payer: Self-pay | Admitting: Psychiatry

## 2017-08-01 ENCOUNTER — Ambulatory Visit (INDEPENDENT_AMBULATORY_CARE_PROVIDER_SITE_OTHER): Payer: Medicare Other | Admitting: Psychiatry

## 2017-08-01 VITALS — BP 126/74 | HR 71 | Ht 63.0 in | Wt 205.4 lb

## 2017-08-01 DIAGNOSIS — R454 Irritability and anger: Secondary | ICD-10-CM

## 2017-08-01 DIAGNOSIS — R5383 Other fatigue: Secondary | ICD-10-CM | POA: Diagnosis not present

## 2017-08-01 DIAGNOSIS — Z818 Family history of other mental and behavioral disorders: Secondary | ICD-10-CM

## 2017-08-01 DIAGNOSIS — R4587 Impulsiveness: Secondary | ICD-10-CM

## 2017-08-01 DIAGNOSIS — F3131 Bipolar disorder, current episode depressed, mild: Secondary | ICD-10-CM

## 2017-08-01 MED ORDER — VITAMIN D (ERGOCALCIFEROL) 1.25 MG (50000 UNIT) PO CAPS: 50000.0000 [IU] | ORAL_CAPSULE | ORAL | 0 refills | Status: DC

## 2017-08-01 MED ORDER — LURASIDONE HCL 80 MG PO TABS: 80.0000 mg | ORAL_TABLET | Freq: Every day | ORAL | 1 refills | Status: DC

## 2017-08-01 MED ORDER — LAMOTRIGINE 200 MG PO TABS: 200.0000 mg | ORAL_TABLET | Freq: Every day | ORAL | 0 refills | Status: DC
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
## 2017-08-06 ENCOUNTER — Ambulatory Visit (INDEPENDENT_AMBULATORY_CARE_PROVIDER_SITE_OTHER): Payer: Medicare Other | Admitting: Psychiatry

## 2017-08-06 DIAGNOSIS — F3131 Bipolar disorder, current episode depressed, mild: Secondary | ICD-10-CM

## 2017-08-07 ENCOUNTER — Encounter (INDEPENDENT_AMBULATORY_CARE_PROVIDER_SITE_OTHER): Payer: Self-pay | Admitting: Family Medicine

## 2017-08-07 NOTE — Telephone Encounter (Signed)
Can you please look into this. Thanks 

## 2017-08-08 NOTE — Progress Notes (Signed)
   THERAPIST PROGRESS NOTE Session Time: 2:10-3:00  Participation Level:Active  Behavioral Response:CasualAlertMildlyAnxious  Type of Therapy: Individual Therapy  Treatment Goals addressed: Anxiety  Interventions:Strength-based and Supportive  Summary: Sherry Strong [id number] a 56y.o.femalewho presents with anxiety; insomnia.   Suicidal/Homicidal:Nowithout intent/plan  Therapist Response: Pt. Continues to present with anxiety. Pt. Continues to report frustration because she is not experiencing relief from her symptoms. Pt.'s self care is good. Pt. Presents as well groomed, got a hair cut since last session. Pt. Reports that she followed up on referral to [facility] and has lost 5 pounds by following the program. Pt. Reports that she has been setting healthier boundaries with her boyfriend. Significant part of the session focused on fact that Pt.'s mood disorder is motivated by situational factors I.e., that her boyfriend also suffers from depression and is isolating, not supportive, and not engaged in her life. 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 Has very limited emotional or physical support from other family members. Also Pt. Is very unhappy in her physical home environment and feels very "stuck" due to limited physical and financial support. Significant time in session was spent discussing reality that medication will not change any of these factors and that Pt. Will need to begin making a plan to resolve these problems. Counselor encouraged Pt. To begin by continuing to set very firm boundaries with her boyfriend about the type of social life that she wants. For example that he does not have to go to dinners, gym, movies, trips with her, but that this is the type of lifestyle that she wants that she will move forward in doing these things by herself and with friends. Pt. Was encouraged to develop thought  "creating a happy and healthy life for myself is non-negotiable". Pt. Stated that he will think I am having an affair. Counselor stated that she cannot control his thoughts, but if he should verbalized this thought, she can engage this conversation when and if it comes up and continue to state her personal truth and needs in an assertive manner.  Plan: Return again in 2-3weeks. Continue with CBT based therapy.  Diagnosis:Generalized Anxiety Disorder    [name], [facility] 08/08/2017

## 2017-08-13 ENCOUNTER — Ambulatory Visit (INDEPENDENT_AMBULATORY_CARE_PROVIDER_SITE_OTHER): Payer: Medicare Other | Admitting: Dietician

## 2017-08-13 VITALS — Ht 64.0 in | Wt 200.0 lb

## 2017-08-13 DIAGNOSIS — E8881 Metabolic syndrome: Secondary | ICD-10-CM

## 2017-08-13 DIAGNOSIS — Z9189 Other specified personal risk factors, not elsewhere classified: Secondary | ICD-10-CM

## 2017-08-13 DIAGNOSIS — Z6834 Body mass index (BMI) 34.0-34.9, adult: Secondary | ICD-10-CM | POA: Diagnosis not present

## 2017-08-13 DIAGNOSIS — E669 Obesity, unspecified: Secondary | ICD-10-CM | POA: Diagnosis not present

## 2017-08-14 ENCOUNTER — Other Ambulatory Visit (INDEPENDENT_AMBULATORY_CARE_PROVIDER_SITE_OTHER): Payer: Self-pay | Admitting: Family Medicine

## 2017-08-14 DIAGNOSIS — E559 Vitamin D deficiency, unspecified: Secondary | ICD-10-CM

## 2017-08-15 DIAGNOSIS — E8881 Metabolic syndrome: Secondary | ICD-10-CM | POA: Insufficient documentation

## 2017-08-15 NOTE — Progress Notes (Signed)
  Office: [phone]  /  Fax: [phone]     Sherry Strong has a diagnosis of insulin resistance based on her elevated fasting insulin level >5. Although Sherry Strong blood glucose readings are still under good control, insulin resistance puts her at greater risk of metabolic syndrome and diabetes. Sherry Strong is here for diabetic risk nutrition counseling.  Sherry Strong's weight today is 200 lbs. She has had a 1 lb weight loss since her last visit and a 6 lb weight loss since starting treatment with Sherry Strong.  She reports she has struggled somewhat over the past 2 weeks following her category 2 meal plan approximately 40% of the time. She states her hunger seems better controlled with less cravings over the past couple of weeks. She states she is getting bored with her dinner meal. She agreed to journaling her dinner meal using the application [employer]. Journaling food intake was discussed in detail with goals of 400-500 calories and 35+ grams of protein at dinner. Healthy eating during the Thanksgiving holiday discussed in detail.    Patient was educated about food nutrients ie protein, fats, simple and complex carbohydrates and how these affect insulin response. Focus on portion control,  avoiding simple carbohydrates and lower fat foods for ongoing wt loss efforts and glucose management  Sherry Strong is on the following meal plan: Category 2 with journaling at dinner 400-500 calories and 35 g protein. @Her /His@ meal plan was individualized for maximum benefit.  Also discussed at length the following behavioral modifications to help maximize  success increasing lean protein intake, decreasing simple carbohydrates, avoiding skipping meals, holiday eating strategies, keeping a strict food journal for dinner daily.    Sherry Strong has been instructed to work up to a goal of 150 minutes of combined cardio and strengthening exercise per week for weight loss and overall health benefits. Written information was provided and the following handouts  were given: journaling instructions, protein content of foods and healthy holiday eating.

## 2017-08-22 ENCOUNTER — Ambulatory Visit (INDEPENDENT_AMBULATORY_CARE_PROVIDER_SITE_OTHER): Payer: Medicare Other | Admitting: Family Medicine

## 2017-08-22 VITALS — BP 109/73 | HR 69 | Temp 98.6°F | Ht 64.0 in | Wt 201.0 lb

## 2017-08-22 DIAGNOSIS — Z6834 Body mass index (BMI) 34.0-34.9, adult: Secondary | ICD-10-CM

## 2017-08-22 DIAGNOSIS — E669 Obesity, unspecified: Secondary | ICD-10-CM

## 2017-08-22 DIAGNOSIS — E559 Vitamin D deficiency, unspecified: Secondary | ICD-10-CM

## 2017-08-22 MED ORDER — VITAMIN D (ERGOCALCIFEROL) 1.25 MG (50000 UNIT) PO CAPS: 50000.0000 [IU] | ORAL_CAPSULE | ORAL | 0 refills | Status: DC

## 2017-08-22 NOTE — Progress Notes (Signed)
Office: [phone]  /  Fax: [phone]   HPI:   Chief Complaint: OBESITY Sherry Strong is here to discuss her progress with her obesity treatment plan. She is on the Category 2 plan and is following her eating plan approximately 50 % of the time. She states she is exercising 0 minutes 0 times per week. Sherry Strong has been off track with holidays and increased stress. She is not eating all her plan and sometimes skipping meals. She is ready to get back on track.  Her weight is 201 lb (91.2 kg) today and has gained 1 pound since her last visit. She has lost 5 lbs since starting treatment with [name].  Vitamin D deficiency Sherry Strong has a diagnosis of vitamin D deficiency. She is on prescription Vit D, but not yet at goal. She denies nausea, vomiting or muscle weakness.  ALLERGIES: Allergies  Allergen Reactions  . Vistaril  [Hydroxyzine Hcl] Swelling    MEDICATIONS: Current Outpatient Medications on File Prior to Visit  Medication Sig Dispense Refill  . ALPRAZolam (XANAX) 0.5 MG tablet Take 0.5 mg by mouth 2 (two) times daily as needed. For anxiety    . aspirin EC 81 MG tablet Take 81 mg by mouth.    . Cyanocobalamin (VITAMIN B 12 PO) Take by mouth.    . Fenofibrate 150 MG CAPS Take 1 capsule by mouth daily.    Marland Kitchen lamoTRIgine (LAMICTAL) 200 MG tablet Take 1 tablet (200 mg total) daily by mouth. 90 tablet 0  . lurasidone (LATUDA) 80 MG TABS tablet Take 1 tablet (80 mg total) daily with breakfast by mouth. 30 tablet 1  . metoprolol succinate (TOPROL-XL) 25 MG 24 hr tablet Take 25 mg by mouth daily.     . pantoprazole (PROTONIX) 40 MG tablet Take 40 mg by mouth daily.    . pravastatin (PRAVACHOL) 40 MG tablet Take 40 mg by mouth every evening.    . vitamin C (ASCORBIC ACID) 500 MG tablet Take 500 mg by mouth daily.    . Vitamin D, Ergocalciferol, (DRISDOL) 50000 units CAPS capsule Take 1 capsule (50,000 Units total) every 7 (seven) days by mouth. 4 capsule 0   No current facility-administered medications  on file prior to visit.     PAST MEDICAL HISTORY: Past Medical History:  Diagnosis Date  . Anxiety   . Bipolar disorder ([place])   . Depression   . GERD (gastroesophageal reflux disease)   . HLD (hyperlipidemia)   . HTN (hypertension)   . Infertility, female   . Menopause     PAST SURGICAL HISTORY: Past Surgical History:  Procedure Laterality Date  . CESAREAN SECTION      SOCIAL HISTORY: Social History   Tobacco Use  . Smoking status: Never Smoker  . Smokeless tobacco: Never Used  Substance Use Topics  . Alcohol use: Yes    Alcohol/week: 3.0 - 3.6 oz    Types: 5 - 6 Cans of beer per week    Comment: Friday nights  . Drug use: No    FAMILY HISTORY: Family History  Problem Relation Age of Onset  . Heart disease Mother   . Depression Mother   . Hyperlipidemia Father   . Hypertension Father   . Heart disease Father   . Cancer Father   . Depression Father   . Anxiety disorder Father   . Sleep apnea Father     ROS: Review of Systems  Constitutional: Negative for weight loss.  Gastrointestinal: Negative for nausea and vomiting.  Musculoskeletal:       Negative muscle weakness    PHYSICAL EXAM: Blood pressure 109/73, pulse 69, temperature 98.6 F (37 C), temperature source Oral, height 5\' 4"  (1.626 m), weight 201 lb (91.2 kg), SpO2 99 %. Body mass index is 34.5 kg/m. Physical Exam  Constitutional: She is oriented to person, place, and time. She appears well-developed and well-nourished.  Cardiovascular: Normal rate.  Pulmonary/Chest: Effort normal.  Musculoskeletal: Normal range of motion.  Neurological: She is oriented to person, place, and time.  Skin: Skin is warm and dry.  Psychiatric: She has a normal mood and affect. Her behavior is normal.  Vitals reviewed.   RECENT LABS AND TESTS: BMET    Component Value Date/Time   NA 142 07/12/2017 1055   K 4.3 07/12/2017 1055   CL 102 07/12/2017 1055   CO2 22 07/12/2017 1055   GLUCOSE 91 07/12/2017  1055   BUN 9 07/12/2017 1055   CREATININE 0.85 07/12/2017 1055   CALCIUM 9.2 07/12/2017 1055   GFRNONAA 77 07/12/2017 1055   GFRAA 89 07/12/2017 1055   Lab Results  Component Value Date   HGBA1C 5.4 07/12/2017   Lab Results  Component Value Date   INSULIN 20.6 07/12/2017   CBC    Component Value Date/Time   WBC 6.5 07/12/2017 1055   RBC 4.05 07/12/2017 1055   HGB 13.1 07/12/2017 1055   HCT 40.1 07/12/2017 1055   MCV 99 (H) 07/12/2017 1055   MCH 32.3 07/12/2017 1055   MCHC 32.7 07/12/2017 1055   RDW 13.1 07/12/2017 1055   LYMPHSABS 1.5 07/12/2017 1055   EOSABS 0.1 07/12/2017 1055   BASOSABS 0.0 07/12/2017 1055   Iron/TIBC/Ferritin/ %Sat No results found for: IRON, TIBC, FERRITIN, IRONPCTSAT Lipid Panel     Component Value Date/Time   CHOL 225 (H) 07/12/2017 1055   TRIG 105 07/12/2017 1055   HDL 53 07/12/2017 1055   LDLCALC 151 (H) 07/12/2017 1055   Hepatic Function Panel     Component Value Date/Time   PROT 7.3 07/12/2017 1055   ALBUMIN 4.8 07/12/2017 1055   AST 37 07/12/2017 1055   ALT 50 (H) 07/12/2017 1055   ALKPHOS 82 07/12/2017 1055   BILITOT 0.2 07/12/2017 1055      Component Value Date/Time   TSH 2.420 07/12/2017 1055    ASSESSMENT AND PLAN: Vitamin D deficiency - Plan: Vitamin D, Ergocalciferol, (DRISDOL) 50000 units CAPS capsule  Class 1 obesity with serious comorbidity and body mass index (BMI) of 34.0 to 34.9 in adult, unspecified obesity type  PLAN:  Vitamin D Deficiency Sherry Strong was informed that low vitamin D levels contributes to fatigue and are associated with obesity, breast, and colon cancer. Sherry Strong agrees to continue taking prescription Vit D @50 ,000 IU every week #4 and we will refill for 1 month. She will follow up for routine testing of vitamin D, at least 2-3 times per year. She was informed of the risk of over-replacement of vitamin D and agrees to not increase her dose unless he discusses this with [name] first. Sherry Strong agrees to follow up  with our clinic in 2 weeks.  Obesity Sherry Strong is currently in the action stage of change. As such, her goal is to continue with weight loss efforts She has agreed to change to follow a lower carbohydrate, vegetable and lean protein rich diet plan Sherry Strong has been instructed to work up to a goal of 150 minutes of combined cardio and strengthening exercise per week for weight loss and  overall health benefits. We discussed the following Behavioral Modification Strategies today: increasing lean protein intake, work on meal planning and easy cooking plans, and no skipping meals   Benjamin has agreed to follow up with our clinic in 2 weeks. She was informed of the importance of frequent follow up visits to maximize her success with intensive lifestyle modifications for her multiple health conditions.  I, Trixie Dredge, am acting as transcriptionist for Dennard Nip, MD  I have reviewed the above documentation for accuracy and completeness, and I agree with the above. -Dennard Nip, MD     Today's visit was # 3 out of 22.  Starting weight: 206 lbs Starting date: 07/12/17 Today's weight : 201 lbs  Today's date: 08/22/2017 Total lbs lost to date: 5 (Patients must lose 7 lbs in the first 6 months to continue with counseling)   ASK: We discussed the diagnosis of obesity with Janiqua Friscia [id number] today and Cecillia agreed to give [name] permission to discuss obesity behavioral modification therapy today.  ASSESS: Renae has the diagnosis of obesity and her BMI today is 34.48 Jauna is in the action stage of change   ADVISE: Zea was educated on the multiple health risks of obesity as well as the benefit of weight loss to improve her health. She was advised of the need for long term treatment and the importance of lifestyle modifications.  AGREE: Multiple dietary modification options and treatment options were discussed and  [place] agreed to follow a lower carbohydrate, vegetable and lean protein rich diet plan We  discussed the following Behavioral Modification Strategies today: increasing lean protein intake, work on meal planning and easy cooking plans, and no skipping meals

## 2017-08-27 ENCOUNTER — Ambulatory Visit (INDEPENDENT_AMBULATORY_CARE_PROVIDER_SITE_OTHER): Payer: Medicare Other | Admitting: Psychiatry

## 2017-08-27 ENCOUNTER — Ambulatory Visit (INDEPENDENT_AMBULATORY_CARE_PROVIDER_SITE_OTHER): Payer: Medicare Other | Admitting: Physician Assistant

## 2017-08-27 DIAGNOSIS — F3131 Bipolar disorder, current episode depressed, mild: Secondary | ICD-10-CM

## 2017-08-27 DIAGNOSIS — Z630 Problems in relationship with spouse or partner: Secondary | ICD-10-CM | POA: Diagnosis not present

## 2017-08-27 DIAGNOSIS — F411 Generalized anxiety disorder: Secondary | ICD-10-CM | POA: Diagnosis not present

## 2017-08-27 DIAGNOSIS — G4700 Insomnia, unspecified: Secondary | ICD-10-CM
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
## 2017-08-28 ENCOUNTER — Other Ambulatory Visit: Payer: Self-pay

## 2017-08-28 ENCOUNTER — Ambulatory Visit (HOSPITAL_COMMUNITY): Payer: Medicare Other | Attending: Cardiology

## 2017-08-28 DIAGNOSIS — E669 Obesity, unspecified: Secondary | ICD-10-CM | POA: Diagnosis not present

## 2017-08-28 DIAGNOSIS — E785 Hyperlipidemia, unspecified: Secondary | ICD-10-CM | POA: Diagnosis not present

## 2017-08-28 DIAGNOSIS — R0600 Dyspnea, unspecified: Secondary | ICD-10-CM | POA: Insufficient documentation

## 2017-08-28 DIAGNOSIS — I10 Essential (primary) hypertension: Secondary | ICD-10-CM | POA: Insufficient documentation

## 2017-08-28 DIAGNOSIS — R011 Cardiac murmur, unspecified: Secondary | ICD-10-CM | POA: Diagnosis not present

## 2017-09-03 ENCOUNTER — Ambulatory Visit (INDEPENDENT_AMBULATORY_CARE_PROVIDER_SITE_OTHER): Payer: Medicare Other | Admitting: Family Medicine

## 2017-09-06 ENCOUNTER — Ambulatory Visit (INDEPENDENT_AMBULATORY_CARE_PROVIDER_SITE_OTHER): Payer: Medicare Other | Admitting: Family Medicine

## 2017-09-06 VITALS — BP 110/74 | HR 71 | Temp 98.5°F | Ht 64.0 in | Wt 198.0 lb

## 2017-09-06 DIAGNOSIS — Z6834 Body mass index (BMI) 34.0-34.9, adult: Secondary | ICD-10-CM | POA: Diagnosis not present

## 2017-09-06 DIAGNOSIS — E669 Obesity, unspecified: Secondary | ICD-10-CM | POA: Diagnosis not present

## 2017-09-06 DIAGNOSIS — E559 Vitamin D deficiency, unspecified: Secondary | ICD-10-CM | POA: Diagnosis not present

## 2017-09-06 MED ORDER — VITAMIN D (ERGOCALCIFEROL) 1.25 MG (50000 UNIT) PO CAPS: 50000.0000 [IU] | ORAL_CAPSULE | ORAL | 0 refills | Status: DC

## 2017-09-10 ENCOUNTER — Other Ambulatory Visit (HOSPITAL_COMMUNITY): Payer: Self-pay | Admitting: Psychiatry

## 2017-09-10 DIAGNOSIS — F3131 Bipolar disorder, current episode depressed, mild: Secondary | ICD-10-CM

## 2017-09-10 NOTE — Progress Notes (Signed)
Office: [phone]  /  Fax: [phone]   HPI:   Chief Complaint: OBESITY Sherry Strong is here to discuss her progress with her obesity treatment plan. She is on the lower carbohydrate, vegetable and lean protein rich diet plan and is following her eating plan approximately 95 % of the time. She states she is exercising 0 minutes 0 times per week. Sherry Strong is doing well with weight loss but she is not getting much support at home. She is still struggling with obesity prejudice against herself and others and feels judged and ashamed.  Her weight is 198 lb (89.8 kg) today and has had a weight loss of 3 pounds over a period of 2 weeks since her last visit. She has lost 8 lbs since starting treatment with [name].  Vitamin D deficiency Sherry Strong has a diagnosis of vitamin D deficiency. She is on prescription Vit D, but not yet at goal. She still notes fatigue and denies nausea, vomiting or muscle weakness.  ALLERGIES: Allergies  Allergen Reactions  . Vistaril  [Hydroxyzine Hcl] Swelling    MEDICATIONS: Current Outpatient Medications on File Prior to Visit  Medication Sig Dispense Refill  . ALPRAZolam (XANAX) 0.5 MG tablet Take 0.5 mg by mouth 2 (two) times daily as needed. For anxiety    . aspirin EC 81 MG tablet Take 81 mg by mouth.    . Cyanocobalamin (VITAMIN B 12 PO) Take by mouth.    . Fenofibrate 150 MG CAPS Take 1 capsule by mouth daily.    Marland Kitchen lamoTRIgine (LAMICTAL) 200 MG tablet Take 1 tablet (200 mg total) daily by mouth. 90 tablet 0  . lurasidone (LATUDA) 80 MG TABS tablet Take 1 tablet (80 mg total) daily with breakfast by mouth. 30 tablet 1  . metoprolol succinate (TOPROL-XL) 25 MG 24 hr tablet Take 25 mg by mouth daily.     . pantoprazole (PROTONIX) 40 MG tablet Take 40 mg by mouth daily.    . pravastatin (PRAVACHOL) 40 MG tablet Take 40 mg by mouth every evening.    . vitamin C (ASCORBIC ACID) 500 MG tablet Take 500 mg by mouth daily.     No current facility-administered medications on  file prior to visit.     PAST MEDICAL HISTORY: Past Medical History:  Diagnosis Date  . Anxiety   . Bipolar disorder ([place])   . Depression   . GERD (gastroesophageal reflux disease)   . HLD (hyperlipidemia)   . HTN (hypertension)   . Infertility, female   . Menopause     PAST SURGICAL HISTORY: Past Surgical History:  Procedure Laterality Date  . CESAREAN SECTION      SOCIAL HISTORY: Social History   Tobacco Use  . Smoking status: Never Smoker  . Smokeless tobacco: Never Used  Substance Use Topics  . Alcohol use: Yes    Alcohol/week: 3.0 - 3.6 oz    Types: 5 - 6 Cans of beer per week    Comment: Friday nights  . Drug use: No    FAMILY HISTORY: Family History  Problem Relation Age of Onset  . Heart disease Mother   . Depression Mother   . Hyperlipidemia Father   . Hypertension Father   . Heart disease Father   . Cancer Father   . Depression Father   . Anxiety disorder Father   . Sleep apnea Father     ROS: Review of Systems  Constitutional: Positive for malaise/fatigue and weight loss.  Gastrointestinal: Negative for nausea and vomiting.  Musculoskeletal:       Negative muscle weakness    PHYSICAL EXAM: Blood pressure 110/74, pulse 71, temperature 98.5 F (36.9 C), temperature source Oral, height 5\' 4"  (1.626 m), weight 198 lb (89.8 kg), SpO2 99 %. Body mass index is 33.99 kg/m. Physical Exam  Constitutional: She is oriented to person, place, and time. She appears well-developed and well-nourished.  Cardiovascular: Normal rate.  Pulmonary/Chest: Effort normal.  Musculoskeletal: Normal range of motion.  Neurological: She is oriented to person, place, and time.  Skin: Skin is warm and dry.  Psychiatric: She has a normal mood and affect. Her behavior is normal.  Vitals reviewed.   RECENT LABS AND TESTS: BMET    Component Value Date/Time   NA 142 07/12/2017 1055   K 4.3 07/12/2017 1055   CL 102 07/12/2017 1055   CO2 22 07/12/2017 1055    GLUCOSE 91 07/12/2017 1055   BUN 9 07/12/2017 1055   CREATININE 0.85 07/12/2017 1055   CALCIUM 9.2 07/12/2017 1055   GFRNONAA 77 07/12/2017 1055   GFRAA 89 07/12/2017 1055   Lab Results  Component Value Date   HGBA1C 5.4 07/12/2017   Lab Results  Component Value Date   INSULIN 20.6 07/12/2017   CBC    Component Value Date/Time   WBC 6.5 07/12/2017 1055   RBC 4.05 07/12/2017 1055   HGB 13.1 07/12/2017 1055   HCT 40.1 07/12/2017 1055   MCV 99 (H) 07/12/2017 1055   MCH 32.3 07/12/2017 1055   MCHC 32.7 07/12/2017 1055   RDW 13.1 07/12/2017 1055   LYMPHSABS 1.5 07/12/2017 1055   EOSABS 0.1 07/12/2017 1055   BASOSABS 0.0 07/12/2017 1055   Iron/TIBC/Ferritin/ %Sat No results found for: IRON, TIBC, FERRITIN, IRONPCTSAT Lipid Panel     Component Value Date/Time   CHOL 225 (H) 07/12/2017 1055   TRIG 105 07/12/2017 1055   HDL 53 07/12/2017 1055   LDLCALC 151 (H) 07/12/2017 1055   Hepatic Function Panel     Component Value Date/Time   PROT 7.3 07/12/2017 1055   ALBUMIN 4.8 07/12/2017 1055   AST 37 07/12/2017 1055   ALT 50 (H) 07/12/2017 1055   ALKPHOS 82 07/12/2017 1055   BILITOT 0.2 07/12/2017 1055      Component Value Date/Time   TSH 2.420 07/12/2017 1055    ASSESSMENT AND PLAN: Vitamin D deficiency - Plan: Vitamin D, Ergocalciferol, (DRISDOL) 50000 units CAPS capsule  Class 1 obesity without serious comorbidity with body mass index (BMI) of 34.0 to 34.9 in adult, unspecified obesity type  PLAN:  Vitamin D Deficiency Sherry Strong was informed that low vitamin D levels contributes to fatigue and are associated with obesity, breast, and colon cancer. Sherry Strong agrees to continue taking prescription Vit D @50 ,000 IU every week #4 and we will refill for 1 month. She will follow up for routine testing of vitamin D, at least 2-3 times per year. She was informed of the risk of over-replacement of vitamin D and agrees to not increase her dose unless he discusses this with [name] first.  Sherry Strong agrees to follow up with our clinic in 3 weeks with our dietitian and follow up in 5 weeks with myself.  Obesity Sherry Strong is currently in the action stage of change. As such, her goal is to continue with weight loss efforts She has agreed to follow a lower carbohydrate, vegetable and lean protein rich diet plan Sherry Strong has been instructed to work up to a goal of 150 minutes of combined cardio  and strengthening exercise per week for weight loss and overall health benefits. Sherry Strong has an unhealthy relationship with food, which we discussed as well as help her realize obesity prejudice only hurts her health and weight loss efforts. We discussed the following Behavioral Modification Stratagies today: dealing with family or coworker sabotage, holiday eating strategies  and emotional eating strategies   Sherry Strong has agreed to follow up with our clinic in 3 weeks with our dietitian and follow up in 5 weeks with myself. She was informed of the importance of frequent follow up visits to maximize her success with intensive lifestyle modifications for her multiple health conditions.  I, Trixie Dredge, am acting as transcriptionist for Dennard Nip, MD  I have reviewed the above documentation for accuracy and completeness, and I agree with the above. -Dennard Nip, MD     Today's visit was # 4 out of 22.  Starting weight: 206 lbs Starting date: 07/12/17 Today's weight : 198 lbs  Today's date: 09/06/2017 Total lbs lost to date: 8 (Patients must lose 7 lbs in the first 6 months to continue with counseling)   ASK: We discussed the diagnosis of obesity with Sherry Strong today and Sherry Strong agreed to give [name] permission to discuss obesity behavioral modification therapy today.  ASSESS: Sherry Strong has the diagnosis of obesity and her BMI today is 33.97 Sherry Strong is in the action stage of change   ADVISE: Sherry Strong was educated on the multiple health risks of obesity as well as the benefit of weight loss to improve her  health. She was advised of the need for long term treatment and the importance of lifestyle modifications.  AGREE: Multiple dietary modification options and treatment options were discussed and  Sherry Strong agreed to follow a lower carbohydrate, vegetable and lean protein rich diet plan We discussed the following Behavioral Modification Strategies today: dealing with family or coworker sabotage, holiday eating strategies  and emotional eating strategies

## 2017-09-13 ENCOUNTER — Ambulatory Visit (INDEPENDENT_AMBULATORY_CARE_PROVIDER_SITE_OTHER): Payer: Medicare Other | Admitting: Psychiatry

## 2017-09-13 ENCOUNTER — Encounter (HOSPITAL_COMMUNITY): Payer: Self-pay | Admitting: Psychiatry

## 2017-09-13 DIAGNOSIS — Z818 Family history of other mental and behavioral disorders: Secondary | ICD-10-CM | POA: Diagnosis not present

## 2017-09-13 DIAGNOSIS — Z79899 Other long term (current) drug therapy: Secondary | ICD-10-CM | POA: Diagnosis not present

## 2017-09-13 DIAGNOSIS — F3131 Bipolar disorder, current episode depressed, mild: Secondary | ICD-10-CM | POA: Diagnosis not present

## 2017-09-13 DIAGNOSIS — F419 Anxiety disorder, unspecified: Secondary | ICD-10-CM | POA: Diagnosis not present

## 2017-09-13 MED ORDER — LURASIDONE HCL 80 MG PO TABS: 80.0000 mg | ORAL_TABLET | Freq: Every day | ORAL | 0 refills | Status: DC

## 2017-09-13 MED ORDER — LAMOTRIGINE 200 MG PO TABS: 200.0000 mg | ORAL_TABLET | Freq: Every day | ORAL | 0 refills | Status: DC
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
## 2017-09-26 ENCOUNTER — Ambulatory Visit (INDEPENDENT_AMBULATORY_CARE_PROVIDER_SITE_OTHER): Payer: Medicare Other | Admitting: Dietician

## 2017-09-26 VITALS — Ht 64.0 in | Wt 198.0 lb

## 2017-09-26 DIAGNOSIS — E669 Obesity, unspecified: Secondary | ICD-10-CM | POA: Diagnosis not present

## 2017-09-26 DIAGNOSIS — Z6833 Body mass index (BMI) 33.0-33.9, adult: Secondary | ICD-10-CM

## 2017-09-26 DIAGNOSIS — E8881 Metabolic syndrome: Secondary | ICD-10-CM

## 2017-09-26 DIAGNOSIS — Z9189 Other specified personal risk factors, not elsewhere classified: Secondary | ICD-10-CM | POA: Diagnosis not present

## 2017-09-26 NOTE — Progress Notes (Signed)
  Office: [phone]  /  Fax: [phone]     Sherry Strong has a diagnosis of insulin resistance based on her elevated fasting insulin level >5. Although Sherry Strong's blood glucose readings are still under good control, insulin resistance puts her at greater risk of metabolic syndrome and diabetes. Sherry Strong is here for diabetes risk nutrition education today which includes her obesity treatment plan.   Sherry Strong's weight today is 198 lbs, she has maintained well over the Thanksgiving and Christmas holidays. She has had a weight loss of 8 lbs since beginning treatment with [name]. She is on our low carbohydrate meal plan and states she has been following it approximately 40% of the time. She reports her hunger is manageable and cravings are significantly more controlled.  Variety of lean proteins and vegetables discussed.    She states her family has been more supportive  throughout the holidays. Sherry Strong is on the following meal plan: low carbohydrate Her meal plan was individualized for maximum benefit.  Also discussed at length the following behavioral modifications to help maximize success: increasing lean protein intake.    Sherry Strong has been instructed to work up to a goal of 150 minutes of combined cardio and strengthening exercise per week for weight loss and overall health benefits.    Office: [phone]  /  Fax: [phone]  OBESITY BEHAVIORAL INTERVENTION VISIT  Today's visit was # 5 out of 22.  Starting weight: 206 lbs Starting date: 07/12/17 Today's weight : Weight: 198 lb (89.8 kg)  Today's date: 09/26/2017 Total lbs lost to date: 8 (Patients must lose 7 lbs in the first 6 months to continue with counseling)   ASK: We discussed the diagnosis of obesity with Sherry Strong JIRCVEL today and Sherry Strong agreed to give [name] permission to discuss obesity behavioral modification therapy today.  ASSESS: Sherry Strong has the diagnosis of obesity and her BMI today is 33.97 Sherry Strong is in the action stage of change   ADVISE: Sherry Strong was  educated on the multiple health risks of obesity as well as the benefit of weight loss to improve her health. She was advised of the need for long term treatment and the importance of lifestyle modifications.  AGREE: Multiple dietary modification options and treatment options were discussed and  Sherry Strong agreed to follow a lower carbohydrate, vegetable and lean protein rich diet plan We discussed the following Behavioral Modification Stratagies today: increasing lean protein intake

## 2017-09-27 ENCOUNTER — Ambulatory Visit (INDEPENDENT_AMBULATORY_CARE_PROVIDER_SITE_OTHER): Payer: Medicare Other | Admitting: Psychiatry

## 2017-09-27 ENCOUNTER — Telehealth (HOSPITAL_COMMUNITY): Payer: Self-pay

## 2017-09-27 DIAGNOSIS — F3131 Bipolar disorder, current episode depressed, mild: Secondary | ICD-10-CM | POA: Diagnosis not present

## 2017-09-27 NOTE — Telephone Encounter (Signed)
Patient came to me today and would like to change providers. Patient states that she is just not feeling a connection with you and would like to try someone new. Please review and advise, thank you

## 2017-09-28 NOTE — Progress Notes (Signed)
   THERAPIST PROGRESS NOTE   Session Time:3:10-4:10  Participation Level:Active  Behavioral Response:CasualAlertActiveEuthymic  Type of Therapy: Individual Therapy  Treatment Goals addressed: Anxiety  Interventions:Strength-based and Supportive  Summary: Sherry Strong [id number] a 56y.o.femalewho presents with anxiety; insomnia.   Suicidal/Homicidal:Nowithout intent/plan  Therapist Response: Pt. continues to present as alert, talkative, energetic, engaged in the therapeutic process. Pt. Continues to make progress with becoming more physically active, improvements in her self-care, engaging more with her family and friends, and organizing her home. Pt. Is also developing insight into her relationship with her boyfriend who is severely depressed and becoming more resistant to involving with her socially or emotionally. Pt. Is accepting that she does not want to separate from him, but also that to live a happy and fulfilled life she will need to move forward with her personal goals which are to maintain an active and busy life even if it means that she does so by herself and with her friends. Pt. Made goal of going to the movies and out to dinner alone. Pt. Has never done this, discussed the barriers to this goal, but discussed that going to a casual dining restaurant was doable for her before our next session.    Plan: Return again in 2-3weeks. Continue with CBT based therapy.  Diagnosis:Generalized Anxiety Disorder  [name], [facility] 09/28/2017

## 2017-10-01 ENCOUNTER — Other Ambulatory Visit: Payer: Self-pay | Admitting: Nurse Practitioner

## 2017-10-01 ENCOUNTER — Ambulatory Visit
Admission: RE | Admit: 2017-10-01 | Discharge: 2017-10-01 | Disposition: A | Discharge status: Home / Self Care | Payer: Medicare Other | Source: Ambulatory Visit | Attending: Nurse Practitioner | Admitting: Nurse Practitioner

## 2017-10-01 DIAGNOSIS — R1011 Right upper quadrant pain: Secondary | ICD-10-CM

## 2017-10-03 ENCOUNTER — Ambulatory Visit: Payer: Self-pay | Admitting: Surgery

## 2017-10-03 NOTE — Patient Instructions (Signed)
Sherry Strong TDVVOHY  10/03/2017   Your procedure is scheduled on: 10/05/2017    Report to [facility] Main  Entrance  Report to admitting at   0700 AM   Call this number if you have problems the morning of surgery [phone]   Remember: Do not eat food or drink liquids :After Midnight.     Take these medicines the morning of surgery with A SIP OF WATER: Lamictal, Latuda, Metoprolol ( Toprol), Protonix                                 You may not have any metal on your body including hair pins and              piercings  Do not wear jewelry, make-up, lotions, powders or perfumes, deodorant             Do not wear nail polish.  Do not shave  48 hours prior to surgery.                Do not bring valuables to the hospital. [place].  Contacts, dentures or bridgework may not be worn into surgery. .     Patients discharged the day of surgery will not be allowed to drive home.  Name and phone number of your driver:                Please read over the following fact sheets you were given: _____________________________________________________________________             [facility] - Preparing for Surgery Before surgery, you can play an important role.  Because skin is not sterile, your skin needs to be as free of germs as possible.  You can reduce the number of germs on your skin by washing with CHG (chlorahexidine gluconate) soap before surgery.  CHG is an antiseptic cleaner which kills germs and bonds with the skin to continue killing germs even after washing. Please DO NOT use if you have an allergy to CHG or antibacterial soaps.  If your skin becomes reddened/irritated stop using the CHG and inform your nurse when you arrive at Short Stay. Do not shave (including legs and underarms) for at least 48 hours prior to the first CHG shower.  You may shave your face/neck. Please follow these instructions carefully:  1.   Shower with CHG Soap the night before surgery and the  morning of Surgery.  2.  If you choose to wash your hair, wash your hair first as usual with your  normal  shampoo.  3.  After you shampoo, rinse your hair and body thoroughly to remove the  shampoo.                           4.  Use CHG as you would any other liquid soap.  You can apply chg directly  to the skin and wash                       Gently with a scrungie or clean washcloth.  5.  Apply the CHG Soap to your body ONLY FROM THE NECK DOWN.   Do not use  on face/ open                           Wound or open sores. Avoid contact with eyes, ears mouth and genitals (private parts).                       Wash face,  Genitals (private parts) with your normal soap.             6.  Wash thoroughly, paying special attention to the area where your surgery  will be performed.  7.  Thoroughly rinse your body with warm water from the neck down.  8.  DO NOT shower/wash with your normal soap after using and rinsing off  the CHG Soap.                9.  Pat yourself dry with a clean towel.            10.  Wear clean pajamas.            11.  Place clean sheets on your bed the night of your first shower and do not  sleep with pets. Day of Surgery : Do not apply any lotions/deodorants the morning of surgery.  Please wear clean clothes to the hospital/surgery center.  FAILURE TO FOLLOW THESE INSTRUCTIONS MAY RESULT IN THE CANCELLATION OF YOUR SURGERY PATIENT SIGNATURE_________________________________  NURSE SIGNATURE__________________________________  ________________________________________________________________________ NO SOLID FOOD AFTER MIDNIGHT THE NIGHT PRIOR TO SRRGERY. NOTHING BY MOUTH EXCEPT CLEAR LIQUIDS UNTIL 3 HOURS PRIOR TO [place] SURGERY. PLEASE FINISH ENSURE DRINK PER SURGEON ORDER 3 HOURS PRIOR TO SCHEDULED SURGERY TIME WHICH NEEDS TO BE COMPLETED AT __0600am__________.

## 2017-10-04 ENCOUNTER — Other Ambulatory Visit: Payer: Self-pay

## 2017-10-04 ENCOUNTER — Encounter (HOSPITAL_COMMUNITY): Payer: Self-pay

## 2017-10-04 ENCOUNTER — Encounter (HOSPITAL_COMMUNITY): Payer: Self-pay | Admitting: Certified Registered Nurse Anesthetist

## 2017-10-04 ENCOUNTER — Encounter (HOSPITAL_COMMUNITY)
Admission: RE | Admit: 2017-10-04 | Discharge: 2017-10-04 | Disposition: A | Discharge status: Home / Self Care | Payer: Medicare Other | Source: Ambulatory Visit | Attending: Surgery | Admitting: Surgery

## 2017-10-04 DIAGNOSIS — E669 Obesity, unspecified: Secondary | ICD-10-CM | POA: Diagnosis not present

## 2017-10-04 DIAGNOSIS — F319 Bipolar disorder, unspecified: Secondary | ICD-10-CM | POA: Diagnosis not present

## 2017-10-04 DIAGNOSIS — K8000 Calculus of gallbladder with acute cholecystitis without obstruction: Secondary | ICD-10-CM | POA: Diagnosis not present

## 2017-10-04 DIAGNOSIS — Z8261 Family history of arthritis: Secondary | ICD-10-CM | POA: Diagnosis not present

## 2017-10-04 DIAGNOSIS — K829 Disease of gallbladder, unspecified: Secondary | ICD-10-CM | POA: Diagnosis present

## 2017-10-04 DIAGNOSIS — E559 Vitamin D deficiency, unspecified: Secondary | ICD-10-CM | POA: Diagnosis not present

## 2017-10-04 DIAGNOSIS — E7800 Pure hypercholesterolemia, unspecified: Secondary | ICD-10-CM | POA: Diagnosis not present

## 2017-10-04 DIAGNOSIS — I10 Essential (primary) hypertension: Secondary | ICD-10-CM | POA: Diagnosis not present

## 2017-10-04 DIAGNOSIS — Z8049 Family history of malignant neoplasm of other genital organs: Secondary | ICD-10-CM | POA: Diagnosis not present

## 2017-10-04 DIAGNOSIS — Z8249 Family history of ischemic heart disease and other diseases of the circulatory system: Secondary | ICD-10-CM | POA: Diagnosis not present

## 2017-10-04 DIAGNOSIS — Z6835 Body mass index (BMI) 35.0-35.9, adult: Secondary | ICD-10-CM | POA: Diagnosis not present

## 2017-10-04 DIAGNOSIS — Z7982 Long term (current) use of aspirin: Secondary | ICD-10-CM | POA: Diagnosis not present

## 2017-10-04 DIAGNOSIS — F419 Anxiety disorder, unspecified: Secondary | ICD-10-CM | POA: Diagnosis not present

## 2017-10-04 DIAGNOSIS — Z01812 Encounter for preprocedural laboratory examination: Secondary | ICD-10-CM | POA: Diagnosis not present

## 2017-10-04 DIAGNOSIS — Z79899 Other long term (current) drug therapy: Secondary | ICD-10-CM | POA: Diagnosis not present

## 2017-10-04 DIAGNOSIS — K219 Gastro-esophageal reflux disease without esophagitis: Secondary | ICD-10-CM | POA: Diagnosis not present

## 2017-10-04 HISTORY — DX: Personal history of other diseases of the digestive system: Z87.19

## 2017-10-04 HISTORY — DX: Other specified postprocedural states: Z98.890

## 2017-10-04 HISTORY — DX: Cardiac murmur, unspecified: R01.1

## 2017-10-04 HISTORY — DX: Nausea with vomiting, unspecified: R11.2

## 2017-10-04 HISTORY — DX: Adverse effect of unspecified anesthetic, initial encounter: T41.45XA

## 2017-10-04 LAB — CBC WITH DIFFERENTIAL/PLATELET
Basophils Absolute: 0 10*3/uL (ref 0.0–0.1)
Basophils Relative: 0 %
Eosinophils Absolute: 0.1 10*3/uL (ref 0.0–0.7)
Eosinophils Relative: 2 %
HCT: 38.3 % (ref 36.0–46.0)
Hemoglobin: 12.7 g/dL (ref 12.0–15.0)
Lymphocytes Relative: 16 %
Lymphs Abs: 1.2 10*3/uL (ref 0.7–4.0)
MCH: 32.6 pg (ref 26.0–34.0)
MCHC: 33.2 g/dL (ref 30.0–36.0)
MCV: 98.2 fL (ref 78.0–100.0)
Monocytes Absolute: 0.6 10*3/uL (ref 0.1–1.0)
Monocytes Relative: 9 %
Neutro Abs: 5.2 10*3/uL (ref 1.7–7.7)
Neutrophils Relative %: 73 %
Platelets: 237 10*3/uL (ref 150–400)
RBC: 3.9 MIL/uL (ref 3.87–5.11)
RDW: 13.1 % (ref 11.5–15.5)
WBC: 7.2 10*3/uL (ref 4.0–10.5)

## 2017-10-04 LAB — COMPREHENSIVE METABOLIC PANEL
ALT: 77 U/L — ABNORMAL HIGH (ref 14–54)
AST: 63 U/L — ABNORMAL HIGH (ref 15–41)
Albumin: 4 g/dL (ref 3.5–5.0)
Alkaline Phosphatase: 111 U/L (ref 38–126)
Anion gap: 8 (ref 5–15)
BUN: 9 mg/dL (ref 6–20)
CO2: 25 mmol/L (ref 22–32)
Calcium: 9.3 mg/dL (ref 8.9–10.3)
Chloride: 106 mmol/L (ref 101–111)
Creatinine, Ser: 0.74 mg/dL (ref 0.44–1.00)
GFR calc Af Amer: >60 mL/min (ref >60)
GFR calc non Af Amer: >60 mL/min (ref >60)
Glucose, Bld: 121 mg/dL — ABNORMAL HIGH (ref 65–99)
Potassium: 4.1 mmol/L (ref 3.5–5.1)
Sodium: 139 mmol/L (ref 135–145)
Total Bilirubin: 0.8 mg/dL (ref 0.3–1.2)
Total Protein: 7.7 g/dL (ref 6.5–8.1)

## 2017-10-04 NOTE — Progress Notes (Signed)
Patient with previous husband history of aspiration after surgery who ended up n ICD and lengthened hospital stay due to aspiration after surgery. Patient is very nervous about the preop Ensure drink.  Patient may or may not drink the preop Ensure drink am of surgery.

## 2017-10-04 NOTE — Progress Notes (Signed)
07/12/17-ekg-epic Echo-08/29/17-epic

## 2017-10-04 NOTE — Progress Notes (Signed)
Called office of [facility] surgery and made Sunday Spillers Triage aware of strep throat diagnosis on 10/01/2017 and patient on Amoxicillin with O2Sat and temp today at preop .  Also made aware of patient with husband history of aspiration after surgery and that patient may or may not drinnk Ensure preop drink.  Sunday Spillers will inform Dr Alphonsa Overall who is in office this pm.

## 2017-10-04 NOTE — Progress Notes (Signed)
Called [facility] and reported to Triage that patient positive for strep throat on 10/01/2017 and placed on antibiotics- Amoxicillin with a 10 day regimen.

## 2017-10-04 NOTE — H&P (Signed)
Sherry Strong VKPQAES  Location: [facility] Patient #: [id number] DOB: 07-Feb-1961 Single / Language: Sherry Strong / Race: White Female  History of Present Illness   The patient is a 57 year old female who presents with a complaint of gall bladder disease.  The PCP is Eldridge Abrahams, NP  The patient was referred by Eldridge Abrahams, NP.  She comes by herself. But she said that her boyfrind, Sherry Strong, is seeing Dr. Ninfa Linden for a recurrent hernia.  She developed some epigastric and right upper quadrant pain this past weekend. She went to an urgent care Center to be seen. She talked to Wandra Arthurs, her primary care provider, who obtained an ultrasound of her abdomen. She had an abdominal ultrasound on 01 October 2016 which showed a 1.8 cm stone within the gallbladder neck. There was no associated gallbladder wall thickening. Her liver had some increased echogenicity, possible hepatic steatosis. She's had fairly persistent right upper quadrant pain since this began. She's had no jaundice, no nausea or vomiting, or no fever. She also developed a sore throat and tested positive for strep throat. She was started on amoxicillin on Monday, 7 January.  She's had no history of stomach or liver disease. Instead of a colonoscopy, she underwent a Cologuard test which was negative. She's had a C-section for her son 21 years. Prior to that she had a laparoscopy for endometriosis.   I discussed with the patient the indications and risks of gall bladder surgery. The primary risks of gall bladder surgery include, but are not limited to, bleeding, infection, common bile duct injury, and open surgery. There is also the risk that the patient may have continued symptoms after surgery. We discussed the typical post-operative recovery course. I tried to answer the patient's questions. I gave the patient literature about gall bladder surgery.  Past Medical History: 1.  Bipolar - Sees Dr. [name] Olea - but she is changing physicians for her bipolar/depression (she is almost always on the depressed side) On Lamictal and Latuda 2. She has seen Dr. Inocencio Homes for weight loss 3. Low vitamin D 4. Strep throat - started treatment 10/01/2016 5. On disability since 2006 for depression 6. HTN 7. She had a recent echo, 08/28/2017 - because of murmur that some people could hear - was okay. EF - 60% 8. Hypercholesterolemia  Social History: Divorced. Lives with boyfriend, Sherry Strong. He is here seeing Dr. Ninfa Linden for recurrent hernia.   Past Surgical History (April Staton, [place]; 10/03/2017 10:27 AM) Cesarean Section - 1   Diagnostic Studies History (April Staton, [place]; 10/03/2017 10:27 AM) Colonoscopy  within last year Mammogram  1-3 years ago  Allergies (April Staton, CMA; 10/03/2017 10:29 AM) Vistaril *ANTIANXIETY AGENTS*   Medication History (April Staton, CMA; 10/03/2017 10:33 AM) ALPRAZolam (0.25MG  Tablet, Oral) Active. Aspirin (81MG  Tablet, Oral) Active. Vitamin B12 (Oral) Specific strength unknown - Active. Fenofibrate (150MG  Capsule, Oral) Active. LamoTRIgine (200MG  Tablet, Oral) Active. Lurasidone HCl (60MG  Tablet, Oral) Active. Metoprolol Succinate ER (25MG  Tablet ER 24HR, Oral) Active. Protonix (40MG  Packet, Oral) Active. Pravastatin Sodium (40MG  Tablet, Oral) Active. Vitamin C (500MG  Capsule, Oral) Active. Vitamin D (50000U Capsule, Oral) Active. Medications Reconciled  Social History (April Staton, CMA; 10/03/2017 10:27 AM) Alcohol use  Occasional alcohol use. Caffeine use  Carbonated beverages, Coffee. No drug use  Tobacco use  Never smoker.  Family History (April Staton, [place]; 10/03/2017 10:27 AM) Arthritis  Father. Cervical Cancer  Mother. Depression  Father, Mother. Heart Disease  Father.  Pregnancy /  Birth History (April Staton, [place]; 10/03/2017 10:27 AM) Age at menarche  79 years. Age of menopause   75-50 Gravida  1 Irregular periods  Length (months) of breastfeeding  3-6 Maternal age  36-35 Para  1  Other Problems (April Staton, CMA; 10/03/2017 10:27 AM) Anxiety Disorder  Cholelithiasis  Depression  Gastroesophageal Reflux Disease  High blood pressure  Hypercholesterolemia     Review of Systems (April Staton CMA; 10/03/2017 10:27 AM) General Present- Appetite Loss, Fatigue and Weight Gain. Not Present- Chills, Fever, Night Sweats and Weight Loss. HEENT Present- Sore Throat and Wears glasses/contact lenses. Not Present- Earache, Hearing Loss, Hoarseness, Nose Bleed, Oral Ulcers, Ringing in the Ears, Seasonal Allergies, Sinus Pain, Visual Disturbances and Yellow Eyes. Respiratory Present- Snoring. Not Present- Bloody sputum, Chronic Cough, Difficulty Breathing and Wheezing. Breast Not Present- Breast Mass, Breast Pain, Nipple Discharge and Skin Changes. Cardiovascular Not Present- Chest Pain, Difficulty Breathing Lying Down, Leg Cramps, Palpitations, Rapid Heart Rate, Shortness of Breath and Swelling of Extremities. Gastrointestinal Present- Abdominal Pain. Not Present- Bloating, Bloody Stool, Change in Bowel Habits, Chronic diarrhea, Constipation, Difficulty Swallowing, Excessive gas, Gets full quickly at meals, Hemorrhoids, Indigestion, Nausea, Rectal Pain and Vomiting. Musculoskeletal Not Present- Back Pain, Joint Pain, Joint Stiffness, Muscle Pain, Muscle Weakness and Swelling of Extremities. Neurological Not Present- Decreased Memory, Fainting, Headaches, Numbness, Seizures, Tingling, Tremor, Trouble walking and Weakness. Psychiatric Present- Anxiety, Bipolar, Change in Sleep Pattern, Depression and Fearful. Not Present- Frequent crying. Endocrine Not Present- Cold Intolerance, Excessive Hunger, Hair Changes, Heat Intolerance, Hot flashes and New Diabetes.  Vitals (April Staton CMA; 10/03/2017 10:33 AM) 10/03/2017 10:33 AM Weight: 198.13 lb Height: 63in Body Surface  Area: 1.93 m Body Mass Index: 35.1 kg/m  Pulse: 119 (Regular)  BP: 118/80 (Sitting, Left Arm, Standard)   Physical Exam  General: Obese WF alert and generally healthy appearing. Skin: Inspection and palpation of the skin unremarkable.  Eyes: Conjunctivae white, pupils equal. Has these fake eyelashes Face, ears, nose, mouth, and throat: Face - normal. Normal ears and nose. Lips and teeth normal.  Neck: Supple. No mass. Trachea midline. No thyroid mass. Lymph Nodes: No supraclavicular or cervical adenopathy. No axillary adenopathy.  Lungs: Normal respiratory effort. Clear to auscultation and symmetric breath sounds. Cardiovascular: Regular rate and rythm. Normal auscultation of the heart. No murmur or rub.  Abdomen: Soft. No mass. Liver and spleen not palpable. No tenderness. No hernia. Normal bowel sounds. Infraumbilical scar from laparoscopy Rectal: Not done.  Musculoskeletal/extremities: Normal gait. Good strength and ROM in upper and lower extremities.  Neurologic: Grossly intact to motor and sensory function. Psychiatric: Has normal mood and affect. Judgement and insight appear normal.    Assessment & Plan  1.  GALL BLADDER DISEASE (K82.9)  Plan:  1) Schedule for gall bladder surgery  2.  BIPOLAR DEPRESSION (F31.30)  Sees Dr. [name] Olea - but she is changing physicians for her bipolar/depression (she is almost always on the depressed side) On Lamictal and Latuda 3. She has seen Dr. Inocencio Homes for weight loss 4. Low vitamin D 5. Strep throat - started treatment 10/01/2016 6. On disability since 2006 for depression 7. HTN 8. She had a recent echo, 08/28/2017 - because of murmur that some people could hear - was okay. EF - 60% 9. Hypercholesterolemia   Alphonsa Overall, MD, [facility] Pager: [phone] Office phone:  [phone]

## 2017-10-05 ENCOUNTER — Ambulatory Visit (HOSPITAL_COMMUNITY): Payer: Medicare Other

## 2017-10-05 ENCOUNTER — Ambulatory Visit (HOSPITAL_COMMUNITY): Payer: Medicare Other | Admitting: Certified Registered Nurse Anesthetist

## 2017-10-05 ENCOUNTER — Other Ambulatory Visit: Payer: Self-pay

## 2017-10-05 ENCOUNTER — Encounter (HOSPITAL_COMMUNITY): Payer: Self-pay | Admitting: *Deleted

## 2017-10-05 ENCOUNTER — Encounter (HOSPITAL_COMMUNITY)
Admission: RE | Disposition: A | Discharge status: Home / Self Care | Payer: Self-pay | Source: Ambulatory Visit | Attending: Surgery

## 2017-10-05 ENCOUNTER — Observation Stay (HOSPITAL_COMMUNITY)
Admission: RE | Admit: 2017-10-05 | Discharge: 2017-10-06 | Disposition: A | Discharge status: Home / Self Care | Payer: Medicare Other | Source: Ambulatory Visit | Attending: Surgery | Admitting: Surgery

## 2017-10-05 DIAGNOSIS — Z7982 Long term (current) use of aspirin: Secondary | ICD-10-CM | POA: Insufficient documentation

## 2017-10-05 DIAGNOSIS — F319 Bipolar disorder, unspecified: Secondary | ICD-10-CM | POA: Insufficient documentation

## 2017-10-05 DIAGNOSIS — Z8261 Family history of arthritis: Secondary | ICD-10-CM | POA: Insufficient documentation

## 2017-10-05 DIAGNOSIS — Z01812 Encounter for preprocedural laboratory examination: Secondary | ICD-10-CM | POA: Insufficient documentation

## 2017-10-05 DIAGNOSIS — Z8249 Family history of ischemic heart disease and other diseases of the circulatory system: Secondary | ICD-10-CM | POA: Insufficient documentation

## 2017-10-05 DIAGNOSIS — E7800 Pure hypercholesterolemia, unspecified: Secondary | ICD-10-CM | POA: Insufficient documentation

## 2017-10-05 DIAGNOSIS — E559 Vitamin D deficiency, unspecified: Secondary | ICD-10-CM | POA: Insufficient documentation

## 2017-10-05 DIAGNOSIS — I10 Essential (primary) hypertension: Secondary | ICD-10-CM | POA: Diagnosis not present

## 2017-10-05 DIAGNOSIS — Z6835 Body mass index (BMI) 35.0-35.9, adult: Secondary | ICD-10-CM | POA: Insufficient documentation

## 2017-10-05 DIAGNOSIS — K219 Gastro-esophageal reflux disease without esophagitis: Secondary | ICD-10-CM | POA: Insufficient documentation

## 2017-10-05 DIAGNOSIS — K8000 Calculus of gallbladder with acute cholecystitis without obstruction: Principal | ICD-10-CM | POA: Insufficient documentation

## 2017-10-05 DIAGNOSIS — Z8049 Family history of malignant neoplasm of other genital organs: Secondary | ICD-10-CM | POA: Insufficient documentation

## 2017-10-05 DIAGNOSIS — E669 Obesity, unspecified: Secondary | ICD-10-CM | POA: Insufficient documentation

## 2017-10-05 DIAGNOSIS — Z79899 Other long term (current) drug therapy: Secondary | ICD-10-CM | POA: Insufficient documentation

## 2017-10-05 DIAGNOSIS — F419 Anxiety disorder, unspecified: Secondary | ICD-10-CM | POA: Insufficient documentation

## 2017-10-05 DIAGNOSIS — Z419 Encounter for procedure for purposes other than remedying health state, unspecified: Secondary | ICD-10-CM

## 2017-10-05 HISTORY — PX: CHOLECYSTECTOMY: SHX55

## 2017-10-05 SURGERY — LAPAROSCOPIC CHOLECYSTECTOMY WITH INTRAOPERATIVE CHOLANGIOGRAM
Anesthesia: General

## 2017-10-05 MED ORDER — ONDANSETRON HCL 4 MG/2ML IJ SOLN: 4.0000 mg | Freq: Four times a day (QID) | INTRAMUSCULAR | Status: DC | PRN

## 2017-10-05 MED ORDER — KETOROLAC TROMETHAMINE 30 MG/ML IJ SOLN: 30.0000 mg | Freq: Once | INTRAMUSCULAR | Status: DC

## 2017-10-05 MED ORDER — GABAPENTIN 300 MG PO CAPS
300.0000 mg | ORAL_CAPSULE | ORAL | Status: AC
  Administered 2017-10-05: 300 mg via ORAL
  Filled 2017-10-05: qty 1

## 2017-10-05 MED ORDER — MORPHINE SULFATE (PF) 2 MG/ML IV SOLN: 1.0000 mg | INTRAVENOUS | Status: DC | PRN

## 2017-10-05 MED ORDER — MIDAZOLAM HCL 2 MG/2ML IJ SOLN
INTRAMUSCULAR | Status: AC
  Filled 2017-10-05: qty 2

## 2017-10-05 MED ORDER — ALPRAZOLAM 0.5 MG PO TABS
0.5000 mg | ORAL_TABLET | Freq: Two times a day (BID) | ORAL | Status: DC | PRN
  Filled 2017-10-05: qty 1

## 2017-10-05 MED ORDER — TRAMADOL HCL 50 MG PO TABS
50.0000 mg | ORAL_TABLET | Freq: Four times a day (QID) | ORAL | Status: DC | PRN
  Administered 2017-10-05: 50 mg via ORAL
  Filled 2017-10-05: qty 1

## 2017-10-05 MED ORDER — HYDROMORPHONE HCL 1 MG/ML IJ SOLN
INTRAMUSCULAR | Status: AC
  Filled 2017-10-05: qty 1

## 2017-10-05 MED ORDER — HEPARIN SODIUM (PORCINE) 5000 UNIT/ML IJ SOLN
5000.0000 [IU] | Freq: Three times a day (TID) | INTRAMUSCULAR | Status: DC
  Administered 2017-10-05 – 2017-10-06 (×2): 5000 [IU] via SUBCUTANEOUS
  Filled 2017-10-05 (×2): qty 1

## 2017-10-05 MED ORDER — SUGAMMADEX SODIUM 200 MG/2ML IV SOLN
INTRAVENOUS | Status: DC | PRN
  Administered 2017-10-05: 200 mg via INTRAVENOUS

## 2017-10-05 MED ORDER — ROCURONIUM BROMIDE 10 MG/ML (PF) SYRINGE
PREFILLED_SYRINGE | INTRAVENOUS | Status: DC | PRN
  Administered 2017-10-05: 50 mg via INTRAVENOUS
  Administered 2017-10-05: 10 mg via INTRAVENOUS

## 2017-10-05 MED ORDER — ONDANSETRON 4 MG PO TBDP: 4.0000 mg | ORAL_TABLET | Freq: Four times a day (QID) | ORAL | Status: DC | PRN

## 2017-10-05 MED ORDER — KETOROLAC TROMETHAMINE 30 MG/ML IJ SOLN
INTRAMUSCULAR | Status: AC
  Administered 2017-10-05: 30 mg
  Filled 2017-10-05: qty 1

## 2017-10-05 MED ORDER — PROPOFOL 10 MG/ML IV BOLUS
INTRAVENOUS | Status: DC | PRN
  Administered 2017-10-05: 50 mg via INTRAVENOUS
  Administered 2017-10-05: 150 mg via INTRAVENOUS
  Administered 2017-10-05: 50 mg via INTRAVENOUS

## 2017-10-05 MED ORDER — ROCURONIUM BROMIDE 50 MG/5ML IV SOSY
PREFILLED_SYRINGE | INTRAVENOUS | Status: AC
  Filled 2017-10-05: qty 5

## 2017-10-05 MED ORDER — IOPAMIDOL (ISOVUE-300) INJECTION 61%
INTRAVENOUS | Status: DC | PRN
  Administered 2017-10-05: 8 mL

## 2017-10-05 MED ORDER — SCOPOLAMINE 1 MG/3DAYS TD PT72
MEDICATED_PATCH | TRANSDERMAL | Status: DC | PRN
  Administered 2017-10-05: 1 via TRANSDERMAL

## 2017-10-05 MED ORDER — BUPIVACAINE-EPINEPHRINE (PF) 0.5% -1:200000 IJ SOLN
INTRAMUSCULAR | Status: DC | PRN
  Administered 2017-10-05: 30 mL

## 2017-10-05 MED ORDER — BUPIVACAINE-EPINEPHRINE (PF) 0.5% -1:200000 IJ SOLN
INTRAMUSCULAR | Status: AC
  Filled 2017-10-05: qty 30

## 2017-10-05 MED ORDER — CEFAZOLIN SODIUM-DEXTROSE 2-4 GM/100ML-% IV SOLN
2.0000 g | INTRAVENOUS | Status: AC
  Administered 2017-10-05: 2 g via INTRAVENOUS
  Filled 2017-10-05: qty 100

## 2017-10-05 MED ORDER — LACTATED RINGERS IR SOLN
Status: DC | PRN
  Administered 2017-10-05: 1000 mL
  Administered 2017-10-05: 2000 mL

## 2017-10-05 MED ORDER — LABETALOL HCL 5 MG/ML IV SOLN
INTRAVENOUS | Status: DC | PRN
  Administered 2017-10-05 (×3): 5 mg via INTRAVENOUS

## 2017-10-05 MED ORDER — DEXAMETHASONE SODIUM PHOSPHATE 4 MG/ML IJ SOLN
INTRAMUSCULAR | Status: DC | PRN
  Administered 2017-10-05: 10 mg via INTRAVENOUS

## 2017-10-05 MED ORDER — KCL IN DEXTROSE-NACL 20-5-0.45 MEQ/L-%-% IV SOLN
INTRAVENOUS | Status: DC
  Administered 2017-10-05: 15:00:00 via INTRAVENOUS
  Filled 2017-10-05 (×2): qty 1000

## 2017-10-05 MED ORDER — METOPROLOL SUCCINATE ER 25 MG PO TB24
25.0000 mg | ORAL_TABLET | Freq: Every day | ORAL | Status: DC
  Administered 2017-10-05: 25 mg via ORAL
  Filled 2017-10-05: qty 1

## 2017-10-05 MED ORDER — OXYCODONE HCL 5 MG/5ML PO SOLN
5.0000 mg | Freq: Once | ORAL | Status: AC | PRN
  Filled 2017-10-05: qty 5

## 2017-10-05 MED ORDER — FENTANYL CITRATE (PF) 100 MCG/2ML IJ SOLN
INTRAMUSCULAR | Status: AC
  Filled 2017-10-05: qty 2

## 2017-10-05 MED ORDER — LAMOTRIGINE 100 MG PO TABS
200.0000 mg | ORAL_TABLET | Freq: Every day | ORAL | Status: DC
  Administered 2017-10-05: 200 mg via ORAL
  Filled 2017-10-05: qty 2

## 2017-10-05 MED ORDER — HYDROMORPHONE HCL 1 MG/ML IJ SOLN
INTRAMUSCULAR | Status: AC
Start: 2017-10-05 — End: 2017-10-05
  Filled 2017-10-05: qty 1

## 2017-10-05 MED ORDER — IOPAMIDOL (ISOVUE-300) INJECTION 61%
INTRAVENOUS | Status: AC
  Filled 2017-10-05: qty 50

## 2017-10-05 MED ORDER — HYDROCODONE-ACETAMINOPHEN 5-325 MG PO TABS
1.0000 | ORAL_TABLET | ORAL | Status: DC | PRN
  Administered 2017-10-05: 1 via ORAL
  Administered 2017-10-06: 2 via ORAL
  Administered 2017-10-06 (×2): 1 via ORAL
  Filled 2017-10-05 (×2): qty 2
  Filled 2017-10-05 (×2): qty 1

## 2017-10-05 MED ORDER — 0.9 % SODIUM CHLORIDE (POUR BTL) OPTIME
TOPICAL | Status: DC | PRN
  Administered 2017-10-05: 1000 mL

## 2017-10-05 MED ORDER — DEXAMETHASONE SODIUM PHOSPHATE 10 MG/ML IJ SOLN
INTRAMUSCULAR | Status: AC
  Filled 2017-10-05: qty 1

## 2017-10-05 MED ORDER — PANTOPRAZOLE SODIUM 40 MG PO TBEC
40.0000 mg | DELAYED_RELEASE_TABLET | Freq: Every day | ORAL | Status: DC
  Administered 2017-10-05: 40 mg via ORAL
  Filled 2017-10-05: qty 1

## 2017-10-05 MED ORDER — MIDAZOLAM HCL 5 MG/5ML IJ SOLN
INTRAMUSCULAR | Status: DC | PRN
  Administered 2017-10-05: 2 mg via INTRAVENOUS

## 2017-10-05 MED ORDER — SUGAMMADEX SODIUM 200 MG/2ML IV SOLN
INTRAVENOUS | Status: AC
  Filled 2017-10-05: qty 2

## 2017-10-05 MED ORDER — LIDOCAINE 2% (20 MG/ML) 5 ML SYRINGE
INTRAMUSCULAR | Status: AC
  Filled 2017-10-05: qty 5

## 2017-10-05 MED ORDER — ACETAMINOPHEN 500 MG PO TABS
1000.0000 mg | ORAL_TABLET | ORAL | Status: AC
  Administered 2017-10-05: 1000 mg via ORAL
  Filled 2017-10-05: qty 2

## 2017-10-05 MED ORDER — PROPOFOL 10 MG/ML IV BOLUS
INTRAVENOUS | Status: AC
  Filled 2017-10-05: qty 40

## 2017-10-05 MED ORDER — SCOPOLAMINE 1 MG/3DAYS TD PT72
MEDICATED_PATCH | TRANSDERMAL | Status: AC
  Filled 2017-10-05: qty 1

## 2017-10-05 MED ORDER — LURASIDONE HCL 40 MG PO TABS
80.0000 mg | ORAL_TABLET | Freq: Every day | ORAL | Status: DC
  Administered 2017-10-05 – 2017-10-06 (×2): 80 mg via ORAL
  Filled 2017-10-05 (×2): qty 2

## 2017-10-05 MED ORDER — FENTANYL CITRATE (PF) 100 MCG/2ML IJ SOLN
INTRAMUSCULAR | Status: DC | PRN
  Administered 2017-10-05 (×6): 50 ug via INTRAVENOUS

## 2017-10-05 MED ORDER — ONDANSETRON HCL 4 MG/2ML IJ SOLN
INTRAMUSCULAR | Status: DC | PRN
  Administered 2017-10-05: 4 mg via INTRAVENOUS

## 2017-10-05 MED ORDER — ONDANSETRON HCL 4 MG/2ML IJ SOLN
INTRAMUSCULAR | Status: AC
  Filled 2017-10-05: qty 2

## 2017-10-05 MED ORDER — CHLORHEXIDINE GLUCONATE CLOTH 2 % EX PADS: 6.0000 | MEDICATED_PAD | Freq: Once | CUTANEOUS | Status: DC

## 2017-10-05 MED ORDER — LACTATED RINGERS IV SOLN
INTRAVENOUS | Status: DC
  Administered 2017-10-05 (×2): via INTRAVENOUS

## 2017-10-05 MED ORDER — HYDROMORPHONE HCL 1 MG/ML IJ SOLN
0.2500 mg | INTRAMUSCULAR | Status: DC | PRN
  Administered 2017-10-05 (×4): 0.5 mg via INTRAVENOUS

## 2017-10-05 MED ORDER — LABETALOL HCL 5 MG/ML IV SOLN
INTRAVENOUS | Status: AC
  Filled 2017-10-05: qty 4

## 2017-10-05 MED ORDER — OXYCODONE HCL 5 MG PO TABS
ORAL_TABLET | ORAL | Status: AC
  Filled 2017-10-05: qty 1

## 2017-10-05 MED ORDER — CEFTRIAXONE SODIUM 2 G IJ SOLR
2.0000 g | INTRAMUSCULAR | Status: DC
  Administered 2017-10-05: 2 g via INTRAVENOUS
  Filled 2017-10-05 (×2): qty 2

## 2017-10-05 MED ORDER — PROMETHAZINE HCL 25 MG/ML IJ SOLN: 6.2500 mg | INTRAMUSCULAR | Status: DC | PRN

## 2017-10-05 MED ORDER — OXYCODONE HCL 5 MG PO TABS
5.0000 mg | ORAL_TABLET | Freq: Once | ORAL | Status: AC | PRN
  Administered 2017-10-05: 5 mg via ORAL

## 2017-10-05 SURGICAL SUPPLY — 43 items
ADH SKN CLS APL DERMABOND .7 (GAUZE/BANDAGES/DRESSINGS) ×1
APL SKNCLS STERI-STRIP NONHPOA (GAUZE/BANDAGES/DRESSINGS)
APPLIER CLIP 5 13 M/L LIGAMAX5 (MISCELLANEOUS)
APPLIER CLIP ROT 10 11.4 M/L (STAPLE) ×2
APR CLP MED LRG 11.4X10 (STAPLE) ×1
APR CLP MED LRG 5 ANG JAW (MISCELLANEOUS)
BAG SPEC RTRVL 10 TROC 200 (ENDOMECHANICALS) ×1
BENZOIN TINCTURE PRP APPL 2/3 (GAUZE/BANDAGES/DRESSINGS) IMPLANT
CABLE HIGH FREQUENCY MONO STRZ (ELECTRODE) ×2 IMPLANT
CHLORAPREP W/TINT 26ML (MISCELLANEOUS) ×2 IMPLANT
CHOLANGIOGRAM CATH TAUT (CATHETERS) ×2 IMPLANT
CLIP APPLIE 5 13 M/L LIGAMAX5 (MISCELLANEOUS) IMPLANT
CLIP APPLIE ROT 10 11.4 M/L (STAPLE) IMPLANT
COVER MAYO STAND STRL (DRAPES) ×2 IMPLANT
COVER SURGICAL LIGHT HANDLE (MISCELLANEOUS) ×2 IMPLANT
DECANTER SPIKE VIAL GLASS SM (MISCELLANEOUS) ×2 IMPLANT
DERMABOND ADVANCED (GAUZE/BANDAGES/DRESSINGS) ×1
DERMABOND ADVANCED .7 DNX12 (GAUZE/BANDAGES/DRESSINGS) IMPLANT
DRAPE C-ARM 42X120 X-RAY (DRAPES) ×2 IMPLANT
ELECT REM PT RETURN 15FT ADLT (MISCELLANEOUS) ×2 IMPLANT
GLOVE SURG SIGNA 7.5 PF LTX (GLOVE) ×2 IMPLANT
GOWN STRL REUS W/TWL XL LVL3 (GOWN DISPOSABLE) ×6 IMPLANT
HEMOSTAT SURGICEL 4X8 (HEMOSTASIS) IMPLANT
IV CATH 14GX2 1/4 (CATHETERS) ×2 IMPLANT
IV SET EXTENSION CATH 6 NF (IV SETS) ×2 IMPLANT
KIT BASIN OR (CUSTOM PROCEDURE TRAY) ×2 IMPLANT
POUCH RETRIEVAL ECOSAC 10 (ENDOMECHANICALS) ×1 IMPLANT
POUCH RETRIEVAL ECOSAC 10MM (ENDOMECHANICALS) ×1
SCISSORS LAP 5X35 DISP (ENDOMECHANICALS) ×2 IMPLANT
SET TUBE IRRIG SUCTION NO TIP (IRRIGATION / IRRIGATOR) ×2 IMPLANT
SLEEVE ADV FIXATION 5X100MM (TROCAR) ×2 IMPLANT
STOPCOCK 4 WAY LG BORE MALE ST (IV SETS) ×2 IMPLANT
STRIP CLOSURE SKIN 1/4X4 (GAUZE/BANDAGES/DRESSINGS) IMPLANT
SUT MNCRL AB 4-0 PS2 18 (SUTURE) ×2 IMPLANT
SUT VICRYL 0 UR6 27IN ABS (SUTURE) ×2 IMPLANT
SYR 10ML ECCENTRIC (SYRINGE) ×2 IMPLANT
TOWEL OR 17X26 10 PK STRL BLUE (TOWEL DISPOSABLE) ×2 IMPLANT
TOWEL OR NON WOVEN STRL DISP B (DISPOSABLE) ×2 IMPLANT
TRAY LAPAROSCOPIC (CUSTOM PROCEDURE TRAY) ×2 IMPLANT
TROCAR ADV FIXATION 11X100MM (TROCAR) ×1 IMPLANT
TROCAR ADV FIXATION 5X100MM (TROCAR) ×2 IMPLANT
TROCAR XCEL BLUNT TIP 100MML (ENDOMECHANICALS) ×2 IMPLANT
TUBING INSUF HEATED (TUBING) ×2 IMPLANT

## 2017-10-05 NOTE — Anesthesia Procedure Notes (Signed)
Procedure Name: Intubation Date/Time: 10/05/2017 8:57 AM Performed by: Claudia Desanctis, CRNA Pre-anesthesia Checklist: Patient identified, Emergency Drugs available, Suction available and Patient being monitored Patient Re-evaluated:Patient Re-evaluated prior to induction Oxygen Delivery Method: Circle system utilized Preoxygenation: Pre-oxygenation with 100% oxygen Induction Type: IV induction Ventilation: Mask ventilation without difficulty and Oral airway inserted - appropriate to patient size Laryngoscope Size: 2 and Miller Grade View: Grade I Tube type: Oral Tube size: 7.0 mm Number of attempts: 1 Airway Equipment and Method: Bougie stylet Placement Confirmation: ETT inserted through vocal cords under direct vision,  positive ETCO2 and breath sounds checked- equal and bilateral Secured at: 21 cm Tube secured with: Tape Dental Injury: Teeth and Oropharynx as per pre-operative assessment  Comments: Grade 1 view, but difficult to get ett thru cords, bougie placed easily and ett advanced under direct vision without difficulty

## 2017-10-05 NOTE — Anesthesia Preprocedure Evaluation (Addendum)
Anesthesia Evaluation  Patient identified by MRN, date of birth, ID band Patient awake    Reviewed: Allergy & Precautions, NPO status , Patient's Chart, lab work & pertinent test results, reviewed documented beta blocker date and time   History of Anesthesia Complications (+) PONV  Airway Mallampati: III  TM Distance: >3 FB Neck ROM: Full    Dental  (+)    Pulmonary neg pulmonary ROS,    Pulmonary exam normal breath sounds clear to auscultation       Cardiovascular hypertension, Pt. on home beta blockers Normal cardiovascular exam Rhythm:Regular Rate:Normal  ECG: SR, rate 70  ECHO: Normal LV size with EF 60-65%. Normal RV size and systolic function. No significant valvular abnormalities.    Neuro/Psych PSYCHIATRIC DISORDERS Anxiety Depression Bipolar Disorder negative neurological ROS     GI/Hepatic Neg liver ROS, hiatal hernia,   Endo/Other  negative endocrine ROS  Renal/GU negative Renal ROS     Musculoskeletal negative musculoskeletal ROS (+)   Abdominal (+) + obese,   Peds  Hematology HLD   Anesthesia Other Findings GALLBLADDER DISEASE Ring on right hand  Reproductive/Obstetrics                            Anesthesia Physical Anesthesia Plan  ASA: III  Anesthesia Plan: General   Post-op Pain Management:    Induction: Intravenous  PONV Risk Score and Plan: 4 or greater and Scopolamine patch - Pre-op, Midazolam, Dexamethasone, Ondansetron and Treatment may vary due to age or medical condition  Airway Management Planned: Oral ETT  Additional Equipment:   Intra-op Plan:   Post-operative Plan: Extubation in OR  Informed Consent: I have reviewed the patients History and Physical, chart, labs and discussed the procedure including the risks, benefits and alternatives for the proposed anesthesia with the patient or authorized representative who has indicated his/her  understanding and acceptance.   Dental advisory given  Plan Discussed with: CRNA  Anesthesia Plan Comments:         Anesthesia Quick Evaluation

## 2017-10-05 NOTE — Discharge Instructions (Signed)
[facility] - DISCHARGE INSTRUCTIONS TO PATIENT  Activity:  Driving - in 2 to 3 days, if doing well.   Lifting - No lifting more than 15 pounds for 10 days.  Wound Care:   Leave the incision dry until Sunday, 10/07/2017, then may shower  Diet:  As tolerated  Follow up appointment:  Call Dr. Pollie Friar office [facility]) at [phone] for an appointment in 2 weeks.  Medications and dosages:  Resume your home medications.  You have a prescription for:  Vicodin  Call Dr. Lucia Gaskins or his office  [phone]) if you have:  Temperature greater than 100.4,  Persistent nausea and vomiting,  Severe uncontrolled pain,  Redness, tenderness, or signs of infection (pain, swelling, redness, odor or green/yellow discharge around the site),  Difficulty breathing, headache or visual disturbances,  Any other questions or concerns you may have after discharge.  In an emergency, call 911 or go to an Emergency Department at a nearby hospital.

## 2017-10-05 NOTE — Interval H&P Note (Signed)
History and Physical Interval Note:  10/05/2017 8:45 AM  Sherry Strong [id number]  has presented today for surgery, with the diagnosis of GALLBLADDER DISEASE  The various methods of treatment have been discussed with the patient and family.   [name], fiancee, is at the bedside.  After consideration of risks, benefits and other options for treatment, the patient has consented to  Procedure(s): LAPAROSCOPIC CHOLECYSTECTOMY WITH INTRAOPERATIVE CHOLANGIOGRAM (N/A) as a surgical intervention .  The patient's history has been reviewed, patient examined, no change in status, stable for surgery.  I have reviewed the patient's chart and labs.  Questions were answered to the patient's satisfaction.     [name]

## 2017-10-05 NOTE — Op Note (Signed)
10/05/2017  11:10 AM  PATIENT:  Sherry Strong [id number], 57 y.o., female, MRN: [id number]  PREOP DIAGNOSIS:  GALLBLADDER DISEASE  POSTOP DIAGNOSIS:   Acute and Chronic cholecystitis, cholelithiasis  PROCEDURE:   Procedure(s): LAPAROSCOPIC CHOLECYSTECTOMY WITH INTRAOPERATIVE CHOLANGIOGRAM (5 trocars)  SURGEON:   Alphonsa Overall, M.D.  Terrence DupontRockne Coons, M.D.  ANESTHESIA:   general  Anesthesiologist: Murvin Natal, MD CRNA: Claudia Desanctis, CRNA; Montel Clock, CRNA  General  ASA: 2  EBL:  50  ml  BLOOD ADMINISTERED: none  DRAINS: none   LOCAL MEDICATIONS USED:   30 cc 1/2% marcaine  SPECIMEN:   Gall bladder  COUNTS CORRECT:  YES  INDICATIONS FOR PROCEDURE:  Sherry Strong is a 57 y.o. (DOB: 1961/03/13) white female whose primary care physician is Berkley Harvey, NP and comes for cholecystectomy.   The indications and risks of the gall bladder surgery were explained to the patient.  The risks include, but are not limited to, infection, bleeding, common bile duct injury and open surgery.  SURGERY:  The patient was taken to OR room #1 at [facility].  The abdomen was prepped with chloroprep.  The patient was given 2 gm Ancef at the beginning of the operation.   A time out was held and the surgical checklist run.   An infraumbilical incision was made into the abdominal cavity.  A 12 mm Hasson trocar was inserted into the abdominal cavity through the infraumbilical incision and secured with a 0 Vicryl suture.  Four additional trocars were inserted: a 10 mm trocar in the sub-xiphoid location, a 5 mm trocar in the right mid subcostal area, a 5 mm trocar midway between the xiphoid and umbilicus, and a 5 mm trocar in the right lateral subcostal area.   The abdomen was explored and the liver, stomach, and bowel that could be seen were unremarkable.   The gall bladder was acutely and chronically inflamed.  It had omentum stuck to the whole gall bladder.  I had to  decompress the gall bladder and aspirated "white" bile.   I grasped the gall bladder and rotated it cephalad.  Disssection was carried down to the gall bladder/cystic duct junction and the cystic duct isolated.  The entire gall bladder was difficult to dissect because of the acute and chronic inflammation.  A clip was placed on the gall bladder side of the cystic duct.   An intra-operative cholangiogram was shot.   The intra-operative cholangiogram was shot using a cut off Taut catheter placed through a 14 gauge angiocath in the RUQ.  The Taut catheter was inserted in the cut cystic duct and secured with an endoclip.  A cholangiogram was shot with 6 cc of 1/2 strength Isoview.  Using fluoroscopy, the cholangiogram showed the flow of contrast into the common bile duct, up the hepatic radicals, and into the duodenum.  There was no mass or obstruction.  This was a normal intra-operative cholangiogram.   The Taut catheter was removed.  The cystic duct was tripley endoclipped and the cystic artery was identified and clipped.  The gall bladder was bluntly and sharpley dissected from the gall bladder bed.   After the gall bladder was removed from the liver, the gall bladder bed and Tri[place] of [place] were inspected.  There was no bleeding or bile leak.  The gall bladder was placed in a endocatch bag and delivered through the umbilicus.  The abdomen was irrigated with 3,000 cc saline.  The trocars were then removed.  I infiltrated 30 cc of 1/2% Marcaine into the incisions.  The umbilical port closed with a 0 Vicryl suture and the skin closed with 4-0 Monocryl.  The skin was painted with DermaBond.  The patient's sponge and needle count were correct.  The patient was transported to the RR in good condition.  Alphonsa Overall, MD, [name] Pager: [phone] Office phone:  [phone]

## 2017-10-05 NOTE — Transfer of Care (Signed)
Immediate Anesthesia Transfer of Care Note  Patient: Sherry Strong [id number]  Procedure(s) Performed: LAPAROSCOPIC CHOLECYSTECTOMY WITH INTRAOPERATIVE CHOLANGIOGRAM (N/A )  Patient Location: PACU  Anesthesia Type:General  Level of Consciousness: drowsy and patient cooperative  Airway & Oxygen Therapy: Patient Spontanous Breathing and Patient connected to face mask  Post-op Assessment: Report given to RN and Post -op Vital signs reviewed and stable  Post vital signs: Reviewed and stable  Last Vitals:  Vitals:   10/05/17 0711  BP: (!) 146/98  Pulse: 93  Resp: 16  SpO2: 97%    Last Pain:  Vitals:   10/05/17 0719  TempSrc:   PainSc: 4          Complications: No apparent anesthesia complications

## 2017-10-05 NOTE — Anesthesia Postprocedure Evaluation (Signed)
Anesthesia Post Note  Patient: Nariya Neumeyer [id number]  Procedure(s) Performed: LAPAROSCOPIC CHOLECYSTECTOMY WITH INTRAOPERATIVE CHOLANGIOGRAM (N/A )     Patient location during evaluation: PACU Anesthesia Type: General Level of consciousness: awake and alert Pain management: pain level controlled Vital Signs Assessment: post-procedure vital signs reviewed and stable Respiratory status: spontaneous breathing, nonlabored ventilation, respiratory function stable and patient connected to nasal cannula oxygen Cardiovascular status: blood pressure returned to baseline and stable Postop Assessment: no apparent nausea or vomiting Anesthetic complications: no    Last Vitals:  Vitals:   10/05/17 1315 10/05/17 1329  BP: (!) 149/84 (!) 153/91  Pulse:  79  Resp:    Temp: 37.1 C 36.8 C  SpO2:  99%    Last Pain:  Vitals:   10/05/17 1353  TempSrc:   PainSc: 3                  Ryan P Ellender

## 2017-10-06 ENCOUNTER — Encounter (HOSPITAL_COMMUNITY): Payer: Self-pay | Admitting: Surgery

## 2017-10-06 DIAGNOSIS — K8000 Calculus of gallbladder with acute cholecystitis without obstruction: Secondary | ICD-10-CM | POA: Diagnosis not present

## 2017-10-06 MED ORDER — HYDROCODONE-ACETAMINOPHEN 5-325 MG PO TABS: 1.0000 | ORAL_TABLET | Freq: Four times a day (QID) | ORAL | 0 refills | Status: DC | PRN

## 2017-10-06 MED ORDER — AMOXICILLIN 500 MG PO CAPS
500.0000 mg | ORAL_CAPSULE | Freq: Once | ORAL | Status: AC
  Administered 2017-10-06: 500 mg via ORAL
  Filled 2017-10-06 (×2): qty 1

## 2017-10-06 NOTE — Progress Notes (Signed)
1 Day Post-Op   Subjective/Chief Complaint: Complains of soreness but wants to go home   Objective: Vital signs in last 24 hours: Temp:  [97.6 F (36.4 C)-99.1 F (37.3 C)] 99.1 F (37.3 C) (01/12 1000) Pulse Rate:  [68-82] 76 (01/12 1000) Resp:  [16-18] 18 (01/12 1000) BP: (120-153)/(65-91) 129/74 (01/12 1000) SpO2:  [97 %-100 %] 99 % (01/12 1000) Last BM Date: 10/04/17  Intake/Output from previous day: 01/11 0701 - 01/12 0700 In: 3010 [P.O.:360; I.V.:2600; IV Piggyback:50] Out: 9892 [Urine:2450; Blood:20] Intake/Output this shift: Total I/O In: 350 [P.O.:350] Out: 900 [Urine:900]  General appearance: alert and cooperative Resp: clear to auscultation bilaterally Cardio: regular rate and rhythm GI: soft, mild tenderness.  Lab Results:  Recent Labs    10/04/17 1137  WBC 7.2  HGB 12.7  HCT 38.3  PLT 237   BMET Recent Labs    10/04/17 1137  NA 139  K 4.1  CL 106  CO2 25  GLUCOSE 121*  BUN 9  CREATININE 0.74  CALCIUM 9.3   PT/INR No results for input(s): LABPROT, INR in the last 72 hours. ABG No results for input(s): PHART, HCO3 in the last 72 hours.  Invalid input(s): PCO2, PO2  Studies/Results: Dg Cholangiogram Operative  Result Date: 10/05/2017 CLINICAL DATA:  Intraoperative cholangiogram during laparoscopic cholecystectomy. EXAM: INTRAOPERATIVE CHOLANGIOGRAM FLUOROSCOPY TIME:  10 seconds COMPARISON:  Abdominal ultrasound - 10/01/2017 FINDINGS: Intraoperative cholangiographic images of the right upper abdominal quadrant during laparoscopic cholecystectomy are provided for review. Surgical clips overlie the expected location of the gallbladder fossa. Contrast injection demonstrates selective cannulation of the central aspect of the cystic duct. There is passage of contrast through the central aspect of the cystic duct with filling of a non dilated common bile duct. There is passage of contrast though the CBD and into the descending portion of the  duodenum. There is minimal reflux of injected contrast into the common hepatic duct and central aspect of the non dilated intrahepatic biliary system. There are no discrete filling defects within the opacified portions of the biliary system to suggest the presence of choledocholithiasis. IMPRESSION: No evidence of choledocholithiasis. Electronically Signed   By: Sandi Mariscal M.D.   On: 10/05/2017 10:39    Anti-infectives: Anti-infectives (From admission, onward)   Start     Dose/Rate Route Frequency Ordered Stop   10/06/17 1200  amoxicillin (AMOXIL) capsule 500 mg     500 mg Oral  Once 10/06/17 1153     10/05/17 1500  cefTRIAXone (ROCEPHIN) 2 g in dextrose 5 % 50 mL IVPB     2 g 100 mL/hr over 30 Minutes Intravenous Every 24 hours 10/05/17 1347     10/05/17 0708  ceFAZolin (ANCEF) IVPB 2g/100 mL premix     2 g 200 mL/hr over 30 Minutes Intravenous On call to O.R. 10/05/17 0708 10/05/17 0913      Assessment/Plan: s/p Procedure(s): LAPAROSCOPIC CHOLECYSTECTOMY WITH INTRAOPERATIVE CHOLANGIOGRAM (N/A) Advance diet  Will give amoxicillin for strep throat discharge  LOS: 0 days    TOTH III,Kenlee Vogt S 10/06/2017

## 2017-10-06 NOTE — Plan of Care (Signed)
Con to monitor

## 2017-10-09 NOTE — Discharge Summary (Signed)
Physician Discharge Summary  Patient ID: Sherry Strong MRN: [id number] DOB/AGE: 05-28-1961 57 y.o.  Admit date: 10/05/2017 Discharge date: 10/06/2017  Admission Diagnoses:  Chronic and acute cholecystitis Bipolar disorder/depression/on disability Hypertension Hypercholesterolemia.  Discharge Diagnoses:  Same  Active Problems:   Cholecystitis, acute with cholelithiasis   PROCEDURES: *Laparoscopic cholecystectomy with intraoperative cholangiogram 10/05/17 Dr. Elby Beck Course:        The patient is a 57 year old female who presents with a complaint of gall bladder disease.             The PCP is Eldridge Abrahams, NP             The patient was referred by Eldridge Abrahams, NP.             She comes by herself. But she said that her boyfrind, Jeronimo Greaves, is seeing Dr. Ninfa Linden for a recurrent hernia.  She developed some epigastric and right upper quadrant pain this past weekend. She went to an urgent care Center to be seen. She talked to Wandra Arthurs, her primary care provider, who obtained an ultrasound of her abdomen. She had an abdominal ultrasound on 01 October 2016 which showed a 1.8 cm stone within the gallbladder neck. There was no associated gallbladder wall thickening. Her liver had some increased echogenicity, possible hepatic steatosis. She's had fairly persistent right upper quadrant pain since this began. She's had no jaundice, no nausea or vomiting, or no fever. She also developed a sore throat and tested positive for strep throat. She was started on amoxicillin on Monday, 7 January.  She's had no history of stomach or liver disease. Instead of a colonoscopy, she underwent a Cologuard test which was negative. She's had a C-section for her son 21 years. Prior to that she had a laparoscopy for endometriosis.   I discussed with the patient the indications and risks of gall bladder surgery. The primary risks of gall bladder  surgery include, but are not limited to, bleeding, infection, common bile duct injury, and open surgery. There is also the risk that the patient may have continued symptoms after surgery. We discussed the typical post-operative recovery course. I tried to answer the patient's questions. I gave the patient literature about gall bladder surgery.  Patient was seen and evaluated by Dr. Lucia Gaskins and admitted.  Return to the operating room later on the day of admission.  She underwent the procedure as described above.  Postop she did well was discharged home the following day Dr. Autumn Messing  Follow-up as listed below.  I did not see the patient or participate in her care discharge summary is from the chart notes. Condition on discharge: Improved.  CBC Latest Ref Rng & Units 10/04/2017 07/12/2017  WBC 4.0 - 10.5 K/uL 7.2 6.5  Hemoglobin 12.0 - 15.0 g/dL 12.7 13.1  Hematocrit 36.0 - 46.0 % 38.3 40.1  Platelets 150 - 400 K/uL 237 -   CMP Latest Ref Rng & Units 10/04/2017 07/12/2017  Glucose 65 - 99 mg/dL 121(H) 91  BUN 6 - 20 mg/dL 9 9  Creatinine 0.44 - 1.00 mg/dL 0.74 0.85  Sodium 135 - 145 mmol/L 139 142  Potassium 3.5 - 5.1 mmol/L 4.1 4.3  Chloride 101 - 111 mmol/L 106 102  CO2 22 - 32 mmol/L 25 22  Calcium 8.9 - 10.3 mg/dL 9.3 9.2  Total Protein 6.5 - 8.1 g/dL 7.7 7.3  Total Bilirubin 0.3 - 1.2 mg/dL 0.8 0.2  Alkaline Phos 38 -  126 U/L 111 82  AST 15 - 41 U/L 63(H) 37  ALT 14 - 54 U/L 77(H) 50(H)    Disposition: 01-Home or Self Care  Discharge Instructions    Call MD for:  difficulty breathing, headache or visual disturbances   Complete by:  As directed    Call MD for:  extreme fatigue   Complete by:  As directed    Call MD for:  hives   Complete by:  As directed    Call MD for:  persistant dizziness or light-headedness   Complete by:  As directed    Call MD for:  persistant nausea and vomiting   Complete by:  As directed    Call MD for:  redness, tenderness, or signs of  infection (pain, swelling, redness, odor or green/yellow discharge around incision site)   Complete by:  As directed    Call MD for:  severe uncontrolled pain   Complete by:  As directed    Call MD for:  temperature >100.4   Complete by:  As directed    Diet - low sodium heart healthy   Complete by:  As directed    Discharge instructions   Complete by:  As directed    May shower. Low fat diet. No heavy lifting. Continue amoxicillin   Increase activity slowly   Complete by:  As directed    No wound care   Complete by:  As directed      Allergies as of 10/06/2017      Reactions   Vistaril  [hydroxyzine Hcl] Swelling      Medication List    TAKE these medications   ALPRAZolam 0.5 MG tablet Commonly known as:  XANAX Take 0.5 mg by mouth 2 (two) times daily as needed. For anxiety   amoxicillin 875 MG tablet Commonly known as:  AMOXIL Take 875 mg by mouth 2 (two) times daily. For 10 days   aspirin EC 81 MG tablet Take 81 mg by mouth.   dexlansoprazole 60 MG capsule Commonly known as:  DEXILANT Take 60 mg by mouth daily.   dicyclomine 20 MG tablet Commonly known as:  BENTYL Take 20 mg by mouth See admin instructions. 4 times daily before meals and bedtime as needed   fenofibrate 145 MG tablet Commonly known as:  TRICOR Take 145 mg by mouth daily.   HYDROcodone-acetaminophen 5-325 MG tablet Commonly known as:  NORCO/VICODIN Take 1-2 tablets by mouth every 6 (six) hours as needed for moderate pain.   ibuprofen 800 MG tablet Commonly known as:  ADVIL,MOTRIN Take 800 mg by mouth every 8 (eight) hours as needed for pain.   ketoconazole 2 % shampoo Commonly known as:  NIZORAL Apply 1 application topically 2 (two) times a week.   lamoTRIgine 200 MG tablet Commonly known as:  LAMICTAL Take 1 tablet (200 mg total) by mouth daily.   lurasidone 80 MG Tabs tablet Commonly known as:  LATUDA Take 1 tablet (80 mg total) by mouth daily with breakfast.   meloxicam 15 MG  tablet Commonly known as:  MOBIC Take 7.5-15 mg by mouth daily.   metoprolol succinate 25 MG 24 hr tablet Commonly known as:  TOPROL-XL Take 25 mg by mouth daily.   pantoprazole 40 MG tablet Commonly known as:  PROTONIX Take 40 mg by mouth daily.   pravastatin 40 MG tablet Commonly known as:  PRAVACHOL Take 40 mg by mouth every evening.   VITAMIN B 12 PO Take 1 tablet by mouth  daily.   vitamin C 500 MG tablet Commonly known as:  ASCORBIC ACID Take 500 mg by mouth daily.   Vitamin D (Ergocalciferol) 50000 units Caps capsule Commonly known as:  DRISDOL Take 1 capsule (50,000 Units total) by mouth every 7 (seven) days. What changed:  additional instructions      Follow-up Information    Alphonsa Overall, MD Follow up in 2 week(s).   Specialty:  General Surgery Contact information: [address] [place] [id number] [phone]           Signed: Earnstine Regal 10/09/2017, 4:05 PM

## 2017-10-11 ENCOUNTER — Ambulatory Visit (INDEPENDENT_AMBULATORY_CARE_PROVIDER_SITE_OTHER): Payer: Medicare Other | Admitting: Family Medicine

## 2017-10-11 VITALS — BP 126/85 | HR 74 | Temp 98.1°F | Ht 64.0 in | Wt 192.0 lb

## 2017-10-11 DIAGNOSIS — E559 Vitamin D deficiency, unspecified: Secondary | ICD-10-CM | POA: Diagnosis not present

## 2017-10-11 DIAGNOSIS — E669 Obesity, unspecified: Secondary | ICD-10-CM | POA: Diagnosis not present

## 2017-10-11 DIAGNOSIS — Z6833 Body mass index (BMI) 33.0-33.9, adult: Secondary | ICD-10-CM | POA: Diagnosis not present

## 2017-10-11 DIAGNOSIS — E7849 Other hyperlipidemia: Secondary | ICD-10-CM

## 2017-10-11 MED ORDER — VITAMIN D (ERGOCALCIFEROL) 1.25 MG (50000 UNIT) PO CAPS: 50000.0000 [IU] | ORAL_CAPSULE | ORAL | 0 refills | Status: DC

## 2017-10-11 NOTE — Progress Notes (Signed)
Office: [phone]  /  Fax: [phone]   HPI:   Chief Complaint: OBESITY Sherry Strong is here to discuss her progress with her obesity treatment plan. She is on the lower carbohydrate, vegetable and lean protein rich diet plan and is following her eating plan approximately 0 % of the time. She states she is exercising 0 minutes 0 times per week. Sherry Strong hasn't been following diet secondary to cholecystectomy that she had 6 days ago. She is only eating applesauce and she is worried about pain and intolerance to fatty foods. She is going to start solid foods today if able to.  Her weight is 192 lb (87.1 kg) today and has had a weight loss of 6 pounds over a period of 2 weeks since her last visit. She has lost 14 lbs since starting treatment with [name].  Vitamin D deficiency Sherry Strong has a diagnosis of vitamin D deficiency. She is currently taking prescription Vit D and denies fatigue, nausea, vomiting or muscle weakness.  Hyperlipidemia Sherry Strong has hyperlipidemia and has been trying to improve her cholesterol levels with intensive lifestyle modification including a low saturated fat diet, exercise and weight loss. She is on statin and Tricor and she is avoiding fatty foods, secondary to cholecystectomy. She denies any chest pain, claudication or myalgias.  ALLERGIES: Allergies  Allergen Reactions  . Vistaril  [Hydroxyzine Hcl] Swelling    MEDICATIONS: Current Outpatient Medications on File Prior to Visit  Medication Sig Dispense Refill  . ALPRAZolam (XANAX) 0.5 MG tablet Take 0.5 mg by mouth 2 (two) times daily as needed. For anxiety    . amoxicillin (AMOXIL) 875 MG tablet Take 875 mg by mouth 2 (two) times daily. For 10 days  0  . aspirin EC 81 MG tablet Take 81 mg by mouth.    . dexlansoprazole (DEXILANT) 60 MG capsule Take 60 mg by mouth daily.    . fenofibrate (TRICOR) 145 MG tablet Take 145 mg by mouth daily.  1  . ibuprofen (ADVIL,MOTRIN) 800 MG tablet Take 800 mg by mouth every 8 (eight) hours  as needed for pain.    Marland Kitchen lamoTRIgine (LAMICTAL) 200 MG tablet Take 1 tablet (200 mg total) by mouth daily. 90 tablet 0  . lurasidone (LATUDA) 80 MG TABS tablet Take 1 tablet (80 mg total) by mouth daily with breakfast. 90 tablet 0  . metoprolol succinate (TOPROL-XL) 25 MG 24 hr tablet Take 25 mg by mouth daily.     . pantoprazole (PROTONIX) 40 MG tablet Take 40 mg by mouth daily.    . pravastatin (PRAVACHOL) 40 MG tablet Take 40 mg by mouth every evening.    . vitamin C (ASCORBIC ACID) 500 MG tablet Take 500 mg by mouth daily.    . Vitamin D, Ergocalciferol, (DRISDOL) 50000 units CAPS capsule Take 1 capsule (50,000 Units total) by mouth every 7 (seven) days. (Patient taking differently: Take 50,000 Units by mouth every 7 (seven) days. On Sunday) 4 capsule 0   No current facility-administered medications on file prior to visit.     PAST MEDICAL HISTORY: Past Medical History:  Diagnosis Date  . Anxiety   . Bipolar disorder ([place])   . Complication of anesthesia   . Depression   . GERD (gastroesophageal reflux disease)   . Heart murmur    echo- 08/30/15-epic   . History of hiatal hernia   . HLD (hyperlipidemia)   . HTN (hypertension)   . Infertility, female   . Menopause   . PONV (postoperative nausea  and vomiting)     PAST SURGICAL HISTORY: Past Surgical History:  Procedure Laterality Date  . CESAREAN SECTION    . CHOLECYSTECTOMY N/A 10/05/2017   Procedure: LAPAROSCOPIC CHOLECYSTECTOMY WITH INTRAOPERATIVE CHOLANGIOGRAM;  Surgeon: Alphonsa Overall, MD;  Location: [facility];  Service: General;  Laterality: N/A;  . LAPAROSCOPY      SOCIAL HISTORY: Social History   Tobacco Use  . Smoking status: Never Smoker  . Smokeless tobacco: Never Used  Substance Use Topics  . Alcohol use: Yes    Alcohol/week: 3.0 - 3.6 oz    Types: 5 - 6 Cans of beer per week    Comment: occas   . Drug use: No    FAMILY HISTORY: Family History  Problem Relation Age of Onset  . Heart disease Mother   .  Depression Mother   . Hyperlipidemia Father   . Hypertension Father   . Heart disease Father   . Cancer Father   . Depression Father   . Anxiety disorder Father   . Sleep apnea Father     ROS: Review of Systems  Constitutional: Positive for weight loss. Negative for malaise/fatigue.  Cardiovascular: Negative for chest pain and claudication.  Gastrointestinal: Negative for nausea and vomiting.  Musculoskeletal: Negative for myalgias.       Negative muscle weakness    PHYSICAL EXAM: Blood pressure 126/85, pulse 74, temperature 98.1 F (36.7 C), temperature source Oral, height 5\' 4"  (1.626 m), weight 192 lb (87.1 kg), SpO2 98 %. Body mass index is 32.96 kg/m. Physical Exam  Constitutional: She is oriented to person, place, and time. She appears well-developed and well-nourished.  Cardiovascular: Normal rate.  Pulmonary/Chest: Effort normal.  Musculoskeletal: Normal range of motion.  Neurological: She is oriented to person, place, and time.  Skin: Skin is warm and dry.  Psychiatric: She has a normal mood and affect. Her behavior is normal.  Vitals reviewed.   RECENT LABS AND TESTS: BMET    Component Value Date/Time   NA 139 10/04/2017 1137   NA 142 07/12/2017 1055   K 4.1 10/04/2017 1137   CL 106 10/04/2017 1137   CO2 25 10/04/2017 1137   GLUCOSE 121 (H) 10/04/2017 1137   BUN 9 10/04/2017 1137   BUN 9 07/12/2017 1055   CREATININE 0.74 10/04/2017 1137   CALCIUM 9.3 10/04/2017 1137   GFRNONAA >60 10/04/2017 1137   GFRAA >60 10/04/2017 1137   Lab Results  Component Value Date   HGBA1C 5.4 07/12/2017   Lab Results  Component Value Date   INSULIN 20.6 07/12/2017   CBC    Component Value Date/Time   WBC 7.2 10/04/2017 1137   RBC 3.90 10/04/2017 1137   HGB 12.7 10/04/2017 1137   HGB 13.1 07/12/2017 1055   HCT 38.3 10/04/2017 1137   HCT 40.1 07/12/2017 1055   PLT 237 10/04/2017 1137   MCV 98.2 10/04/2017 1137   MCV 99 (H) 07/12/2017 1055   MCH 32.6  10/04/2017 1137   MCHC 33.2 10/04/2017 1137   RDW 13.1 10/04/2017 1137   RDW 13.1 07/12/2017 1055   LYMPHSABS 1.2 10/04/2017 1137   LYMPHSABS 1.5 07/12/2017 1055   MONOABS 0.6 10/04/2017 1137   EOSABS 0.1 10/04/2017 1137   EOSABS 0.1 07/12/2017 1055   BASOSABS 0.0 10/04/2017 1137   BASOSABS 0.0 07/12/2017 1055   Iron/TIBC/Ferritin/ %Sat No results found for: IRON, TIBC, FERRITIN, IRONPCTSAT Lipid Panel     Component Value Date/Time   CHOL 225 (H) 07/12/2017 1055  TRIG 105 07/12/2017 1055   HDL 53 07/12/2017 1055   LDLCALC 151 (H) 07/12/2017 1055   Hepatic Function Panel     Component Value Date/Time   PROT 7.7 10/04/2017 1137   PROT 7.3 07/12/2017 1055   ALBUMIN 4.0 10/04/2017 1137   ALBUMIN 4.8 07/12/2017 1055   AST 63 (H) 10/04/2017 1137   ALT 77 (H) 10/04/2017 1137   ALKPHOS 111 10/04/2017 1137   BILITOT 0.8 10/04/2017 1137   BILITOT 0.2 07/12/2017 1055      Component Value Date/Time   TSH 2.420 07/12/2017 1055  Results for [place], MADDUX FIRST (MRN [id number]) as of 10/11/2017 16:21  Ref. Range 07/12/2017 10:55  Vitamin D, 25-Hydroxy Latest Ref Range: 30.0 - 100.0 ng/mL 25.9 (L)    ASSESSMENT AND PLAN: Vitamin D deficiency - Plan: Vitamin D, Ergocalciferol, (DRISDOL) 50000 units CAPS capsule  Other hyperlipidemia  Class 1 obesity with serious comorbidity and body mass index (BMI) of 33.0 to 33.9 in adult, unspecified obesity type  PLAN:  Vitamin D Deficiency Latiesha was informed that low vitamin D levels contributes to fatigue and are associated with obesity, breast, and colon cancer. Tamsen agrees to continue taking prescription Vit D @50 ,000 IU every week #4 and we will refill for 1 month. She will follow up for routine testing of vitamin D, at least 2-3 times per year. She was informed of the risk of over-replacement of vitamin D and agrees to not increase her dose unless she discusses this with [name] first. Vivi agrees to follow up with our clinic in 2  weeks.  Hyperlipidemia Bellami was informed of the American Heart Association Guidelines emphasizing intensive lifestyle modifications as the first line treatment for hyperlipidemia. We discussed many lifestyle modifications today in depth, and Camela will continue to work on decreasing saturated fats such as fatty red meat, butter and many fried foods. She will also increase vegetables and lean protein in her diet and continue to work on exercise and weight loss efforts. Vincie will continue her medications as prescribed and she agrees to follow up with our clinic in 2 weeks.  Obesity Molly is currently in the action stage of change. As such, her goal is to continue with weight loss efforts She has agreed to follow a lower carbohydrate, vegetable and lean protein rich diet plan Aviendha has been instructed to work up to a goal of 150 minutes of combined cardio and strengthening exercise per week for weight loss and overall health benefits. We discussed the following Behavioral Modification Strategies today: increasing lean protein intake, increasing vegetables, and increase H20 intake Encourage protein intake as tolerated.  Alison has agreed to follow up with our clinic in 2 weeks. She was informed of the importance of frequent follow up visits to maximize her success with intensive lifestyle modifications for her multiple health conditions.   OBESITY BEHAVIORAL INTERVENTION VISIT  Today's visit was # 5 out of 22.  Starting weight: 206 lbs Starting date: 07/12/17 Today's weight : 192 lbs  Today's date: 10/11/2017 Total lbs lost to date: 14 (Patients must lose 7 lbs in the first 6 months to continue with counseling)   ASK: We discussed the diagnosis of obesity with Shenika Quint DTOIZTI today and Lilyanne agreed to give [name] permission to discuss obesity behavioral modification therapy today.  ASSESS: Devri has the diagnosis of obesity and her BMI today is 32.94 Valentine is in the action stage of change    ADVISE: Cerita was educated on the multiple health risks  of obesity as well as the benefit of weight loss to improve her health. She was advised of the need for long term treatment and the importance of lifestyle modifications.  AGREE: Multiple dietary modification options and treatment options were discussed and  Janyla agreed to the above obesity treatment plan.  I, Trixie Dredge, am acting as transcriptionist for Dennard Nip, MD  I have reviewed the above documentation for accuracy and completeness, and I agree with the above. -Dennard Nip, MD

## 2017-10-22 ENCOUNTER — Ambulatory Visit (INDEPENDENT_AMBULATORY_CARE_PROVIDER_SITE_OTHER): Payer: Medicare Other | Admitting: Psychiatry

## 2017-10-22 DIAGNOSIS — F3131 Bipolar disorder, current episode depressed, mild: Secondary | ICD-10-CM

## 2017-10-22 DIAGNOSIS — F411 Generalized anxiety disorder: Secondary | ICD-10-CM | POA: Diagnosis not present

## 2017-10-22 DIAGNOSIS — G4700 Insomnia, unspecified: Secondary | ICD-10-CM

## 2017-10-23 NOTE — Progress Notes (Signed)
   THERAPIST PROGRESS NOTE  Session Time:3:00-4:00  Participation Level:Active  Behavioral Response:CasualAlertActiveEuthymic  Type of Therapy: Individual Therapy  Treatment Goals addressed: Anxiety  Interventions:Strength-based and Supportive  Summary: Sherry Strong [id number] a 56y.o.femalewho presents with anxiety; insomnia.   Suicidal/Homicidal:Nowithout intent/plan  Therapist Response: Pt.continues to present as alert, talkative, energetic, engaged in the therapeutic process. Pt. Discussed that since our last session she had been very sick and had gallbladder surgery. Pt. Discussed that as a result she has had to take things very slow to recover from surgery, has only resumed regular schedule with her mother in the last week. Pt. Expressed to regret that she has not taken Christmas tree decorations down and not otherwise been able to be more active. Pt. Was encouraged to allow herself to continue to take it easy for a few more weeks and allow herself to heal. Significant time in session was spent discussing relationships with her partner Legrand [place]) who was not physically or emotionally present for her while she was in the hospital. Pt. Discussed that she was very hurt and angered by his absence. Counselor was encouraged to address the issue with him and to communicate her expectations should she need to be hospitalized in the future. Pt. Also discussed her concerns regarding her son's chronic lateness. Pt. Was very disappointed that her son was late for dinner to celebrate his 53st birthday which resulted in her leaving the restaurant and she was not able to celebrate with him. Session focused on the importance of accepting this aspect of her son's personality, which Pt. At this time feels compelled to change. Pt. Continues to make progress on her weight loss efforts, but expressed concern about dietary changes due to loss of her gallbladder; Pt. Was encouraged to discuss during next  consult with her dietician.   Plan: Return again in 2-3weeks. Continue with CBT based therapy.  Diagnosis:Generalized Anxiety Disorder  [name], [facility] 10/23/2017

## 2017-10-24 ENCOUNTER — Ambulatory Visit (INDEPENDENT_AMBULATORY_CARE_PROVIDER_SITE_OTHER): Payer: Medicare Other | Admitting: Physician Assistant

## 2017-10-24 VITALS — BP 115/77 | HR 70 | Temp 98.3°F | Ht 64.0 in | Wt 192.0 lb

## 2017-10-24 DIAGNOSIS — E669 Obesity, unspecified: Secondary | ICD-10-CM

## 2017-10-24 DIAGNOSIS — Z6833 Body mass index (BMI) 33.0-33.9, adult: Secondary | ICD-10-CM

## 2017-10-24 DIAGNOSIS — E559 Vitamin D deficiency, unspecified: Secondary | ICD-10-CM | POA: Diagnosis not present

## 2017-10-24 NOTE — Progress Notes (Signed)
Office: [phone]  /  Fax: [phone]   HPI:   Chief Complaint: OBESITY Sherry Strong is here to discuss her progress with her obesity treatment plan. She is following the Very Low Calorie Protein Sparing Modified Fast plan and is following her eating plan approximately 90 % of the time. She states she is not exercising.  Sherry Strong has maintained her weight. 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 She states on many occasions she gets busy and skips some of her meals.  Her weight is 192 lb (87.1 kg) today and she has maintained her weight since her last visit. She has lost 14 lbs since starting treatment with [name].  Vitamin D deficiency Sherry Strong has a diagnosis of vitamin D deficiency. She is currently taking vit D and denies nausea, vomiting or muscle weakness.   Ref. Range 07/12/2017 10:55  Vitamin D, 25-Hydroxy Latest Ref Range: 30.0 - 100.0 ng/mL 25.9 (L)    ALLERGIES: Allergies  Allergen Reactions  . Vistaril  [Hydroxyzine Hcl] Swelling    MEDICATIONS: Current Outpatient Medications on File Prior to Visit  Medication Sig Dispense Refill  . ALPRAZolam (XANAX) 0.5 MG tablet Take 0.5 mg by mouth 2 (two) times daily as needed. For anxiety    . aspirin EC 81 MG tablet Take 81 mg by mouth.    . dexlansoprazole (DEXILANT) 60 MG capsule Take 60 mg by mouth daily.    . fenofibrate (TRICOR) 145 MG tablet Take 145 mg by mouth daily.  1  . ibuprofen (ADVIL,MOTRIN) 800 MG tablet Take 800 mg by mouth every 8 (eight) hours as needed for pain.    Marland Kitchen lamoTRIgine (LAMICTAL) 200 MG tablet Take 1 tablet (200 mg total) by mouth daily. 90 tablet 0  . lurasidone (LATUDA) 80 MG TABS tablet Take 1 tablet (80 mg total) by mouth daily with breakfast. 90 tablet 0  . metoprolol succinate (TOPROL-XL) 25 MG 24 hr tablet Take 25 mg by mouth daily.     . pantoprazole (PROTONIX) 40 MG tablet Take 40 mg by mouth daily.    . pravastatin (PRAVACHOL) 40 MG tablet Take 40 mg by mouth every evening.    . vitamin C (ASCORBIC ACID)  500 MG tablet Take 500 mg by mouth daily.    . Vitamin D, Ergocalciferol, (DRISDOL) 50000 units CAPS capsule Take 1 capsule (50,000 Units total) by mouth every 7 (seven) days. 4 capsule 0   No current facility-administered medications on file prior to visit.     PAST MEDICAL HISTORY: Past Medical History:  Diagnosis Date  . Anxiety   . Bipolar disorder ([place])   . Complication of anesthesia   . Depression   . GERD (gastroesophageal reflux disease)   . Heart murmur    echo- 08/30/15-epic   . History of hiatal hernia   . HLD (hyperlipidemia)   . HTN (hypertension)   . Infertility, female   . Menopause   . PONV (postoperative nausea and vomiting)     PAST SURGICAL HISTORY: Past Surgical History:  Procedure Laterality Date  . CESAREAN SECTION    . CHOLECYSTECTOMY N/A 10/05/2017   Procedure: LAPAROSCOPIC CHOLECYSTECTOMY WITH INTRAOPERATIVE CHOLANGIOGRAM;  Surgeon: Sherry Overall, MD;  Location: [facility];  Service: General;  Laterality: N/A;  . LAPAROSCOPY      SOCIAL HISTORY: Social History   Tobacco Use  . Smoking status: Never Smoker  . Smokeless tobacco: Never Used  Substance Use Topics  . Alcohol use: Yes    Alcohol/week: 3.0 - 3.6 oz  Types: 5 - 6 Cans of beer per week    Comment: occas   . Drug use: No    FAMILY HISTORY: Family History  Problem Relation Age of Onset  . Heart disease Mother   . Depression Mother   . Hyperlipidemia Father   . Hypertension Father   . Heart disease Father   . Cancer Father   . Depression Father   . Anxiety disorder Father   . Sleep apnea Father     ROS: Review of Systems  Constitutional: Negative for weight loss.  Gastrointestinal: Negative for nausea and vomiting.  Musculoskeletal:       Negative for muscle weakness     PHYSICAL EXAM: Blood pressure 115/77, pulse 70, temperature 98.3 F (36.8 C), temperature source Oral, height 5\' 4"  (1.626 m), weight 192 lb (87.1 kg), SpO2 99 %. Body mass index is 32.96  kg/m. Physical Exam  Constitutional: She is oriented to person, place, and time. She appears well-developed and well-nourished.  HENT:  Head: Normocephalic.  Eyes: EOM are normal.  Neck: Normal range of motion.  Cardiovascular: Normal rate.  Pulmonary/Chest: Effort normal.  Musculoskeletal: Normal range of motion.  Neurological: She is alert and oriented to person, place, and time.  Skin: Skin is warm and dry.  Psychiatric: She has a normal mood and affect. Her behavior is normal.  Vitals reviewed.   RECENT LABS AND TESTS: BMET    Component Value Date/Time   NA 139 10/04/2017 1137   NA 142 07/12/2017 1055   K 4.1 10/04/2017 1137   CL 106 10/04/2017 1137   CO2 25 10/04/2017 1137   GLUCOSE 121 (H) 10/04/2017 1137   BUN 9 10/04/2017 1137   BUN 9 07/12/2017 1055   CREATININE 0.74 10/04/2017 1137   CALCIUM 9.3 10/04/2017 1137   GFRNONAA >60 10/04/2017 1137   GFRAA >60 10/04/2017 1137   Lab Results  Component Value Date   HGBA1C 5.4 07/12/2017   Lab Results  Component Value Date   INSULIN 20.6 07/12/2017   CBC    Component Value Date/Time   WBC 7.2 10/04/2017 1137   RBC 3.90 10/04/2017 1137   HGB 12.7 10/04/2017 1137   HGB 13.1 07/12/2017 1055   HCT 38.3 10/04/2017 1137   HCT 40.1 07/12/2017 1055   PLT 237 10/04/2017 1137   MCV 98.2 10/04/2017 1137   MCV 99 (H) 07/12/2017 1055   MCH 32.6 10/04/2017 1137   MCHC 33.2 10/04/2017 1137   RDW 13.1 10/04/2017 1137   RDW 13.1 07/12/2017 1055   LYMPHSABS 1.2 10/04/2017 1137   LYMPHSABS 1.5 07/12/2017 1055   MONOABS 0.6 10/04/2017 1137   EOSABS 0.1 10/04/2017 1137   EOSABS 0.1 07/12/2017 1055   BASOSABS 0.0 10/04/2017 1137   BASOSABS 0.0 07/12/2017 1055   Iron/TIBC/Ferritin/ %Sat No results found for: IRON, TIBC, FERRITIN, IRONPCTSAT Lipid Panel     Component Value Date/Time   CHOL 225 (H) 07/12/2017 1055   TRIG 105 07/12/2017 1055   HDL 53 07/12/2017 1055   LDLCALC 151 (H) 07/12/2017 1055   Hepatic  Function Panel     Component Value Date/Time   PROT 7.7 10/04/2017 1137   PROT 7.3 07/12/2017 1055   ALBUMIN 4.0 10/04/2017 1137   ALBUMIN 4.8 07/12/2017 1055   AST 63 (H) 10/04/2017 1137   ALT 77 (H) 10/04/2017 1137   ALKPHOS 111 10/04/2017 1137   BILITOT 0.8 10/04/2017 1137   BILITOT 0.2 07/12/2017 1055      Component Value  Date/Time   TSH 2.420 07/12/2017 1055     Ref. Range 07/12/2017 10:55  Vitamin D, 25-Hydroxy Latest Ref Range: 30.0 - 100.0 ng/mL 25.9 (L)   ASSESSMENT AND PLAN: Vitamin D deficiency  Class 1 obesity with serious comorbidity and body mass index (BMI) of 33.0 to 33.9 in adult, unspecified obesity type  PLAN: Vitamin D Deficiency Sherry Strong was informed that low vitamin D levels contributes to fatigue and are associated with obesity, breast, and colon cancer. She agrees to continue to take prescription Vit D @50 ,000 IU every week and will follow up for routine testing of vitamin D, at least 2-3 times per year. She was informed of the risk of over-replacement of vitamin D and agrees to not increase her dose unless she discusses this with [name] first.  We spent > than 50% of the 15 minute visit on the counseling as documented in the note.    Obesity Sherry Strong is currently in the action stage of change. As such, her goal is to continue with weight loss efforts She has agreed to follow the Category 2 plan +100 calories Sherry Strong has been instructed to work up to a goal of 150 minutes of combined cardio and strengthening exercise per week for weight loss and Strong health benefits. We discussed the following Behavioral Modification Strategies today: increasing lean protein intake and work on meal planning,easy cooking plans, and no skipping meals    Sherry Strong has agreed to follow up with our clinic in 2 weeks. She was informed of the importance of frequent follow up visits to maximize her success with intensive lifestyle modifications for her multiple health  conditions.    Today's visit was # 6 out of 22.  Starting weight: 206 lbs  Starting date: 07/12/17 Today's weight : 192 lbs Today's date: 10/24/2017 Total lbs lost to date: 14 (Patients must lose 7 lbs in the first 6 months to continue with counseling)   ASK: We discussed the diagnosis of obesity with Sherry Strong [id number] today and Sherry Strong agreed to give [name] permission to discuss obesity behavioral modification therapy today.  ASSESS: Sherry Strong has the diagnosis of obesity and her BMI today is 32.96 Sherry Strong is in the action stage of change   ADVISE: Sherry Strong was educated on the multiple health risks of obesity as well as the benefit of weight loss to improve her health. She was advised of the need for long term treatment and the importance of lifestyle modifications.  AGREE: Multiple dietary modification options and treatment options were discussed and  Sherry Strong agreed to the above obesity treatment plan.   Sherry Strong, am acting as transcriptionist for [employer], PA-C I, Sherry Strong [facility], have reviewed this note and agree with its content.

## 2017-11-07 ENCOUNTER — Ambulatory Visit (INDEPENDENT_AMBULATORY_CARE_PROVIDER_SITE_OTHER): Payer: Medicare Other | Admitting: Physician Assistant

## 2017-11-07 VITALS — BP 117/77 | HR 92 | Temp 98.1°F | Ht 64.0 in | Wt 192.0 lb

## 2017-11-07 DIAGNOSIS — E559 Vitamin D deficiency, unspecified: Secondary | ICD-10-CM | POA: Diagnosis not present

## 2017-11-07 DIAGNOSIS — Z6833 Body mass index (BMI) 33.0-33.9, adult: Secondary | ICD-10-CM | POA: Diagnosis not present

## 2017-11-07 DIAGNOSIS — E669 Obesity, unspecified: Secondary | ICD-10-CM | POA: Diagnosis not present
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
## 2017-11-10 ENCOUNTER — Other Ambulatory Visit (INDEPENDENT_AMBULATORY_CARE_PROVIDER_SITE_OTHER): Payer: Self-pay | Admitting: Family Medicine

## 2017-11-10 DIAGNOSIS — E559 Vitamin D deficiency, unspecified: Secondary | ICD-10-CM

## 2017-11-12 ENCOUNTER — Ambulatory Visit (HOSPITAL_COMMUNITY): Payer: Self-pay | Admitting: Psychiatry

## 2017-11-13 ENCOUNTER — Ambulatory Visit (INDEPENDENT_AMBULATORY_CARE_PROVIDER_SITE_OTHER): Payer: Medicare Other | Admitting: Psychiatry

## 2017-11-13 DIAGNOSIS — F3131 Bipolar disorder, current episode depressed, mild: Secondary | ICD-10-CM | POA: Diagnosis not present
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
## 2017-11-21 ENCOUNTER — Ambulatory Visit (INDEPENDENT_AMBULATORY_CARE_PROVIDER_SITE_OTHER): Payer: Medicare Other | Admitting: Physician Assistant

## 2017-11-21 VITALS — BP 118/85 | HR 82 | Temp 98.4°F | Ht 64.0 in | Wt 194.0 lb

## 2017-11-21 DIAGNOSIS — E559 Vitamin D deficiency, unspecified: Secondary | ICD-10-CM | POA: Diagnosis not present

## 2017-11-21 DIAGNOSIS — E669 Obesity, unspecified: Secondary | ICD-10-CM

## 2017-11-21 DIAGNOSIS — Z6833 Body mass index (BMI) 33.0-33.9, adult: Secondary | ICD-10-CM

## 2017-11-21 NOTE — Progress Notes (Signed)
Office: [phone]  /  Fax: [phone]   HPI:   Chief Complaint: OBESITY Sherry Strong is here to discuss her progress with her obesity treatment plan. She is on the Category 2 plan and is following her eating plan approximately 40 % of the time. She states she is exercising 0 minutes 0 times per week. [name] has had challenges following the meal plan and often skips her lunch meal. She states she feels the increase in hunger when she skips lunch.  Her weight is 194 lb (88 kg) today and has gained 2 pounds since her last visit. She has lost 12 lbs since starting treatment with [name].  Vitamin D Deficiency Sherry Strong has a diagnosis of vitamin D deficiency. She is currently taking prescription Vit D and denies nausea, vomiting or muscle weakness.  ALLERGIES: Allergies  Allergen Reactions  . Vistaril  [Hydroxyzine Hcl] Swelling    MEDICATIONS: Current Outpatient Medications on File Prior to Visit  Medication Sig Dispense Refill  . ALPRAZolam (XANAX) 0.5 MG tablet Take 0.5 mg by mouth 2 (two) times daily as needed. For anxiety    . aspirin EC 81 MG tablet Take 81 mg by mouth.    . dexlansoprazole (DEXILANT) 60 MG capsule Take 60 mg by mouth daily.    . fenofibrate (TRICOR) 145 MG tablet Take 145 mg by mouth daily.  1  . ibuprofen (ADVIL,MOTRIN) 800 MG tablet Take 800 mg by mouth every 8 (eight) hours as needed for pain.    Marland Kitchen lamoTRIgine (LAMICTAL) 200 MG tablet Take 1 tablet (200 mg total) by mouth daily. 90 tablet 0  . lurasidone (LATUDA) 80 MG TABS tablet Take 1 tablet (80 mg total) by mouth daily with breakfast. 90 tablet 0  . metoprolol succinate (TOPROL-XL) 25 MG 24 hr tablet Take 25 mg by mouth daily.     . pantoprazole (PROTONIX) 40 MG tablet Take 40 mg by mouth daily.    . pravastatin (PRAVACHOL) 40 MG tablet Take 40 mg by mouth every evening.    . vitamin C (ASCORBIC ACID) 500 MG tablet Take 500 mg by mouth daily.    . Vitamin D, Ergocalciferol, (DRISDOL) 50000 units CAPS capsule Take 1  capsule (50,000 Units total) by mouth every 7 (seven) days. 4 capsule 0   No current facility-administered medications on file prior to visit.     PAST MEDICAL HISTORY: Past Medical History:  Diagnosis Date  . Anxiety   . Bipolar disorder ([place])   . Complication of anesthesia   . Depression   . GERD (gastroesophageal reflux disease)   . Heart murmur    echo- 08/30/15-epic   . History of hiatal hernia   . HLD (hyperlipidemia)   . HTN (hypertension)   . Infertility, female   . Menopause   . PONV (postoperative nausea and vomiting)     PAST SURGICAL HISTORY: Past Surgical History:  Procedure Laterality Date  . CESAREAN SECTION    . CHOLECYSTECTOMY N/A 10/05/2017   Procedure: LAPAROSCOPIC CHOLECYSTECTOMY WITH INTRAOPERATIVE CHOLANGIOGRAM;  Surgeon: Alphonsa Overall, MD;  Location: [facility];  Service: General;  Laterality: N/A;  . LAPAROSCOPY      SOCIAL HISTORY: Social History   Tobacco Use  . Smoking status: Never Smoker  . Smokeless tobacco: Never Used  Substance Use Topics  . Alcohol use: Yes    Alcohol/week: 3.0 - 3.6 oz    Types: 5 - 6 Cans of beer per week    Comment: occas   . Drug use: No  FAMILY HISTORY: Family History  Problem Relation Age of Onset  . Heart disease Mother   . Depression Mother   . Hyperlipidemia Father   . Hypertension Father   . Heart disease Father   . Cancer Father   . Depression Father   . Anxiety disorder Father   . Sleep apnea Father     ROS: Review of Systems  Constitutional: Negative for weight loss.  Gastrointestinal: Negative for nausea and vomiting.  Musculoskeletal:       Negative muscle weakness    PHYSICAL EXAM: Blood pressure 118/85, pulse 82, temperature 98.4 F (36.9 C), temperature source Oral, height 5\' 4"  (1.626 m), weight 194 lb (88 kg), SpO2 98 %. Body mass index is 33.3 kg/m. Physical Exam  Constitutional: She is oriented to person, place, and time. She appears well-developed and well-nourished.    Cardiovascular: Normal rate.  Pulmonary/Chest: Effort normal.  Musculoskeletal: Normal range of motion.  Neurological: She is oriented to person, place, and time.  Skin: Skin is warm and dry.  Psychiatric: She has a normal mood and affect. Her behavior is normal.  Vitals reviewed.   RECENT LABS AND TESTS: BMET    Component Value Date/Time   NA 139 10/04/2017 1137   NA 142 07/12/2017 1055   K 4.1 10/04/2017 1137   CL 106 10/04/2017 1137   CO2 25 10/04/2017 1137   GLUCOSE 121 (H) 10/04/2017 1137   BUN 9 10/04/2017 1137   BUN 9 07/12/2017 1055   CREATININE 0.74 10/04/2017 1137   CALCIUM 9.3 10/04/2017 1137   GFRNONAA >60 10/04/2017 1137   GFRAA >60 10/04/2017 1137   Lab Results  Component Value Date   HGBA1C 5.4 07/12/2017   Lab Results  Component Value Date   INSULIN 20.6 07/12/2017   CBC    Component Value Date/Time   WBC 7.2 10/04/2017 1137   RBC 3.90 10/04/2017 1137   HGB 12.7 10/04/2017 1137   HGB 13.1 07/12/2017 1055   HCT 38.3 10/04/2017 1137   HCT 40.1 07/12/2017 1055   PLT 237 10/04/2017 1137   MCV 98.2 10/04/2017 1137   MCV 99 (H) 07/12/2017 1055   MCH 32.6 10/04/2017 1137   MCHC 33.2 10/04/2017 1137   RDW 13.1 10/04/2017 1137   RDW 13.1 07/12/2017 1055   LYMPHSABS 1.2 10/04/2017 1137   LYMPHSABS 1.5 07/12/2017 1055   MONOABS 0.6 10/04/2017 1137   EOSABS 0.1 10/04/2017 1137   EOSABS 0.1 07/12/2017 1055   BASOSABS 0.0 10/04/2017 1137   BASOSABS 0.0 07/12/2017 1055   Iron/TIBC/Ferritin/ %Sat No results found for: IRON, TIBC, FERRITIN, IRONPCTSAT Lipid Panel     Component Value Date/Time   CHOL 225 (H) 07/12/2017 1055   TRIG 105 07/12/2017 1055   HDL 53 07/12/2017 1055   LDLCALC 151 (H) 07/12/2017 1055   Hepatic Function Panel     Component Value Date/Time   PROT 7.7 10/04/2017 1137   PROT 7.3 07/12/2017 1055   ALBUMIN 4.0 10/04/2017 1137   ALBUMIN 4.8 07/12/2017 1055   AST 63 (H) 10/04/2017 1137   ALT 77 (H) 10/04/2017 1137   ALKPHOS  111 10/04/2017 1137   BILITOT 0.8 10/04/2017 1137   BILITOT 0.2 07/12/2017 1055      Component Value Date/Time   TSH 2.420 07/12/2017 1055  Results for Stitzer, DORENE BRUNI (MRN [id number]) as of 11/21/2017 16:33  Ref. Range 07/12/2017 10:55  Vitamin D, 25-Hydroxy Latest Ref Range: 30.0 - 100.0 ng/mL 25.9 (L)    ASSESSMENT AND  PLAN: Vitamin D deficiency  Class 1 obesity with serious comorbidity and body mass index (BMI) of 33.0 to 33.9 in adult, unspecified obesity type  PLAN:  Vitamin D Deficiency Londin was informed that low vitamin D levels contributes to fatigue and are associated with obesity, breast, and colon cancer. Debroh agrees to continue taking prescription Vit D @50 ,000 IU every week #4 and will follow up for routine testing of vitamin D, at least 2-3 times per year. She was informed of the risk of over-replacement of vitamin D and agrees to not increase her dose unless she discusses this with [name] first. Kiari agrees to follow up with our clinic in 2 weeks.  We spent > than 50% of the 15 minute visit on the counseling as documented in the note.  Obesity Ileana is currently in the action stage of change. As such, her goal is to continue with weight loss efforts She has agreed to follow the Category 2 plan Shaquasia has been instructed to work up to a goal of 150 minutes of combined cardio and strengthening exercise per week for weight loss and overall health benefits. We discussed the following Behavioral Modification Strategies today: increasing lean protein intake and no skipping meals   Eloyce has agreed to follow up with our clinic in 2 weeks. She was informed of the importance of frequent follow up visits to maximize her success with intensive lifestyle modifications for her multiple health conditions.   OBESITY BEHAVIORAL INTERVENTION VISIT  Today's visit was # 8 out of 22.  Starting weight: 206 lbs Starting date: 07/12/17 Today's weight : 194 lbs  Today's date: 11/21/2017 Total  lbs lost to date: 12 (Patients must lose 7 lbs in the first 6 months to continue with counseling)   ASK: We discussed the diagnosis of obesity with Arli Bree [id number] today and Zoriana agreed to give [name] permission to discuss obesity behavioral modification therapy today.  ASSESS: Catelynn has the diagnosis of obesity and her BMI today is 33.28 Arlenne is in the action stage of change   ADVISE: Fusae was educated on the multiple health risks of obesity as well as the benefit of weight loss to improve her health. She was advised of the need for long term treatment and the importance of lifestyle modifications.  AGREE: Multiple dietary modification options and treatment options were discussed and  Krizia agreed to the above obesity treatment plan.   Wilhemena Durie, am acting as transcriptionist for Lacy Duverney, PA-C I, Lacy Duverney [facility], have reviewed this note and agree with its content.

## 2017-11-24 ENCOUNTER — Other Ambulatory Visit (INDEPENDENT_AMBULATORY_CARE_PROVIDER_SITE_OTHER): Payer: Self-pay | Admitting: Family Medicine

## 2017-11-24 DIAGNOSIS — E559 Vitamin D deficiency, unspecified: Secondary | ICD-10-CM

## 2017-12-04 ENCOUNTER — Ambulatory Visit (INDEPENDENT_AMBULATORY_CARE_PROVIDER_SITE_OTHER): Payer: Medicare Other | Admitting: Psychiatry

## 2017-12-04 DIAGNOSIS — F3131 Bipolar disorder, current episode depressed, mild: Secondary | ICD-10-CM | POA: Diagnosis not present

## 2017-12-05 ENCOUNTER — Ambulatory Visit (INDEPENDENT_AMBULATORY_CARE_PROVIDER_SITE_OTHER): Payer: Medicare Other | Admitting: Physician Assistant

## 2017-12-05 VITALS — BP 110/74 | HR 78 | Ht 64.0 in | Wt 191.0 lb

## 2017-12-05 DIAGNOSIS — Z9189 Other specified personal risk factors, not elsewhere classified: Secondary | ICD-10-CM

## 2017-12-05 DIAGNOSIS — E7849 Other hyperlipidemia: Secondary | ICD-10-CM | POA: Diagnosis not present

## 2017-12-05 DIAGNOSIS — E669 Obesity, unspecified: Secondary | ICD-10-CM | POA: Diagnosis not present

## 2017-12-05 DIAGNOSIS — E559 Vitamin D deficiency, unspecified: Secondary | ICD-10-CM

## 2017-12-05 DIAGNOSIS — Z6832 Body mass index (BMI) 32.0-32.9, adult: Secondary | ICD-10-CM

## 2017-12-05 MED ORDER — VITAMIN D (ERGOCALCIFEROL) 1.25 MG (50000 UNIT) PO CAPS
50000.0000 [IU] | ORAL_CAPSULE | ORAL | 0 refills | Status: DC
Start: 2017-12-05 — End: 2018-01-02

## 2017-12-05 NOTE — Progress Notes (Signed)
Office: [phone]  /  Fax: [phone]   HPI:   Chief Complaint: OBESITY Sherry Strong is here to discuss her progress with her obesity treatment plan. She is on the Category 2 plan and is following her eating plan approximately 50 % of the time. She states she is exercising 0 minutes 0 times per week. Sherry Strong continues to do well with weight loss. She would like more variety with her meals, and more convenience meal options. Her weight is 191 lb (86.6 kg) today and has had a weight loss of 3 pounds over a period of 2 weeks since her last visit. She has lost 15 lbs since starting treatment with [name].   Vitamin D deficiency Sherry Strong has a diagnosis of vitamin D deficiency. She is currently taking vit D and denies nausea, vomiting or muscle weakness.   Ref. Range 07/12/2017 10:55  Vitamin D, 25-Hydroxy Latest Ref Range: 30.0 - 100.0 ng/mL 25.9 (L)   At risk for osteopenia and osteoporosis Sherry Strong is at higher risk of osteopenia and osteoporosis due to vitamin D deficiency.   Hyperlipidemia Sherry Strong has hyperlipidemia and is on Pravachol. She has been trying to improve her cholesterol levels with intensive lifestyle modification including a low saturated fat diet, exercise and weight loss. She denies any chest pain or dyspnea.  ALLERGIES: Allergies  Allergen Reactions  . Vistaril  [Hydroxyzine Hcl] Swelling    MEDICATIONS: Current Outpatient Medications on File Prior to Visit  Medication Sig Dispense Refill  . ALPRAZolam (XANAX) 0.5 MG tablet Take 0.5 mg by mouth 2 (two) times daily as needed. For anxiety    . aspirin EC 81 MG tablet Take 81 mg by mouth.    . dexlansoprazole (DEXILANT) 60 MG capsule Take 60 mg by mouth daily.    . fenofibrate (TRICOR) 145 MG tablet Take 145 mg by mouth daily.  1  . ibuprofen (ADVIL,MOTRIN) 800 MG tablet Take 800 mg by mouth every 8 (eight) hours as needed for pain.    Marland Kitchen lamoTRIgine (LAMICTAL) 200 MG tablet Take 1 tablet (200 mg total) by mouth daily. 90 tablet 0  .  lurasidone (LATUDA) 80 MG TABS tablet Take 1 tablet (80 mg total) by mouth daily with breakfast. 90 tablet 0  . metoprolol succinate (TOPROL-XL) 25 MG 24 hr tablet Take 25 mg by mouth daily.     . pantoprazole (PROTONIX) 40 MG tablet Take 40 mg by mouth daily.    . pravastatin (PRAVACHOL) 40 MG tablet Take 40 mg by mouth every evening.    . vitamin C (ASCORBIC ACID) 500 MG tablet Take 500 mg by mouth daily.    . Vitamin D, Ergocalciferol, (DRISDOL) 50000 units CAPS capsule Take 1 capsule (50,000 Units total) by mouth every 7 (seven) days. 4 capsule 0   No current facility-administered medications on file prior to visit.     PAST MEDICAL HISTORY: Past Medical History:  Diagnosis Date  . Anxiety   . Bipolar disorder ([place])   . Complication of anesthesia   . Depression   . GERD (gastroesophageal reflux disease)   . Heart murmur    echo- 08/30/15-epic   . History of hiatal hernia   . HLD (hyperlipidemia)   . HTN (hypertension)   . Infertility, female   . Menopause   . PONV (postoperative nausea and vomiting)     PAST SURGICAL HISTORY: Past Surgical History:  Procedure Laterality Date  . CESAREAN SECTION    . CHOLECYSTECTOMY N/A 10/05/2017   Procedure: LAPAROSCOPIC CHOLECYSTECTOMY WITH  INTRAOPERATIVE CHOLANGIOGRAM;  Surgeon: Alphonsa Overall, MD;  Location: [facility];  Service: General;  Laterality: N/A;  . LAPAROSCOPY      SOCIAL HISTORY: Social History   Tobacco Use  . Smoking status: Never Smoker  . Smokeless tobacco: Never Used  Substance Use Topics  . Alcohol use: Yes    Alcohol/week: 3.0 - 3.6 oz    Types: 5 - 6 Cans of beer per week    Comment: occas   . Drug use: No    FAMILY HISTORY: Family History  Problem Relation Age of Onset  . Heart disease Mother   . Depression Mother   . Hyperlipidemia Father   . Hypertension Father   . Heart disease Father   . Cancer Father   . Depression Father   . Anxiety disorder Father   . Sleep apnea Father     ROS: Review of  Systems  Constitutional: Positive for weight loss.  Respiratory: Negative for shortness of breath.   Cardiovascular: Negative for chest pain.  Gastrointestinal: Negative for nausea and vomiting.  Musculoskeletal:       Negative for muscle weakness    PHYSICAL EXAM: Blood pressure 110/74, pulse 78, height 5\' 4"  (1.626 m), weight 191 lb (86.6 kg), SpO2 98 %. Body mass index is 32.79 kg/m. Physical Exam  Constitutional: She is oriented to person, place, and time. She appears well-developed and well-nourished.  Cardiovascular: Normal rate.  Pulmonary/Chest: Effort normal.  Musculoskeletal: Normal range of motion.  Neurological: She is oriented to person, place, and time.  Skin: Skin is warm and dry.  Psychiatric: She has a normal mood and affect. Her behavior is normal.  Vitals reviewed.   RECENT LABS AND TESTS: BMET    Component Value Date/Time   NA 139 10/04/2017 1137   NA 142 07/12/2017 1055   K 4.1 10/04/2017 1137   CL 106 10/04/2017 1137   CO2 25 10/04/2017 1137   GLUCOSE 121 (H) 10/04/2017 1137   BUN 9 10/04/2017 1137   BUN 9 07/12/2017 1055   CREATININE 0.74 10/04/2017 1137   CALCIUM 9.3 10/04/2017 1137   GFRNONAA >60 10/04/2017 1137   GFRAA >60 10/04/2017 1137   Lab Results  Component Value Date   HGBA1C 5.4 07/12/2017   Lab Results  Component Value Date   INSULIN 20.6 07/12/2017   CBC    Component Value Date/Time   WBC 7.2 10/04/2017 1137   RBC 3.90 10/04/2017 1137   HGB 12.7 10/04/2017 1137   HGB 13.1 07/12/2017 1055   HCT 38.3 10/04/2017 1137   HCT 40.1 07/12/2017 1055   PLT 237 10/04/2017 1137   MCV 98.2 10/04/2017 1137   MCV 99 (H) 07/12/2017 1055   MCH 32.6 10/04/2017 1137   MCHC 33.2 10/04/2017 1137   RDW 13.1 10/04/2017 1137   RDW 13.1 07/12/2017 1055   LYMPHSABS 1.2 10/04/2017 1137   LYMPHSABS 1.5 07/12/2017 1055   MONOABS 0.6 10/04/2017 1137   EOSABS 0.1 10/04/2017 1137   EOSABS 0.1 07/12/2017 1055   BASOSABS 0.0 10/04/2017 1137    BASOSABS 0.0 07/12/2017 1055   Iron/TIBC/Ferritin/ %Sat No results found for: IRON, TIBC, FERRITIN, IRONPCTSAT Lipid Panel     Component Value Date/Time   CHOL 225 (H) 07/12/2017 1055   TRIG 105 07/12/2017 1055   HDL 53 07/12/2017 1055   LDLCALC 151 (H) 07/12/2017 1055   Hepatic Function Panel     Component Value Date/Time   PROT 7.7 10/04/2017 1137   PROT 7.3 07/12/2017  1055   ALBUMIN 4.0 10/04/2017 1137   ALBUMIN 4.8 07/12/2017 1055   AST 63 (H) 10/04/2017 1137   ALT 77 (H) 10/04/2017 1137   ALKPHOS 111 10/04/2017 1137   BILITOT 0.8 10/04/2017 1137   BILITOT 0.2 07/12/2017 1055      Component Value Date/Time   TSH 2.420 07/12/2017 1055    Ref. Range 07/12/2017 10:55  Vitamin D, 25-Hydroxy Latest Ref Range: 30.0 - 100.0 ng/mL 25.9 (L)   ASSESSMENT AND PLAN: Vitamin D deficiency - Plan: Vitamin D, Ergocalciferol, (DRISDOL) 50000 units CAPS capsule  Other hyperlipidemia  At risk for osteoporosis  Class 1 obesity with serious comorbidity and body mass index (BMI) of 32.0 to 32.9 in adult, unspecified obesity type  PLAN:  Vitamin D Deficiency Laysha was informed that low vitamin D levels contributes to fatigue and are associated with obesity, breast, and colon cancer. She agrees to continue to take prescription Vit D @50 ,000 IU every week #4 with no refills and will follow up for routine testing of vitamin D, at least 2-3 times per year. She was informed of the risk of over-replacement of vitamin D and agrees to not increase her dose unless she discusses this with [name] first. Elfrieda agrees to follow up with our clinic in 2 weeks.  At risk for osteopenia and osteoporosis Yashica is at risk for osteopenia and osteoporosis due to her vitamin D deficiency. She was encouraged to take her vitamin D and follow her higher calcium diet and increase strengthening exercise to help strengthen her bones and decrease her risk of osteopenia and osteoporosis.  Hyperlipidemia Ana was informed  of the American Heart Association Guidelines emphasizing intensive lifestyle modifications as the first line treatment for hyperlipidemia. We discussed many lifestyle modifications today in depth, and Layali will continue to work on decreasing saturated fats such as fatty red meat, butter and many fried foods. She will also increase vegetables and lean protein in her diet and continue to work on exercise and weight loss efforts. She will continue her medications as prescribed and follow up as directed.  Obesity Delrae is currently in the action stage of change. As such, her goal is to continue with weight loss efforts She has agreed to keep a food journal with 1200 calories and 85 grams of protein daily Loreen has been instructed to work up to a goal of 150 minutes of combined cardio and strengthening exercise per week for weight loss and overall health benefits. We discussed the following Behavioral Modification Strategies today: increasing lean protein intake and keep a strict food journal  Fanta has agreed to follow up with our clinic in 2 weeks. She was informed of the importance of frequent follow up visits to maximize her success with intensive lifestyle modifications for her multiple health conditions.    OBESITY BEHAVIORAL INTERVENTION VISIT  Today's visit was # 9 out of 22.  Starting weight: 206 lbs Starting date: 07/12/17 Today's weight : 191 lbs Today's date: 12/05/2017 Total lbs lost to date: 15 (Patients must lose 7 lbs in the first 6 months to continue with counseling)   ASK: We discussed the diagnosis of obesity with Deshia Vanderhoof JASNKNL today and Nyja agreed to give [name] permission to discuss obesity behavioral modification therapy today.  ASSESS: Quadasia has the diagnosis of obesity and her BMI today is 32.77 Valborg is in the action stage of change   ADVISE: Kathyjo was educated on the multiple health risks of obesity as well as the benefit of  weight loss to improve her health. She was  advised of the need for long term treatment and the importance of lifestyle modifications.  AGREE: Multiple dietary modification options and treatment options were discussed and  Niara agreed to the above obesity treatment plan.   Corey Skains, am acting as transcriptionist for [employer], PA-C I, Lacy Duverney [employer], have reviewed this note and agree with its content

## 2017-12-06 NOTE — Progress Notes (Signed)
   THERAPIST PROGRESS NOTE  Session Time:3:05-4:05  Participation Level:Active  Behavioral Response:CasualAlertActiveEuthymic  Type of Therapy: Individual Therapy  Treatment Goals addressed: Anxiety  Interventions:Strength-based and Supportive  Summary: Sherry Strong [id number] a 56y.o.femalewho presents with anxiety; insomnia.   Suicidal/Homicidal:Nowithout intent/plan  Therapist Response: Pt.continues to presentas alert, talkative, energetic, engaged in the therapeutic process. Pt. Discussed her concerns regarding transition to new psychiatrist. Pt. Discussed her problems with her significant other who continues to be a barrier to her weight loss efforts. Pt. Has decided to stay in the relationship, and wants to work on strategies for minimizing her stress in the relationship. Pt. Discussed that her significant other continues to insist on doing the grocery shopping, but does not purchase items that she can eat and makes it difficult or her to plan healthy meals. Counselor recommended discussing with him taking the role of grocery shopping so that she can have more control over the types of foods that come into the house. Pt. Discussed that he would likely criticize her ability to shop and challenge her to cook more. Significant time in session was spent processing her responses to his criticism. Pt. Reported that she was hopeful that she would be able to talk to him and begin process of shifting the roles in the relationship. Time was also spent processing recent challenges with maintaining behavior changes needed for her weight loss.  Plan: Return again in 2-3weeks. Continue with CBT based therapy.  Diagnosis:Generalized Anxiety Disorder   [name], [facility] 12/06/2017

## 2017-12-11 ENCOUNTER — Telehealth (HOSPITAL_COMMUNITY): Payer: Self-pay

## 2017-12-11 NOTE — Telephone Encounter (Signed)
Medication management - Telephone call with patient to discuss her reported desire to change psychiatrists as pt stated concerns she continues to suffer from a lot of symptoms of depression but does not feel she is being heard.  Patient reported frustration that when she comes in for evaluations, she does not understand why Dr. Adele Schilder insists on asking her questions related to symptoms of mania, such as has she done any impulsive shopping or racing thoughts when she reports this has not occurred since she was in her 75's.  Patient reported she originally felt some improvement in depressive symptoms when she first started [place] but reports now this is not working as well and is tired of always feeling down and feels another provider may have some other options to offer.  Patient reported she does take Lamictal, which she reports she has been on for a long time, and that this keeps her mood from becoming elevated which at this point she reports she would not mind sometimes to feel.  Discussed with patient that due to her history and diagnosis of Bipolar affective disorder, and medication regimen, it is most likely Dr. Adele Schilder wants to be very careful about making big medication changes that may affect her mood negatively.   Informed with her diagnosis, that a lot of the questions Dr. Adele Schilder asks her when she comes in for evaluations are standard type questions any psychiatrist would most likely ask to assess symptoms of mania if someone had that history.  Patient stated understanding this but just felt she was having difficulty connecting to Dr. Adele Schilder and would like to explore options for a new provider.  Patient agreed to come in for scheduled evaluation on 12/13/17 and for this clinical manager to meet with her and Dr. Adele Schilder to discuss her concerns and to then come to a resolution of the best fit for services.  Informed patient this may mean he would agree to support her change, he may not but if he did not then we  could help with alternative provider referrals for others in our community and patient stated this would be fine if meeting was not helpful.  Dr. Adele Schilder agreed to meeting earlier this date when patient concerns expressed to him by this nurse manager and will see patient with nurse manager on 12/13/17.

## 2017-12-13 ENCOUNTER — Encounter (HOSPITAL_COMMUNITY): Payer: Self-pay | Admitting: Psychiatry

## 2017-12-13 ENCOUNTER — Ambulatory Visit (INDEPENDENT_AMBULATORY_CARE_PROVIDER_SITE_OTHER): Payer: Medicare Other | Admitting: Psychiatry

## 2017-12-13 VITALS — BP 122/84 | HR 88 | Ht 64.0 in | Wt 196.0 lb

## 2017-12-13 DIAGNOSIS — R450 Nervousness: Secondary | ICD-10-CM

## 2017-12-13 DIAGNOSIS — F3131 Bipolar disorder, current episode depressed, mild: Secondary | ICD-10-CM | POA: Diagnosis not present

## 2017-12-13 DIAGNOSIS — F1099 Alcohol use, unspecified with unspecified alcohol-induced disorder: Secondary | ICD-10-CM

## 2017-12-13 DIAGNOSIS — Z818 Family history of other mental and behavioral disorders: Secondary | ICD-10-CM

## 2017-12-13 DIAGNOSIS — F419 Anxiety disorder, unspecified: Secondary | ICD-10-CM | POA: Diagnosis not present

## 2017-12-13 MED ORDER — VORTIOXETINE HBR 10 MG PO TABS: ORAL_TABLET | ORAL | 0 refills | Status: DC

## 2017-12-13 MED ORDER — LURASIDONE HCL 80 MG PO TABS: 80.0000 mg | ORAL_TABLET | Freq: Every day | ORAL | 0 refills | Status: DC

## 2017-12-13 MED ORDER — LAMOTRIGINE 200 MG PO TABS: 200.0000 mg | ORAL_TABLET | Freq: Every day | ORAL | 0 refills | Status: DC
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
## 2017-12-18 ENCOUNTER — Ambulatory Visit (HOSPITAL_COMMUNITY): Payer: Medicare Other | Admitting: Psychiatry

## 2017-12-19 ENCOUNTER — Ambulatory Visit (INDEPENDENT_AMBULATORY_CARE_PROVIDER_SITE_OTHER): Payer: Medicare Other | Admitting: Physician Assistant

## 2017-12-19 VITALS — BP 108/75 | HR 70 | Temp 98.4°F | Ht 64.0 in | Wt 192.0 lb

## 2017-12-19 DIAGNOSIS — Z6833 Body mass index (BMI) 33.0-33.9, adult: Secondary | ICD-10-CM

## 2017-12-19 DIAGNOSIS — E669 Obesity, unspecified: Secondary | ICD-10-CM

## 2017-12-19 DIAGNOSIS — E559 Vitamin D deficiency, unspecified: Secondary | ICD-10-CM

## 2017-12-19 NOTE — Progress Notes (Signed)
Office: [phone]  /  Fax: [phone]   HPI:   Chief Complaint: OBESITY Sherry Strong is here to discuss her progress with her obesity treatment plan. She is on the  keep a food journal with 1200 calories and 85 grams of protein  and is following her eating plan approximately 85 % of the time. She states she is exercising 0 minutes 0 times per week. Sherry Strong continues to make mindful food choices, however, she has not been keeping a food journal and she is not keeping up with her protein intake. Her weight is 192 lb (87.1 kg) today and has had a weight gain of 1 pound over a period of 2 weeks since her last visit. She has lost 14 lbs since starting treatment with [name].  Vitamin D deficiency Sherry Strong has a diagnosis of vitamin D deficiency. She is currently taking vit D and denies nausea, vomiting or muscle weakness.  ALLERGIES: Allergies  Allergen Reactions  . Vistaril  [Hydroxyzine Hcl] Swelling    MEDICATIONS: Current Outpatient Medications on File Prior to Visit  Medication Sig Dispense Refill  . ALPRAZolam (XANAX) 0.5 MG tablet Take 0.5 mg by mouth 2 (two) times daily as needed. For anxiety    . aspirin EC 81 MG tablet Take 81 mg by mouth.    . dexlansoprazole (DEXILANT) 60 MG capsule Take 60 mg by mouth daily.    . fenofibrate (TRICOR) 145 MG tablet Take 145 mg by mouth daily.  1  . ibuprofen (ADVIL,MOTRIN) 800 MG tablet Take 800 mg by mouth every 8 (eight) hours as needed for pain.    Marland Kitchen lamoTRIgine (LAMICTAL) 200 MG tablet Take 1 tablet (200 mg total) by mouth daily. 90 tablet 0  . lurasidone (LATUDA) 80 MG TABS tablet Take 1 tablet (80 mg total) by mouth daily with breakfast. 90 tablet 0  . metoprolol succinate (TOPROL-XL) 25 MG 24 hr tablet Take 25 mg by mouth daily.     . pantoprazole (PROTONIX) 40 MG tablet Take 40 mg by mouth daily.    . pravastatin (PRAVACHOL) 40 MG tablet Take 40 mg by mouth every evening.    . vitamin C (ASCORBIC ACID) 500 MG tablet Take 500 mg by mouth daily.      . Vitamin D, Ergocalciferol, (DRISDOL) 50000 units CAPS capsule Take 1 capsule (50,000 Units total) by mouth every 7 (seven) days. 4 capsule 0  . vortioxetine HBr (TRINTELLIX) 10 MG TABS tablet Take one tab daily for 1 week and than 20 mg daily 32 tablet 0   No current facility-administered medications on file prior to visit.     PAST MEDICAL HISTORY: Past Medical History:  Diagnosis Date  . Anxiety   . Bipolar disorder ([place])   . Complication of anesthesia   . Depression   . GERD (gastroesophageal reflux disease)   . Heart murmur    echo- 08/30/15-epic   . History of hiatal hernia   . HLD (hyperlipidemia)   . HTN (hypertension)   . Infertility, female   . Menopause   . PONV (postoperative nausea and vomiting)     PAST SURGICAL HISTORY: Past Surgical History:  Procedure Laterality Date  . CESAREAN SECTION    . CHOLECYSTECTOMY N/A 10/05/2017   Procedure: LAPAROSCOPIC CHOLECYSTECTOMY WITH INTRAOPERATIVE CHOLANGIOGRAM;  Surgeon: Alphonsa Overall, MD;  Location: [facility];  Service: General;  Laterality: N/A;  . LAPAROSCOPY      SOCIAL HISTORY: Social History   Tobacco Use  . Smoking status: Never Smoker  .  Smokeless tobacco: Never Used  Substance Use Topics  . Alcohol use: Yes    Alcohol/week: 3.0 - 3.6 oz    Types: 5 - 6 Cans of beer per week    Comment: occas   . Drug use: No    FAMILY HISTORY: Family History  Problem Relation Age of Onset  . Heart disease Mother   . Depression Mother   . Hyperlipidemia Father   . Hypertension Father   . Heart disease Father   . Cancer Father   . Depression Father   . Anxiety disorder Father   . Sleep apnea Father     ROS: Review of Systems  Constitutional: Negative for weight loss.  Gastrointestinal: Negative for nausea and vomiting.  Musculoskeletal:       Negative for muscle weakness    PHYSICAL EXAM: Blood pressure 108/75, pulse 70, temperature 98.4 F (36.9 C), temperature source Oral, height 5\' 4"  (1.626 m), weight  192 lb (87.1 kg), SpO2 98 %. Body mass index is 32.96 kg/m. Physical Exam  Constitutional: She is oriented to person, place, and time. She appears well-developed and well-nourished.  Cardiovascular: Normal rate.  Pulmonary/Chest: Effort normal.  Musculoskeletal: Normal range of motion.  Neurological: She is oriented to person, place, and time.  Skin: Skin is warm and dry.  Psychiatric: She has a normal mood and affect. Her behavior is normal.  Vitals reviewed.   RECENT LABS AND TESTS: BMET    Component Value Date/Time   NA 139 10/04/2017 1137   NA 142 07/12/2017 1055   K 4.1 10/04/2017 1137   CL 106 10/04/2017 1137   CO2 25 10/04/2017 1137   GLUCOSE 121 (H) 10/04/2017 1137   BUN 9 10/04/2017 1137   BUN 9 07/12/2017 1055   CREATININE 0.74 10/04/2017 1137   CALCIUM 9.3 10/04/2017 1137   GFRNONAA >60 10/04/2017 1137   GFRAA >60 10/04/2017 1137   Lab Results  Component Value Date   HGBA1C 5.4 07/12/2017   Lab Results  Component Value Date   INSULIN 20.6 07/12/2017   CBC    Component Value Date/Time   WBC 7.2 10/04/2017 1137   RBC 3.90 10/04/2017 1137   HGB 12.7 10/04/2017 1137   HGB 13.1 07/12/2017 1055   HCT 38.3 10/04/2017 1137   HCT 40.1 07/12/2017 1055   PLT 237 10/04/2017 1137   MCV 98.2 10/04/2017 1137   MCV 99 (H) 07/12/2017 1055   MCH 32.6 10/04/2017 1137   MCHC 33.2 10/04/2017 1137   RDW 13.1 10/04/2017 1137   RDW 13.1 07/12/2017 1055   LYMPHSABS 1.2 10/04/2017 1137   LYMPHSABS 1.5 07/12/2017 1055   MONOABS 0.6 10/04/2017 1137   EOSABS 0.1 10/04/2017 1137   EOSABS 0.1 07/12/2017 1055   BASOSABS 0.0 10/04/2017 1137   BASOSABS 0.0 07/12/2017 1055   Iron/TIBC/Ferritin/ %Sat No results found for: IRON, TIBC, FERRITIN, IRONPCTSAT Lipid Panel     Component Value Date/Time   CHOL 225 (H) 07/12/2017 1055   TRIG 105 07/12/2017 1055   HDL 53 07/12/2017 1055   LDLCALC 151 (H) 07/12/2017 1055   Hepatic Function Panel     Component Value Date/Time    PROT 7.7 10/04/2017 1137   PROT 7.3 07/12/2017 1055   ALBUMIN 4.0 10/04/2017 1137   ALBUMIN 4.8 07/12/2017 1055   AST 63 (H) 10/04/2017 1137   ALT 77 (H) 10/04/2017 1137   ALKPHOS 111 10/04/2017 1137   BILITOT 0.8 10/04/2017 1137   BILITOT 0.2 07/12/2017 1055  Component Value Date/Time   TSH 2.420 07/12/2017 1055   Results for Kempen, VICKY SCHLEICH (MRN [id number]) as of 12/19/2017 15:59  Ref. Range 07/12/2017 10:55  Vitamin D, 25-Hydroxy Latest Ref Range: 30.0 - 100.0 ng/mL 25.9 (L)   ASSESSMENT AND PLAN: Vitamin D deficiency  Class 1 obesity with serious comorbidity and body mass index (BMI) of 33.0 to 33.9 in adult, unspecified obesity type  PLAN:  Vitamin D Deficiency Merridith was informed that low vitamin D levels contributes to fatigue and are associated with obesity, breast, and colon cancer. She agrees to continue to take prescription Vit D @50 ,000 IU every week and will follow up for routine testing of vitamin D, at least 2-3 times per year. She was informed of the risk of over-replacement of vitamin D and agrees to not increase her dose unless she discusses this with [name] first.  We spent > than 50% of the 15 minute visit on the counseling as documented in the note.  Obesity Shantal is currently in the action stage of change. As such, her goal is to continue with weight loss efforts She has agreed to keep a food journal with 1200 calories and 85 grams of protein daily Skylyn has been instructed to work up to a goal of 150 minutes of combined cardio and strengthening exercise per week for weight loss and overall health benefits. We discussed the following Behavioral Modification Strategies today: increasing lean protein intake and keep a strict food journal  Beckie has agreed to follow up with our clinic in 2 weeks. She was informed of the importance of frequent follow up visits to maximize her success with intensive lifestyle modifications for her multiple health  conditions.    OBESITY BEHAVIORAL INTERVENTION VISIT  Today's visit was # 10 out of 22.  Starting weight: 206 lbs Starting date: 07/12/17 Today's weight : 192 lbs Today's date: 12/19/2017 Total lbs lost to date: 14  (Patients must lose 7 lbs in the first 6 months to continue with counseling)   ASK: We discussed the diagnosis of obesity with Braylon Lemmons [id number] today and Shawnay agreed to give [name] permission to discuss obesity behavioral modification therapy today.  ASSESS: Shaleta has the diagnosis of obesity and her BMI today is 32.94 Aleeyah is in the action stage of change   ADVISE: Shaelin was educated on the multiple health risks of obesity as well as the benefit of weight loss to improve her health. She was advised of the need for long term treatment and the importance of lifestyle modifications.  AGREE: Multiple dietary modification options and treatment options were discussed and  Garry agreed to the above obesity treatment plan.   Corey Skains, am acting as transcriptionist for [employer], PA-C I, Lacy Duverney [facility], have reviewed this note and agree with its content

## 2018-01-02 ENCOUNTER — Ambulatory Visit (INDEPENDENT_AMBULATORY_CARE_PROVIDER_SITE_OTHER): Payer: Medicare Other | Admitting: Physician Assistant

## 2018-01-02 VITALS — BP 110/73 | HR 68 | Temp 98.1°F | Ht 64.0 in | Wt 192.0 lb

## 2018-01-02 DIAGNOSIS — Z9189 Other specified personal risk factors, not elsewhere classified: Secondary | ICD-10-CM

## 2018-01-02 DIAGNOSIS — Z6833 Body mass index (BMI) 33.0-33.9, adult: Secondary | ICD-10-CM | POA: Diagnosis not present

## 2018-01-02 DIAGNOSIS — E669 Obesity, unspecified: Secondary | ICD-10-CM

## 2018-01-02 DIAGNOSIS — E7849 Other hyperlipidemia: Secondary | ICD-10-CM | POA: Diagnosis not present

## 2018-01-02 DIAGNOSIS — E559 Vitamin D deficiency, unspecified: Secondary | ICD-10-CM | POA: Diagnosis not present

## 2018-01-02 MED ORDER — VITAMIN D (ERGOCALCIFEROL) 1.25 MG (50000 UNIT) PO CAPS: 50000.0000 [IU] | ORAL_CAPSULE | ORAL | 0 refills | Status: DC

## 2018-01-02 NOTE — Progress Notes (Signed)
Office: [phone]  /  Fax: [phone]   HPI:   Chief Complaint: OBESITY Sherry Strong is here to discuss her progress with her obesity treatment plan. She is on the Category 2 plan and is following her eating plan approximately 90 % of the time. She states she is walking for 20 minutes 4 times per week. Sherry Strong states she is following the meal plan closely and cooking at home. She would like more meal planning ideas.  Her weight is 192 lb (87.1 kg) today and has not lost weight since her last visit. She has lost 14 lbs since starting treatment with [name].  Vitamin D Deficiency Sherry Strong has a diagnosis of vitamin D deficiency. She is currently taking prescription Vit D and denies nausea, vomiting or muscle weakness.  At risk for osteopenia and osteoporosis Sherry Strong is at higher risk of osteopenia and osteoporosis due to vitamin D deficiency.   Hyperlipidemia Sherry Strong has hyperlipidemia and has been trying to improve her cholesterol levels with intensive lifestyle modification including a low saturated fat diet, exercise and weight loss. She is on Pravachol and fenofibrate. She denies any chest pain, claudication or myalgias.  ALLERGIES: Allergies  Allergen Reactions  . Vistaril  [Hydroxyzine Hcl] Swelling    MEDICATIONS: Current Outpatient Medications on File Prior to Visit  Medication Sig Dispense Refill  . ALPRAZolam (XANAX) 0.5 MG tablet Take 0.5 mg by mouth 2 (two) times daily as needed. For anxiety    . aspirin EC 81 MG tablet Take 81 mg by mouth.    . dexlansoprazole (DEXILANT) 60 MG capsule Take 60 mg by mouth daily.    . fenofibrate (TRICOR) 145 MG tablet Take 145 mg by mouth daily.  1  . ibuprofen (ADVIL,MOTRIN) 800 MG tablet Take 800 mg by mouth every 8 (eight) hours as needed for pain.    Marland Kitchen lamoTRIgine (LAMICTAL) 200 MG tablet Take 1 tablet (200 mg total) by mouth daily. 90 tablet 0  . lurasidone (LATUDA) 80 MG TABS tablet Take 1 tablet (80 mg total) by mouth daily with breakfast. 90 tablet  0  . metoprolol succinate (TOPROL-XL) 25 MG 24 hr tablet Take 25 mg by mouth daily.     . pantoprazole (PROTONIX) 40 MG tablet Take 40 mg by mouth daily.    . pravastatin (PRAVACHOL) 40 MG tablet Take 40 mg by mouth every evening.    . vitamin C (ASCORBIC ACID) 500 MG tablet Take 500 mg by mouth daily.    . Vitamin D, Ergocalciferol, (DRISDOL) 50000 units CAPS capsule Take 1 capsule (50,000 Units total) by mouth every 7 (seven) days. 4 capsule 0  . vortioxetine HBr (TRINTELLIX) 10 MG TABS tablet Take one tab daily for 1 week and than 20 mg daily 32 tablet 0   No current facility-administered medications on file prior to visit.     PAST MEDICAL HISTORY: Past Medical History:  Diagnosis Date  . Anxiety   . Bipolar disorder ([place])   . Complication of anesthesia   . Depression   . GERD (gastroesophageal reflux disease)   . Heart murmur    echo- 08/30/15-epic   . History of hiatal hernia   . HLD (hyperlipidemia)   . HTN (hypertension)   . Infertility, female   . Menopause   . PONV (postoperative nausea and vomiting)     PAST SURGICAL HISTORY: Past Surgical History:  Procedure Laterality Date  . CESAREAN SECTION    . CHOLECYSTECTOMY N/A 10/05/2017   Procedure: LAPAROSCOPIC CHOLECYSTECTOMY WITH INTRAOPERATIVE CHOLANGIOGRAM;  Surgeon: Alphonsa Overall, MD;  Location: [facility];  Service: General;  Laterality: N/A;  . LAPAROSCOPY      SOCIAL HISTORY: Social History   Tobacco Use  . Smoking status: Never Smoker  . Smokeless tobacco: Never Used  Substance Use Topics  . Alcohol use: Yes    Alcohol/week: 3.0 - 3.6 oz    Types: 5 - 6 Cans of beer per week    Comment: occas   . Drug use: No    FAMILY HISTORY: Family History  Problem Relation Age of Onset  . Heart disease Mother   . Depression Mother   . Hyperlipidemia Father   . Hypertension Father   . Heart disease Father   . Cancer Father   . Depression Father   . Anxiety disorder Father   . Sleep apnea Father      ROS: Review of Systems  Constitutional: Negative for weight loss.  Cardiovascular: Negative for chest pain and claudication.  Gastrointestinal: Negative for nausea and vomiting.  Musculoskeletal: Negative for myalgias.       Negative muscle weakness    PHYSICAL EXAM: Blood pressure 110/73, pulse 68, temperature 98.1 F (36.7 C), temperature source Oral, height 5\' 4"  (1.626 m), weight 192 lb (87.1 kg), SpO2 97 %. Body mass index is 32.96 kg/m. Physical Exam  Constitutional: She is oriented to person, place, and time. She appears well-developed and well-nourished.  Cardiovascular: Normal rate.  Pulmonary/Chest: Effort normal.  Musculoskeletal: Normal range of motion.  Neurological: She is oriented to person, place, and time.  Skin: Skin is warm and dry.  Psychiatric: She has a normal mood and affect. Her behavior is normal.  Vitals reviewed.   RECENT LABS AND TESTS: BMET    Component Value Date/Time   NA 139 10/04/2017 1137   NA 142 07/12/2017 1055   K 4.1 10/04/2017 1137   CL 106 10/04/2017 1137   CO2 25 10/04/2017 1137   GLUCOSE 121 (H) 10/04/2017 1137   BUN 9 10/04/2017 1137   BUN 9 07/12/2017 1055   CREATININE 0.74 10/04/2017 1137   CALCIUM 9.3 10/04/2017 1137   GFRNONAA >60 10/04/2017 1137   GFRAA >60 10/04/2017 1137   Lab Results  Component Value Date   HGBA1C 5.4 07/12/2017   Lab Results  Component Value Date   INSULIN 20.6 07/12/2017   CBC    Component Value Date/Time   WBC 7.2 10/04/2017 1137   RBC 3.90 10/04/2017 1137   HGB 12.7 10/04/2017 1137   HGB 13.1 07/12/2017 1055   HCT 38.3 10/04/2017 1137   HCT 40.1 07/12/2017 1055   PLT 237 10/04/2017 1137   MCV 98.2 10/04/2017 1137   MCV 99 (H) 07/12/2017 1055   MCH 32.6 10/04/2017 1137   MCHC 33.2 10/04/2017 1137   RDW 13.1 10/04/2017 1137   RDW 13.1 07/12/2017 1055   LYMPHSABS 1.2 10/04/2017 1137   LYMPHSABS 1.5 07/12/2017 1055   MONOABS 0.6 10/04/2017 1137   EOSABS 0.1 10/04/2017 1137    EOSABS 0.1 07/12/2017 1055   BASOSABS 0.0 10/04/2017 1137   BASOSABS 0.0 07/12/2017 1055   Iron/TIBC/Ferritin/ %Sat No results found for: IRON, TIBC, FERRITIN, IRONPCTSAT Lipid Panel     Component Value Date/Time   CHOL 225 (H) 07/12/2017 1055   TRIG 105 07/12/2017 1055   HDL 53 07/12/2017 1055   LDLCALC 151 (H) 07/12/2017 1055   Hepatic Function Panel     Component Value Date/Time   PROT 7.7 10/04/2017 1137   PROT 7.3  07/12/2017 1055   ALBUMIN 4.0 10/04/2017 1137   ALBUMIN 4.8 07/12/2017 1055   AST 63 (H) 10/04/2017 1137   ALT 77 (H) 10/04/2017 1137   ALKPHOS 111 10/04/2017 1137   BILITOT 0.8 10/04/2017 1137   BILITOT 0.2 07/12/2017 1055      Component Value Date/Time   TSH 2.420 07/12/2017 1055  Results for Imler, DIJON KOHLMAN (MRN [id number]) as of 01/02/2018 17:43  Ref. Range 07/12/2017 10:55  Vitamin D, 25-Hydroxy Latest Ref Range: 30.0 - 100.0 ng/mL 25.9 (L)    ASSESSMENT AND PLAN: Vitamin D deficiency - Plan: Vitamin D, Ergocalciferol, (DRISDOL) 50000 units CAPS capsule  Other hyperlipidemia  At risk for osteoporosis  Class 1 obesity with serious comorbidity and body mass index (BMI) of 33.0 to 33.9 in adult, unspecified obesity type  PLAN:  Vitamin D Deficiency Sherry Strong was informed that low vitamin D levels contributes to fatigue and are associated with obesity, breast, and colon cancer. Sherry Strong agrees to continue taking prescription Vit D @50 ,000 IU every week #4 and we will refill for 1 month. She will follow up for routine testing of vitamin D, at least 2-3 times per year. She was informed of the risk of over-replacement of vitamin D and agrees to not increase her dose unless she discusses this with [name] first. Sherry Strong agrees to follow up with our clinic in 2 weeks.  At risk for osteopenia and osteoporosis Sherry Strong is at risk for osteopenia and osteoporsis due to her vitamin D deficiency. She was encouraged to take her vitamin D and follow her higher calcium diet and increase  strengthening exercise to help strengthen her bones and decrease her risk of osteopenia and osteoporosis.  Hyperlipidemia Sherry Strong was informed of the American Heart Association Guidelines emphasizing intensive lifestyle modifications as the first line treatment for hyperlipidemia. We discussed many lifestyle modifications today in depth, and Sherry Strong will continue to work on decreasing saturated fats such as fatty red meat, butter and many fried foods. She will also increase vegetables and lean protein in her diet and continue to work on exercise and weight loss efforts. Sherry Strong agrees to continue her medications and she agrees to follow up with our clinic in 2 weeks.  Obesity Sherry Strong is currently in the action stage of change. As such, her goal is to continue with weight loss efforts She has agreed to follow the Category 2 plan Sherry Strong has been instructed to work up to a goal of 150 minutes of combined cardio and strengthening exercise per week for weight loss and overall health benefits. We discussed the following Behavioral Modification Strategies today: increasing lean protein intake and work on meal planning and easy cooking plans   Sherry Strong has agreed to follow up with our clinic in 2 weeks. She was informed of the importance of frequent follow up visits to maximize her success with intensive lifestyle modifications for her multiple health conditions.   OBESITY BEHAVIORAL INTERVENTION VISIT  Today's visit was # 11 out of 22.  Starting weight: 206 lbs Starting date: 07/12/17 Today's weight : 192 lbs  Today's date: 01/02/2018 Total lbs lost to date: 14 (Patients must lose 7 lbs in the first 6 months to continue with counseling)   ASK: We discussed the diagnosis of obesity with Sherry Strong [id number] today and Sherry Strong agreed to give [name] permission to discuss obesity behavioral modification therapy today.  ASSESS: Sherry Strong has the diagnosis of obesity and her BMI today is 32.94 Sherry Strong is in the action stage of change  ADVISE: Sherry Strong was educated on the multiple health risks of obesity as well as the benefit of weight loss to improve her health. She was advised of the need for long term treatment and the importance of lifestyle modifications.  AGREE: Multiple dietary modification options and treatment options were discussed and  Sherry Strong agreed to the above obesity treatment plan.   Wilhemena Durie, am acting as transcriptionist for Lacy Duverney, PA-C I, Lacy Duverney [facility], have reviewed this note and agree with its content

## 2018-01-14 ENCOUNTER — Ambulatory Visit (INDEPENDENT_AMBULATORY_CARE_PROVIDER_SITE_OTHER): Payer: Medicare Other | Admitting: Psychiatry

## 2018-01-14 ENCOUNTER — Encounter (HOSPITAL_COMMUNITY): Payer: Self-pay | Admitting: Psychiatry

## 2018-01-14 VITALS — BP 132/84 | HR 78 | Ht 63.0 in | Wt 193.0 lb

## 2018-01-14 DIAGNOSIS — F1099 Alcohol use, unspecified with unspecified alcohol-induced disorder: Secondary | ICD-10-CM | POA: Diagnosis not present

## 2018-01-14 DIAGNOSIS — Z818 Family history of other mental and behavioral disorders: Secondary | ICD-10-CM | POA: Diagnosis not present

## 2018-01-14 DIAGNOSIS — F3131 Bipolar disorder, current episode depressed, mild: Secondary | ICD-10-CM

## 2018-01-14 DIAGNOSIS — F411 Generalized anxiety disorder: Secondary | ICD-10-CM

## 2018-01-14 MED ORDER — VILAZODONE HCL 20 MG PO TABS: 20.0000 mg | ORAL_TABLET | Freq: Every day | ORAL | 1 refills | Status: DC

## 2018-01-14 MED ORDER — LURASIDONE HCL 80 MG PO TABS: 80.0000 mg | ORAL_TABLET | Freq: Every day | ORAL | 0 refills | Status: DC
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
## 2018-01-21 ENCOUNTER — Ambulatory Visit (INDEPENDENT_AMBULATORY_CARE_PROVIDER_SITE_OTHER): Payer: Medicare Other | Admitting: Physician Assistant

## 2018-01-21 VITALS — BP 109/72 | HR 63 | Temp 98.4°F | Ht 64.0 in | Wt 190.0 lb

## 2018-01-21 DIAGNOSIS — Z6832 Body mass index (BMI) 32.0-32.9, adult: Secondary | ICD-10-CM | POA: Diagnosis not present

## 2018-01-21 DIAGNOSIS — E559 Vitamin D deficiency, unspecified: Secondary | ICD-10-CM | POA: Diagnosis not present

## 2018-01-21 DIAGNOSIS — E669 Obesity, unspecified: Secondary | ICD-10-CM

## 2018-01-22 NOTE — Progress Notes (Signed)
Office: [phone]  /  Fax: [phone]   HPI:   Chief Complaint: OBESITY Sherry Strong is here to discuss her progress with her obesity treatment plan. She is on the Category 2 plan and is following her eating plan approximately 80 % of the time. She states she is walking for 20 minutes 2-4 times per week. Sherry Strong continues to do well with weight loss. She eats out at breakfast.  Her weight is 190 lb (86.2 kg) today and has had a weight loss of 2 pounds over a period of 2 to 3 weeks since her last visit. She has lost 16 lbs since starting treatment with [name].  Vitamin D Deficiency Sherry Strong has a diagnosis of vitamin D deficiency. She is currently taking prescription Vit D and denies nausea, vomiting or muscle weakness.  ALLERGIES: Allergies  Allergen Reactions  . Vistaril  [Hydroxyzine Hcl] Swelling    MEDICATIONS: Current Outpatient Medications on File Prior to Visit  Medication Sig Dispense Refill  . ALPRAZolam (XANAX) 0.5 MG tablet Take 0.5 mg by mouth 2 (two) times daily as needed. For anxiety    . aspirin EC 81 MG tablet Take 81 mg by mouth.    . dexlansoprazole (DEXILANT) 60 MG capsule Take 60 mg by mouth daily.    . fenofibrate (TRICOR) 145 MG tablet Take 145 mg by mouth daily.  1  . ibuprofen (ADVIL,MOTRIN) 800 MG tablet Take 800 mg by mouth every 8 (eight) hours as needed for pain.    Marland Kitchen lamoTRIgine (LAMICTAL) 200 MG tablet Take 1 tablet (200 mg total) by mouth daily. 90 tablet 0  . lurasidone (LATUDA) 80 MG TABS tablet Take 1 tablet (80 mg total) by mouth daily with breakfast. 90 tablet 0  . metoprolol succinate (TOPROL-XL) 25 MG 24 hr tablet Take 25 mg by mouth daily.     . pantoprazole (PROTONIX) 40 MG tablet Take 40 mg by mouth daily.    . pravastatin (PRAVACHOL) 40 MG tablet Take 40 mg by mouth every evening.    . Vilazodone HCl (VIIBRYD) 20 MG TABS Take 1 tablet (20 mg total) by mouth daily. 30 tablet 1  . vitamin C (ASCORBIC ACID) 500 MG tablet Take 500 mg by mouth daily.    .  Vitamin D, Ergocalciferol, (DRISDOL) 50000 units CAPS capsule Take 1 capsule (50,000 Units total) by mouth every 7 (seven) days. 4 capsule 0   No current facility-administered medications on file prior to visit.     PAST MEDICAL HISTORY: Past Medical History:  Diagnosis Date  . Anxiety   . Bipolar disorder ([place])   . Complication of anesthesia   . Depression   . GERD (gastroesophageal reflux disease)   . Heart murmur    echo- 08/30/15-epic   . History of hiatal hernia   . HLD (hyperlipidemia)   . HTN (hypertension)   . Infertility, female   . Menopause   . PONV (postoperative nausea and vomiting)     PAST SURGICAL HISTORY: Past Surgical History:  Procedure Laterality Date  . CESAREAN SECTION    . CHOLECYSTECTOMY N/A 10/05/2017   Procedure: LAPAROSCOPIC CHOLECYSTECTOMY WITH INTRAOPERATIVE CHOLANGIOGRAM;  Surgeon: Alphonsa Overall, MD;  Location: [facility];  Service: General;  Laterality: N/A;  . LAPAROSCOPY      SOCIAL HISTORY: Social History   Tobacco Use  . Smoking status: Never Smoker  . Smokeless tobacco: Never Used  Substance Use Topics  . Alcohol use: Yes    Alcohol/week: 3.0 - 3.6 oz  Types: 5 - 6 Cans of beer per week    Comment: occas   . Drug use: No    FAMILY HISTORY: Family History  Problem Relation Age of Onset  . Heart disease Mother   . Depression Mother   . Hyperlipidemia Father   . Hypertension Father   . Heart disease Father   . Cancer Father   . Depression Father   . Anxiety disorder Father   . Sleep apnea Father     ROS: Review of Systems  Constitutional: Positive for weight loss.  Gastrointestinal: Negative for nausea and vomiting.  Musculoskeletal:       Negative muscle weakness    PHYSICAL EXAM: Blood pressure 109/72, pulse 63, temperature 98.4 F (36.9 C), temperature source Oral, height 5\' 4"  (1.626 m), weight 190 lb (86.2 kg), SpO2 99 %. Body mass index is 32.61 kg/m. Physical Exam  Constitutional: She is oriented to person,  place, and time. She appears well-developed and well-nourished.  Cardiovascular: Normal rate.  Pulmonary/Chest: Effort normal.  Musculoskeletal: Normal range of motion.  Neurological: She is oriented to person, place, and time.  Skin: Skin is warm and dry.  Psychiatric: She has a normal mood and affect. Her behavior is normal.  Vitals reviewed.   RECENT LABS AND TESTS: BMET    Component Value Date/Time   NA 139 10/04/2017 1137   NA 142 07/12/2017 1055   K 4.1 10/04/2017 1137   CL 106 10/04/2017 1137   CO2 25 10/04/2017 1137   GLUCOSE 121 (H) 10/04/2017 1137   BUN 9 10/04/2017 1137   BUN 9 07/12/2017 1055   CREATININE 0.74 10/04/2017 1137   CALCIUM 9.3 10/04/2017 1137   GFRNONAA >60 10/04/2017 1137   GFRAA >60 10/04/2017 1137   Lab Results  Component Value Date   HGBA1C 5.4 07/12/2017   Lab Results  Component Value Date   INSULIN 20.6 07/12/2017   CBC    Component Value Date/Time   WBC 7.2 10/04/2017 1137   RBC 3.90 10/04/2017 1137   HGB 12.7 10/04/2017 1137   HGB 13.1 07/12/2017 1055   HCT 38.3 10/04/2017 1137   HCT 40.1 07/12/2017 1055   PLT 237 10/04/2017 1137   MCV 98.2 10/04/2017 1137   MCV 99 (H) 07/12/2017 1055   MCH 32.6 10/04/2017 1137   MCHC 33.2 10/04/2017 1137   RDW 13.1 10/04/2017 1137   RDW 13.1 07/12/2017 1055   LYMPHSABS 1.2 10/04/2017 1137   LYMPHSABS 1.5 07/12/2017 1055   MONOABS 0.6 10/04/2017 1137   EOSABS 0.1 10/04/2017 1137   EOSABS 0.1 07/12/2017 1055   BASOSABS 0.0 10/04/2017 1137   BASOSABS 0.0 07/12/2017 1055   Iron/TIBC/Ferritin/ %Sat No results found for: IRON, TIBC, FERRITIN, IRONPCTSAT Lipid Panel     Component Value Date/Time   CHOL 225 (H) 07/12/2017 1055   TRIG 105 07/12/2017 1055   HDL 53 07/12/2017 1055   LDLCALC 151 (H) 07/12/2017 1055   Hepatic Function Panel     Component Value Date/Time   PROT 7.7 10/04/2017 1137   PROT 7.3 07/12/2017 1055   ALBUMIN 4.0 10/04/2017 1137   ALBUMIN 4.8 07/12/2017 1055   AST  63 (H) 10/04/2017 1137   ALT 77 (H) 10/04/2017 1137   ALKPHOS 111 10/04/2017 1137   BILITOT 0.8 10/04/2017 1137   BILITOT 0.2 07/12/2017 1055      Component Value Date/Time   TSH 2.420 07/12/2017 1055  Results for Drotar, Sherry Strong (MRN [id number]) as of 01/22/2018 11:34  Ref. Range 07/12/2017 10:55  Vitamin D, 25-Hydroxy Latest Ref Range: 30.0 - 100.0 ng/mL 25.9 (L)    ASSESSMENT AND PLAN: Vitamin D deficiency  Class 1 obesity without serious comorbidity with body mass index (BMI) of 32.0 to 32.9 in adult, unspecified obesity type  PLAN:  Vitamin D Deficiency Hanah was informed that low vitamin D levels contributes to fatigue and are associated with obesity, breast, and colon cancer. Sharne agrees to continue taking prescription Vit D @50 ,000 IU every week and will follow up for routine testing of vitamin D, at least 2-3 times per year. She was informed of the risk of over-replacement of vitamin D and agrees to not increase her dose unless she discusses this with [name] first. Eloyce agrees to follow up with our clinic in 2 weeks.  We spent > than 50% of the 15 minute visit on the counseling as documented in the note.  Obesity Gladis is currently in the action stage of change. As such, her goal is to continue with weight loss efforts She has agreed to keep a food journal with 300 calories and 25 grams of protein at breakfast daily and follow the Category 2 plan Pernell has been instructed to work up to a goal of 150 minutes of combined cardio and strengthening exercise per week for weight loss and overall health benefits. We discussed the following Behavioral Modification Strategies today: increasing lean protein intake and keep a strict food journal   Glinda has agreed to follow up with our clinic in 2 weeks. She was informed of the importance of frequent follow up visits to maximize her success with intensive lifestyle modifications for her multiple health conditions.   OBESITY BEHAVIORAL  INTERVENTION VISIT  Today's visit was # 12 out of 22.  Starting weight: 206 lbs Starting date: 07/12/17 Today's weight : 190 lbs Today's date: 01/21/2018 Total lbs lost to date: 16 (Patients must lose 7 lbs in the first 6 months to continue with counseling)   ASK: We discussed the diagnosis of obesity with Mitchell Epling [id number] today and Azyria agreed to give [name] permission to discuss obesity behavioral modification therapy today.  ASSESS: Arlinda has the diagnosis of obesity and her BMI today is 32.6 Saige is in the action stage of change   ADVISE: Merri was educated on the multiple health risks of obesity as well as the benefit of weight loss to improve her health. She was advised of the need for long term treatment and the importance of lifestyle modifications.  AGREE: Multiple dietary modification options and treatment options were discussed and  Milady agreed to the above obesity treatment plan.   Wilhemena Durie, am acting as transcriptionist for Lacy Duverney, PA-C I, Lacy Duverney [facility], have reviewed this note and agree with its content

## 2018-02-05 ENCOUNTER — Ambulatory Visit (INDEPENDENT_AMBULATORY_CARE_PROVIDER_SITE_OTHER): Payer: Medicare Other | Admitting: Physician Assistant

## 2018-02-05 VITALS — BP 113/73 | HR 70 | Temp 97.9°F | Ht 64.0 in | Wt 193.0 lb

## 2018-02-05 DIAGNOSIS — Z6833 Body mass index (BMI) 33.0-33.9, adult: Secondary | ICD-10-CM

## 2018-02-05 DIAGNOSIS — E7849 Other hyperlipidemia: Secondary | ICD-10-CM

## 2018-02-05 DIAGNOSIS — E669 Obesity, unspecified: Secondary | ICD-10-CM | POA: Diagnosis not present

## 2018-02-05 DIAGNOSIS — E559 Vitamin D deficiency, unspecified: Secondary | ICD-10-CM | POA: Diagnosis not present

## 2018-02-05 MED ORDER — VITAMIN D (ERGOCALCIFEROL) 1.25 MG (50000 UNIT) PO CAPS: 50000.0000 [IU] | ORAL_CAPSULE | ORAL | 0 refills | Status: DC

## 2018-02-05 NOTE — Progress Notes (Signed)
Office: [phone]  /  Fax: [phone]   HPI:   Chief Complaint: OBESITY Oval is here to discuss her progress with her obesity treatment plan. She is on the keep a food journal with 300 calories and 25 grams of protein at breakfast daily and follow the Category 2 plan and is following her eating plan approximately 50 % of the time. She states she is exercising 0 minutes 0 times per week. Jerolene is off track with her eating and hasn't been as mindful of her eating. Also, had increase sabotage at home.  Her weight is 193 lb (87.5 kg) today and has gained 3 pounds since her last visit. She has lost 13 lbs since starting treatment with [name].  Vitamin D Deficiency Louretta has a diagnosis of vitamin D deficiency. She is currently taking prescription Vit D and denies nausea, vomiting or muscle weakness.  Hyperlipidemia Porshea has hyperlipidemia and has been trying to improve her cholesterol levels with intensive lifestyle modification including a low saturated fat diet, exercise and weight loss. She is on Pravachol and she denies any chest pain, claudication or myalgias.  ALLERGIES: Allergies  Allergen Reactions  . Vistaril  [Hydroxyzine Hcl] Swelling    MEDICATIONS: Current Outpatient Medications on File Prior to Visit  Medication Sig Dispense Refill  . ALPRAZolam (XANAX) 0.5 MG tablet Take 0.5 mg by mouth 2 (two) times daily as needed. For anxiety    . aspirin EC 81 MG tablet Take 81 mg by mouth.    . dexlansoprazole (DEXILANT) 60 MG capsule Take 60 mg by mouth daily.    . fenofibrate (TRICOR) 145 MG tablet Take 145 mg by mouth daily.  1  . ibuprofen (ADVIL,MOTRIN) 800 MG tablet Take 800 mg by mouth every 8 (eight) hours as needed for pain.    Marland Kitchen lamoTRIgine (LAMICTAL) 200 MG tablet Take 1 tablet (200 mg total) by mouth daily. 90 tablet 0  . lurasidone (LATUDA) 80 MG TABS tablet Take 1 tablet (80 mg total) by mouth daily with breakfast. 90 tablet 0  . metoprolol succinate (TOPROL-XL) 25 MG  24 hr tablet Take 25 mg by mouth daily.     . pantoprazole (PROTONIX) 40 MG tablet Take 40 mg by mouth daily.    . pravastatin (PRAVACHOL) 40 MG tablet Take 40 mg by mouth every evening.    . Vilazodone HCl (VIIBRYD) 20 MG TABS Take 1 tablet (20 mg total) by mouth daily. 30 tablet 1  . vitamin C (ASCORBIC ACID) 500 MG tablet Take 500 mg by mouth daily.     No current facility-administered medications on file prior to visit.     PAST MEDICAL HISTORY: Past Medical History:  Diagnosis Date  . Anxiety   . Bipolar disorder ([place])   . Complication of anesthesia   . Depression   . GERD (gastroesophageal reflux disease)   . Heart murmur    echo- 08/30/15-epic   . History of hiatal hernia   . HLD (hyperlipidemia)   . HTN (hypertension)   . Infertility, female   . Menopause   . PONV (postoperative nausea and vomiting)     PAST SURGICAL HISTORY: Past Surgical History:  Procedure Laterality Date  . CESAREAN SECTION    . CHOLECYSTECTOMY N/A 10/05/2017   Procedure: LAPAROSCOPIC CHOLECYSTECTOMY WITH INTRAOPERATIVE CHOLANGIOGRAM;  Surgeon: Alphonsa Overall, MD;  Location: [facility];  Service: General;  Laterality: N/A;  . LAPAROSCOPY      SOCIAL HISTORY: Social History   Tobacco Use  . Smoking  status: Never Smoker  . Smokeless tobacco: Never Used  Substance Use Topics  . Alcohol use: Yes    Alcohol/week: 3.0 - 3.6 oz    Types: 5 - 6 Cans of beer per week    Comment: occas   . Drug use: No    FAMILY HISTORY: Family History  Problem Relation Age of Onset  . Heart disease Mother   . Depression Mother   . Hyperlipidemia Father   . Hypertension Father   . Heart disease Father   . Cancer Father   . Depression Father   . Anxiety disorder Father   . Sleep apnea Father     ROS: Review of Systems  Constitutional: Negative for weight loss.  Cardiovascular: Negative for chest pain and claudication.  Gastrointestinal: Negative for nausea and vomiting.  Musculoskeletal: Negative for  myalgias.       Negative muscle weakness    PHYSICAL EXAM: Blood pressure 113/73, pulse 70, temperature 97.9 F (36.6 C), temperature source Oral, height 5\' 4"  (1.626 m), weight 193 lb (87.5 kg), SpO2 96 %. Body mass index is 33.13 kg/m. Physical Exam  Constitutional: She appears well-developed and well-nourished.  Cardiovascular: Normal rate.  Pulmonary/Chest: Effort normal.  Musculoskeletal: Normal range of motion.  Skin: Skin is warm and dry.  Psychiatric: She has a normal mood and affect. Her behavior is normal.  Vitals reviewed.   RECENT LABS AND TESTS: BMET    Component Value Date/Time   NA 139 10/04/2017 1137   NA 142 07/12/2017 1055   K 4.1 10/04/2017 1137   CL 106 10/04/2017 1137   CO2 25 10/04/2017 1137   GLUCOSE 121 (H) 10/04/2017 1137   BUN 9 10/04/2017 1137   BUN 9 07/12/2017 1055   CREATININE 0.74 10/04/2017 1137   CALCIUM 9.3 10/04/2017 1137   GFRNONAA >60 10/04/2017 1137   GFRAA >60 10/04/2017 1137   Lab Results  Component Value Date   HGBA1C 5.4 07/12/2017   Lab Results  Component Value Date   INSULIN 20.6 07/12/2017   CBC    Component Value Date/Time   WBC 7.2 10/04/2017 1137   RBC 3.90 10/04/2017 1137   HGB 12.7 10/04/2017 1137   HGB 13.1 07/12/2017 1055   HCT 38.3 10/04/2017 1137   HCT 40.1 07/12/2017 1055   PLT 237 10/04/2017 1137   MCV 98.2 10/04/2017 1137   MCV 99 (H) 07/12/2017 1055   MCH 32.6 10/04/2017 1137   MCHC 33.2 10/04/2017 1137   RDW 13.1 10/04/2017 1137   RDW 13.1 07/12/2017 1055   LYMPHSABS 1.2 10/04/2017 1137   LYMPHSABS 1.5 07/12/2017 1055   MONOABS 0.6 10/04/2017 1137   EOSABS 0.1 10/04/2017 1137   EOSABS 0.1 07/12/2017 1055   BASOSABS 0.0 10/04/2017 1137   BASOSABS 0.0 07/12/2017 1055   Iron/TIBC/Ferritin/ %Sat No results found for: IRON, TIBC, FERRITIN, IRONPCTSAT Lipid Panel     Component Value Date/Time   CHOL 225 (H) 07/12/2017 1055   TRIG 105 07/12/2017 1055   HDL 53 07/12/2017 1055   LDLCALC 151  (H) 07/12/2017 1055   Hepatic Function Panel     Component Value Date/Time   PROT 7.7 10/04/2017 1137   PROT 7.3 07/12/2017 1055   ALBUMIN 4.0 10/04/2017 1137   ALBUMIN 4.8 07/12/2017 1055   AST 63 (H) 10/04/2017 1137   ALT 77 (H) 10/04/2017 1137   ALKPHOS 111 10/04/2017 1137   BILITOT 0.8 10/04/2017 1137   BILITOT 0.2 07/12/2017 1055  Component Value Date/Time   TSH 2.420 07/12/2017 1055  Results for Lalor, JAMEE KEACH (MRN [id number]) as of 02/05/2018 17:05  Ref. Range 07/12/2017 10:55  Vitamin D, 25-Hydroxy Latest Ref Range: 30.0 - 100.0 ng/mL 25.9 (L)    ASSESSMENT AND PLAN: Vitamin D deficiency - Plan: Vitamin D, Ergocalciferol, (DRISDOL) 50000 units CAPS capsule  Other hyperlipidemia  Class 1 obesity with serious comorbidity and body mass index (BMI) of 33.0 to 33.9 in adult, unspecified obesity type  PLAN:  Vitamin D Deficiency Mckaela was informed that low vitamin D levels contributes to fatigue and are associated with obesity, breast, and colon cancer. Israa agrees to continue taking prescription Vit D @50 ,000 IU every week #4 and we will refill for 1 month. She will follow up for routine testing of vitamin D, at least 2-3 times per year. She was informed of the risk of over-replacement of vitamin D and agrees to not increase her dose unless she discusses this with [name] first. Lessie agrees to follow up with our clinic in 2 weeks.  Hyperlipidemia Adah was informed of the American Heart Association Guidelines emphasizing intensive lifestyle modifications as the first line treatment for hyperlipidemia. We discussed many lifestyle modifications today in depth, and Krystal will continue to work on decreasing saturated fats such as fatty red meat, butter and many fried foods. She will also increase vegetables and lean protein in her diet and continue to work on exercise and weight loss efforts. Asusena agrees to continue her medications and she agrees to follow up with our clinic in 2  weeks.  Obesity Aviv is currently in the action stage of change. As such, her goal is to continue with weight loss efforts She has agreed to follow the Category 2 plan Sylvana has been instructed to work up to a goal of 150 minutes of combined cardio and strengthening exercise per week for weight loss and overall health benefits. We discussed the following Behavioral Modification Strategies today: decrease junk food and planning for success   Amijah has agreed to follow up with our clinic in 2 weeks. She was informed of the importance of frequent follow up visits to maximize her success with intensive lifestyle modifications for her multiple health conditions.   OBESITY BEHAVIORAL INTERVENTION VISIT  Today's visit was # 13 out of 22.  Starting weight: 206 lbs Starting date: 07/12/17 Today's weight : 193 lbs Today's date: 02/05/2018 Total lbs lost to date: 13 (Patients must lose 7 lbs in the first 6 months to continue with counseling)   ASK: We discussed the diagnosis of obesity with Camarie Mctigue [id number] today and Shulamit agreed to give [name] permission to discuss obesity behavioral modification therapy today.  ASSESS: Gladyes has the diagnosis of obesity and her BMI today is 33.11 Averey is in the action stage of change   ADVISE: Lakendria was educated on the multiple health risks of obesity as well as the benefit of weight loss to improve her health. She was advised of the need for long term treatment and the importance of lifestyle modifications.  AGREE: Multiple dietary modification options and treatment options were discussed and  Shakendra agreed to the above obesity treatment plan.   Wilhemena Durie, am acting as transcriptionist for Lacy Duverney, PA-C I, Lacy Duverney [facility], have reviewed this note and agree with its content

## 2018-02-22 ENCOUNTER — Ambulatory Visit: Payer: Medicare Other | Admitting: Psychology

## 2018-02-28 ENCOUNTER — Ambulatory Visit (INDEPENDENT_AMBULATORY_CARE_PROVIDER_SITE_OTHER): Payer: Medicare Other | Admitting: Physician Assistant

## 2018-02-28 VITALS — BP 125/80 | HR 72 | Temp 98.5°F | Ht 64.0 in | Wt 190.0 lb

## 2018-02-28 DIAGNOSIS — E559 Vitamin D deficiency, unspecified: Secondary | ICD-10-CM | POA: Diagnosis not present

## 2018-02-28 DIAGNOSIS — Z6832 Body mass index (BMI) 32.0-32.9, adult: Secondary | ICD-10-CM

## 2018-02-28 DIAGNOSIS — E669 Obesity, unspecified: Secondary | ICD-10-CM

## 2018-02-28 NOTE — Progress Notes (Signed)
Office: [phone]  /  Fax: [phone]   HPI:   Chief Complaint: OBESITY Sherry Strong is here to discuss her progress with her obesity treatment plan. She is on the Category 2 plan and is following her eating plan approximately 85 % of the time. She states she is exercising at the gym for 60 minutes 4 times per week. Sherry Strong continues to do well with weight loss. She would like to incorporate more vegetables into her eating. Her weight is 190 lb (86.2 kg) today and has had a weight loss of 3 pounds over a period of 3 weeks since her last visit. She has lost 16 lbs since starting treatment with [name].  Vitamin D deficiency Sherry Strong has a diagnosis of vitamin D deficiency. She is currently taking vit D and denies nausea, vomiting or muscle weakness.  ALLERGIES: Allergies  Allergen Reactions  . Vistaril  [Hydroxyzine Hcl] Swelling    MEDICATIONS: Current Outpatient Medications on File Prior to Visit  Medication Sig Dispense Refill  . ALPRAZolam (XANAX) 0.5 MG tablet Take 0.5 mg by mouth 2 (two) times daily as needed. For anxiety    . aspirin EC 81 MG tablet Take 81 mg by mouth.    . dexlansoprazole (DEXILANT) 60 MG capsule Take 60 mg by mouth daily.    . fenofibrate (TRICOR) 145 MG tablet Take 145 mg by mouth daily.  1  . ibuprofen (ADVIL,MOTRIN) 800 MG tablet Take 800 mg by mouth every 8 (eight) hours as needed for pain.    Marland Kitchen lamoTRIgine (LAMICTAL) 200 MG tablet Take 1 tablet (200 mg total) by mouth daily. 90 tablet 0  . lurasidone (LATUDA) 80 MG TABS tablet Take 1 tablet (80 mg total) by mouth daily with breakfast. 90 tablet 0  . metoprolol succinate (TOPROL-XL) 25 MG 24 hr tablet Take 25 mg by mouth daily.     . pantoprazole (PROTONIX) 40 MG tablet Take 40 mg by mouth daily.    . pravastatin (PRAVACHOL) 40 MG tablet Take 40 mg by mouth every evening.    . Vilazodone HCl (VIIBRYD) 20 MG TABS Take 1 tablet (20 mg total) by mouth daily. 30 tablet 1  . vitamin C (ASCORBIC ACID) 500 MG tablet Take  500 mg by mouth daily.    . Vitamin D, Ergocalciferol, (DRISDOL) 50000 units CAPS capsule Take 1 capsule (50,000 Units total) by mouth every 7 (seven) days. 4 capsule 0   No current facility-administered medications on file prior to visit.     PAST MEDICAL HISTORY: Past Medical History:  Diagnosis Date  . Anxiety   . Bipolar disorder ([place])   . Complication of anesthesia   . Depression   . GERD (gastroesophageal reflux disease)   . Heart murmur    echo- 08/30/15-epic   . History of hiatal hernia   . HLD (hyperlipidemia)   . HTN (hypertension)   . Infertility, female   . Menopause   . PONV (postoperative nausea and vomiting)     PAST SURGICAL HISTORY: Past Surgical History:  Procedure Laterality Date  . CESAREAN SECTION    . CHOLECYSTECTOMY N/A 10/05/2017   Procedure: LAPAROSCOPIC CHOLECYSTECTOMY WITH INTRAOPERATIVE CHOLANGIOGRAM;  Surgeon: Alphonsa Overall, MD;  Location: [facility];  Service: General;  Laterality: N/A;  . LAPAROSCOPY      SOCIAL HISTORY: Social History   Tobacco Use  . Smoking status: Never Smoker  . Smokeless tobacco: Never Used  Substance Use Topics  . Alcohol use: Yes    Alcohol/week: 3.0 - 3.6  oz    Types: 5 - 6 Cans of beer per week    Comment: occas   . Drug use: No    FAMILY HISTORY: Family History  Problem Relation Age of Onset  . Heart disease Mother   . Depression Mother   . Hyperlipidemia Father   . Hypertension Father   . Heart disease Father   . Cancer Father   . Depression Father   . Anxiety disorder Father   . Sleep apnea Father     ROS: Review of Systems  Constitutional: Positive for weight loss.  Gastrointestinal: Negative for nausea and vomiting.  Musculoskeletal:       Negative for muscle weakness    PHYSICAL EXAM: Blood pressure 125/80, pulse 72, temperature 98.5 F (36.9 C), temperature source Oral, height 5\' 4"  (1.626 Strong), weight 190 lb (86.2 kg), SpO2 97 %. Body mass index is 32.61 kg/Strong. Physical Exam    Constitutional: She is oriented to person, place, and time. She appears well-developed and well-nourished.  Cardiovascular: Normal rate.  Pulmonary/Chest: Effort normal.  Musculoskeletal: Normal range of motion.  Neurological: She is oriented to person, place, and time.  Skin: Skin is warm and dry.  Psychiatric: She has a normal mood and affect. Her behavior is normal.  Vitals reviewed.   RECENT LABS AND TESTS: BMET    Component Value Date/Time   NA 139 10/04/2017 1137   NA 142 07/12/2017 1055   K 4.1 10/04/2017 1137   CL 106 10/04/2017 1137   CO2 25 10/04/2017 1137   GLUCOSE 121 (H) 10/04/2017 1137   BUN 9 10/04/2017 1137   BUN 9 07/12/2017 1055   CREATININE 0.74 10/04/2017 1137   CALCIUM 9.3 10/04/2017 1137   GFRNONAA >60 10/04/2017 1137   GFRAA >60 10/04/2017 1137   Lab Results  Component Value Date   HGBA1C 5.4 07/12/2017   Lab Results  Component Value Date   INSULIN 20.6 07/12/2017   CBC    Component Value Date/Time   WBC 7.2 10/04/2017 1137   RBC 3.90 10/04/2017 1137   HGB 12.7 10/04/2017 1137   HGB 13.1 07/12/2017 1055   HCT 38.3 10/04/2017 1137   HCT 40.1 07/12/2017 1055   PLT 237 10/04/2017 1137   MCV 98.2 10/04/2017 1137   MCV 99 (H) 07/12/2017 1055   MCH 32.6 10/04/2017 1137   MCHC 33.2 10/04/2017 1137   RDW 13.1 10/04/2017 1137   RDW 13.1 07/12/2017 1055   LYMPHSABS 1.2 10/04/2017 1137   LYMPHSABS 1.5 07/12/2017 1055   MONOABS 0.6 10/04/2017 1137   EOSABS 0.1 10/04/2017 1137   EOSABS 0.1 07/12/2017 1055   BASOSABS 0.0 10/04/2017 1137   BASOSABS 0.0 07/12/2017 1055   Iron/TIBC/Ferritin/ %Sat No results found for: IRON, TIBC, FERRITIN, IRONPCTSAT Lipid Panel     Component Value Date/Time   CHOL 225 (H) 07/12/2017 1055   TRIG 105 07/12/2017 1055   HDL 53 07/12/2017 1055   LDLCALC 151 (H) 07/12/2017 1055   Hepatic Function Panel     Component Value Date/Time   PROT 7.7 10/04/2017 1137   PROT 7.3 07/12/2017 1055   ALBUMIN 4.0  10/04/2017 1137   ALBUMIN 4.8 07/12/2017 1055   AST 63 (H) 10/04/2017 1137   ALT 77 (H) 10/04/2017 1137   ALKPHOS 111 10/04/2017 1137   BILITOT 0.8 10/04/2017 1137   BILITOT 0.2 07/12/2017 1055      Component Value Date/Time   TSH 2.420 07/12/2017 1055   Results for Heckman, Sherry Strong (  MRN [id number]) as of 02/28/2018 16:08  Ref. Range 07/12/2017 10:55  Vitamin D, 25-Hydroxy Latest Ref Range: 30.0 - 100.0 ng/mL 25.9 (L)   ASSESSMENT AND PLAN: Vitamin D deficiency  Class 1 obesity with serious comorbidity and body mass index (BMI) of 32.0 to 32.9 in adult, unspecified obesity type  PLAN:  Vitamin D Deficiency Sherry Strong was informed that low vitamin D levels contributes to fatigue and are associated with obesity, breast, and colon cancer. She agrees to continue to take prescription Vit D @50 ,000 IU every week and will follow up for routine testing of vitamin D, at least 2-3 times per year. She was informed of the risk of over-replacement of vitamin D and agrees to not increase her dose unless she discusses this with [name] first.  We spent > than 50% of the 15 minute visit on the counseling as documented in the note.  Obesity Sherry Strong is currently in the action stage of change. As such, her goal is to continue with weight loss efforts She has agreed to follow a lower carbohydrate, vegetable and lean protein rich diet plan Sherry Strong has been instructed to work up to a goal of 150 minutes of combined cardio and strengthening exercise per week for weight loss and overall health benefits. We discussed the following Behavioral Modification Strategies today: increasing lean protein intake and work on meal planning and easy cooking plans  Sherry Strong has agreed to follow up with our clinic in 2 weeks. She was informed of the importance of frequent follow up visits to maximize her success with intensive lifestyle modifications for her multiple health conditions.   OBESITY BEHAVIORAL INTERVENTION VISIT  Today's visit  was # 14 out of 22.  Starting weight: 206 lbs Starting date: 07/12/17 Today's weight : 190 lbs Today's date: 02/28/2018 Total lbs lost to date: 16 (Patients must lose 7 lbs in the first 6 months to continue with counseling)   ASK: We discussed the diagnosis of obesity with Sherry Strong today and Sherry Strong agreed to give [name] permission to discuss obesity behavioral modification therapy today.  ASSESS: Sherry Strong has the diagnosis of obesity and her BMI today is 32.6 Sherry Strong is in the action stage of change   ADVISE: Sherry Strong was educated on the multiple health risks of obesity as well as the benefit of weight loss to improve her health. She was advised of the need for long term treatment and the importance of lifestyle modifications.  AGREE: Multiple dietary modification options and treatment options were discussed and  Sherry Strong agreed to the above obesity treatment plan.   Corey Skains, am acting as transcriptionist for [employer], PA-C I, Lacy Duverney [facility], have reviewed this note and agree with its content

## 2018-03-18 ENCOUNTER — Ambulatory Visit (INDEPENDENT_AMBULATORY_CARE_PROVIDER_SITE_OTHER): Payer: Medicare Other | Admitting: Physician Assistant

## 2018-03-19 ENCOUNTER — Ambulatory Visit (HOSPITAL_COMMUNITY): Payer: Medicare Other | Admitting: Psychiatry

## 2018-03-19 ENCOUNTER — Telehealth (HOSPITAL_COMMUNITY): Payer: Self-pay

## 2018-03-19 NOTE — Telephone Encounter (Signed)
Patient is having trouble sleeping, she said she is waking frequently during the night and is not sure why. Patient states in the past Trazodone has helped but she wants to know what you think and if it is okay, to send in the Trazodone. Please review and advise, thank you

## 2018-03-20 ENCOUNTER — Other Ambulatory Visit (HOSPITAL_COMMUNITY): Payer: Self-pay | Admitting: Psychiatry

## 2018-03-20 MED ORDER — TRAZODONE HCL 50 MG PO TABS: ORAL_TABLET | ORAL | 0 refills | Status: DC

## 2018-03-20 NOTE — Telephone Encounter (Signed)
She can try trazodone 50 mg and may repeat if 50 mg did not help her sleep.

## 2018-03-20 NOTE — Telephone Encounter (Signed)
I called patient and she is agreeable, the new prescription was sent to the pharmacy.

## 2018-04-02 ENCOUNTER — Ambulatory Visit (INDEPENDENT_AMBULATORY_CARE_PROVIDER_SITE_OTHER): Payer: Medicare Other | Admitting: Psychiatry

## 2018-04-02 DIAGNOSIS — F3131 Bipolar disorder, current episode depressed, mild: Secondary | ICD-10-CM | POA: Diagnosis not present

## 2018-04-03 ENCOUNTER — Ambulatory Visit (INDEPENDENT_AMBULATORY_CARE_PROVIDER_SITE_OTHER): Payer: Medicare Other | Admitting: Physician Assistant

## 2018-04-03 VITALS — BP 111/76 | HR 67 | Temp 98.4°F | Ht 64.0 in | Wt 192.0 lb

## 2018-04-03 DIAGNOSIS — Z6833 Body mass index (BMI) 33.0-33.9, adult: Secondary | ICD-10-CM

## 2018-04-03 DIAGNOSIS — E7849 Other hyperlipidemia: Secondary | ICD-10-CM

## 2018-04-03 DIAGNOSIS — E669 Obesity, unspecified: Secondary | ICD-10-CM | POA: Diagnosis not present

## 2018-04-03 DIAGNOSIS — E559 Vitamin D deficiency, unspecified: Secondary | ICD-10-CM | POA: Diagnosis not present

## 2018-04-03 DIAGNOSIS — Z9189 Other specified personal risk factors, not elsewhere classified: Secondary | ICD-10-CM | POA: Diagnosis not present

## 2018-04-03 MED ORDER — VITAMIN D (ERGOCALCIFEROL) 1.25 MG (50000 UNIT) PO CAPS: 50000.0000 [IU] | ORAL_CAPSULE | ORAL | 0 refills | Status: DC
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
## 2018-04-04 NOTE — Progress Notes (Signed)
Office: [phone]  /  Fax: [phone]   HPI:   Chief Complaint: OBESITY Sherry Strong is here to discuss her progress with her obesity treatment plan. She is on the lower carbohydrate, vegetable and lean protein rich diet plan and is following her eating plan approximately 60 % of the time. She states she is exercising 0 minutes 0 times per week. Sherry Strong continues to have challenges preplanning her meals. She also states, she would like to go back to the structured meal plan. Her weight is 192 lb (87.1 kg) today and has had a weight gain of 2 pounds over a period of 5 weeks since her last visit. She has lost 14 lbs since starting treatment with [name].  Vitamin D deficiency Sherry Strong has a diagnosis of vitamin D deficiency. She is currently taking vit D and denies nausea, vomiting or muscle weakness. Patient states she had labs at her PCP, but they are not available.  At risk for osteopenia and osteoporosis Sherry Strong is at higher risk of osteopenia and osteoporosis due to vitamin D deficiency.   Hyperlipidemia Sherry Strong has hyperlipidemia and has been trying to improve her cholesterol levels with intensive lifestyle modification including a low saturated fat diet, exercise and weight loss. She denies any chest pain, claudication or myalgias.  ALLERGIES: Allergies  Allergen Reactions  . Vistaril  [Hydroxyzine Hcl] Swelling    MEDICATIONS: Current Outpatient Medications on File Prior to Visit  Medication Sig Dispense Refill  . ALPRAZolam (XANAX) 0.5 MG tablet Take 0.5 mg by mouth 2 (two) times daily as needed. For anxiety    . aspirin EC 81 MG tablet Take 81 mg by mouth.    . dexlansoprazole (DEXILANT) 60 MG capsule Take 60 mg by mouth daily.    . fenofibrate (TRICOR) 145 MG tablet Take 145 mg by mouth daily.  1  . lamoTRIgine (LAMICTAL) 200 MG tablet Take 1 tablet (200 mg total) by mouth daily. 90 tablet 0  . lurasidone (LATUDA) 80 MG TABS tablet Take 1 tablet (80 mg total) by mouth daily with breakfast.  90 tablet 0  . metoprolol succinate (TOPROL-XL) 25 MG 24 hr tablet Take 25 mg by mouth daily.     . pantoprazole (PROTONIX) 40 MG tablet Take 40 mg by mouth daily.    . pravastatin (PRAVACHOL) 40 MG tablet Take 40 mg by mouth every evening.    . traZODone (DESYREL) 50 MG tablet Take one tablet (50 mg) at bedtime, may take one more if needed 60 tablet 0  . Vilazodone HCl (VIIBRYD) 20 MG TABS Take 1 tablet (20 mg total) by mouth daily. 30 tablet 1  . vitamin C (ASCORBIC ACID) 500 MG tablet Take 500 mg by mouth daily.    Marland Kitchen ibuprofen (ADVIL,MOTRIN) 800 MG tablet Take 800 mg by mouth every 8 (eight) hours as needed for pain.     No current facility-administered medications on file prior to visit.     PAST MEDICAL HISTORY: Past Medical History:  Diagnosis Date  . Anxiety   . Bipolar disorder ([place])   . Complication of anesthesia   . Depression   . GERD (gastroesophageal reflux disease)   . Heart murmur    echo- 08/30/15-epic   . History of hiatal hernia   . HLD (hyperlipidemia)   . HTN (hypertension)   . Infertility, female   . Menopause   . PONV (postoperative nausea and vomiting)     PAST SURGICAL HISTORY: Past Surgical History:  Procedure Laterality Date  . CESAREAN  SECTION    . CHOLECYSTECTOMY N/A 10/05/2017   Procedure: LAPAROSCOPIC CHOLECYSTECTOMY WITH INTRAOPERATIVE CHOLANGIOGRAM;  Surgeon: Alphonsa Overall, MD;  Location: [facility];  Service: General;  Laterality: N/A;  . LAPAROSCOPY      SOCIAL HISTORY: Social History   Tobacco Use  . Smoking status: Never Smoker  . Smokeless tobacco: Never Used  Substance Use Topics  . Alcohol use: Yes    Alcohol/week: 3.0 - 3.6 oz    Types: 5 - 6 Cans of beer per week    Comment: occas   . Drug use: No    FAMILY HISTORY: Family History  Problem Relation Age of Onset  . Heart disease Mother   . Depression Mother   . Hyperlipidemia Father   . Hypertension Father   . Heart disease Father   . Cancer Father   . Depression Father     . Anxiety disorder Father   . Sleep apnea Father     ROS: Review of Systems  Constitutional: Negative for weight loss.  Cardiovascular: Negative for chest pain and claudication.  Gastrointestinal: Negative for nausea and vomiting.  Musculoskeletal: Negative for myalgias.       Negative for muscle weakness    PHYSICAL EXAM: Blood pressure 111/76, pulse 67, temperature 98.4 F (36.9 C), temperature source Oral, height 5\' 4"  (1.626 m), weight 192 lb (87.1 kg), SpO2 99 %. Body mass index is 32.96 kg/m. Physical Exam  Constitutional: She is oriented to person, place, and time. She appears well-developed and well-nourished.  Cardiovascular: Normal rate.  Pulmonary/Chest: Effort normal.  Musculoskeletal: Normal range of motion.  Neurological: She is oriented to person, place, and time.  Skin: Skin is warm and dry.  Psychiatric: She has a normal mood and affect. Her behavior is normal.  Vitals reviewed.   RECENT LABS AND TESTS: BMET    Component Value Date/Time   NA 139 10/04/2017 1137   NA 142 07/12/2017 1055   K 4.1 10/04/2017 1137   CL 106 10/04/2017 1137   CO2 25 10/04/2017 1137   GLUCOSE 121 (H) 10/04/2017 1137   BUN 9 10/04/2017 1137   BUN 9 07/12/2017 1055   CREATININE 0.74 10/04/2017 1137   CALCIUM 9.3 10/04/2017 1137   GFRNONAA >60 10/04/2017 1137   GFRAA >60 10/04/2017 1137   Lab Results  Component Value Date   HGBA1C 5.4 07/12/2017   Lab Results  Component Value Date   INSULIN 20.6 07/12/2017   CBC    Component Value Date/Time   WBC 7.2 10/04/2017 1137   RBC 3.90 10/04/2017 1137   HGB 12.7 10/04/2017 1137   HGB 13.1 07/12/2017 1055   HCT 38.3 10/04/2017 1137   HCT 40.1 07/12/2017 1055   PLT 237 10/04/2017 1137   MCV 98.2 10/04/2017 1137   MCV 99 (H) 07/12/2017 1055   MCH 32.6 10/04/2017 1137   MCHC 33.2 10/04/2017 1137   RDW 13.1 10/04/2017 1137   RDW 13.1 07/12/2017 1055   LYMPHSABS 1.2 10/04/2017 1137   LYMPHSABS 1.5 07/12/2017 1055    MONOABS 0.6 10/04/2017 1137   EOSABS 0.1 10/04/2017 1137   EOSABS 0.1 07/12/2017 1055   BASOSABS 0.0 10/04/2017 1137   BASOSABS 0.0 07/12/2017 1055   Iron/TIBC/Ferritin/ %Sat No results found for: IRON, TIBC, FERRITIN, IRONPCTSAT Lipid Panel     Component Value Date/Time   CHOL 225 (H) 07/12/2017 1055   TRIG 105 07/12/2017 1055   HDL 53 07/12/2017 1055   LDLCALC 151 (H) 07/12/2017 1055   Hepatic  Function Panel     Component Value Date/Time   PROT 7.7 10/04/2017 1137   PROT 7.3 07/12/2017 1055   ALBUMIN 4.0 10/04/2017 1137   ALBUMIN 4.8 07/12/2017 1055   AST 63 (H) 10/04/2017 1137   ALT 77 (H) 10/04/2017 1137   ALKPHOS 111 10/04/2017 1137   BILITOT 0.8 10/04/2017 1137   BILITOT 0.2 07/12/2017 1055      Component Value Date/Time   TSH 2.420 07/12/2017 1055   Results for Strong, Sherry HANBACK (MRN [id number]) as of 04/04/2018 06:58  Ref. Range 07/12/2017 10:55  Vitamin D, 25-Hydroxy Latest Ref Range: 30.0 - 100.0 ng/mL 25.9 (L)   ASSESSMENT AND PLAN: Vitamin D deficiency - Plan: Vitamin D, Ergocalciferol, (DRISDOL) 50000 units CAPS capsule  Other hyperlipidemia  At risk for osteoporosis  Class 1 obesity with serious comorbidity and body mass index (BMI) of 33.0 to 33.9 in adult, unspecified obesity type  PLAN:  Vitamin D Deficiency Sherry Strong was informed that low vitamin D levels contributes to fatigue and are associated with obesity, breast, and colon cancer. She agrees to continue to take prescription Vit D @50 ,000 IU every week #4 with no refills and will follow up for routine testing of vitamin D, at least 2-3 times per year. She was informed of the risk of over-replacement of vitamin D and agrees to not increase her dose unless she discusses this with [name] first. Sherry Strong agrees to follow up as directed.  At risk for osteopenia and osteoporosis Sherry Strong is at risk for osteopenia and osteoporosis due to her vitamin D deficiency. She was encouraged to take her vitamin D and follow her  higher calcium diet and increase strengthening exercise to help strengthen her bones and decrease her risk of osteopenia and osteoporosis.  Hyperlipidemia Sherry Strong was informed of the American Heart Association Guidelines emphasizing intensive lifestyle modifications as the first line treatment for hyperlipidemia. We discussed many lifestyle modifications today in depth, and Sherry Strong will continue to work on decreasing saturated fats such as fatty red meat, butter and many fried foods. She will also increase vegetables and lean protein in her diet and continue to work on exercise and weight loss efforts. She will continue her medications as prescribed and follow up as directed.  Obesity Sherry Strong is currently in the action stage of change. As such, her goal is to continue with weight loss efforts She has agreed to follow the Category 2 plan Sherry Strong has been instructed to work up to a goal of 150 minutes of combined cardio and strengthening exercise per week for weight loss and overall health benefits. We discussed the following Behavioral Modification Strategies today: increasing lean protein intake and work on meal planning and easy cooking plans  Sherry Strong has agreed to follow up with our clinic in 3 weeks. She was informed of the importance of frequent follow up visits to maximize her success with intensive lifestyle modifications for her multiple health conditions.    OBESITY BEHAVIORAL INTERVENTION VISIT  Today's visit was # 15 out of 22.  Starting weight: 206 lbs Starting date: 07/12/17 Today's weight : 192 lbs Today's date: 04/03/2018 Total lbs lost to date: 14 (Patients must lose 7 lbs in the first 6 months to continue with counseling)   ASK: We discussed the diagnosis of obesity with Sherry Strong OQHUTML today and Sherry Strong agreed to give [name] permission to discuss obesity behavioral modification therapy today.  ASSESS: Sherry Strong has the diagnosis of obesity and her BMI today is 32.94 Sherry Strong is in the action  stage  of change   ADVISE: Sherry Strong was educated on the multiple health risks of obesity as well as the benefit of weight loss to improve her health. She was advised of the need for long term treatment and the importance of lifestyle modifications.  AGREE: Multiple dietary modification options and treatment options were discussed and  Kenisha agreed to the above obesity treatment plan.   Corey Skains, am acting as transcriptionist for [employer], PA-C I, Lacy Duverney [facility], have reviewed this note and agree with its content

## 2018-04-11 ENCOUNTER — Other Ambulatory Visit (HOSPITAL_COMMUNITY): Payer: Self-pay | Admitting: Psychiatry

## 2018-04-17 ENCOUNTER — Other Ambulatory Visit (HOSPITAL_COMMUNITY): Payer: Self-pay

## 2018-04-17 DIAGNOSIS — F3131 Bipolar disorder, current episode depressed, mild: Secondary | ICD-10-CM

## 2018-04-17 MED ORDER — LAMOTRIGINE 200 MG PO TABS: 200.0000 mg | ORAL_TABLET | Freq: Every day | ORAL | 0 refills | Status: DC

## 2018-04-21 ENCOUNTER — Other Ambulatory Visit (HOSPITAL_COMMUNITY): Payer: Self-pay | Admitting: Psychiatry

## 2018-04-21 DIAGNOSIS — F3131 Bipolar disorder, current episode depressed, mild: Secondary | ICD-10-CM

## 2018-04-23 ENCOUNTER — Ambulatory Visit (HOSPITAL_COMMUNITY): Payer: Medicare Other | Admitting: Psychiatry

## 2018-04-24 ENCOUNTER — Ambulatory Visit (INDEPENDENT_AMBULATORY_CARE_PROVIDER_SITE_OTHER): Payer: Medicare Other | Admitting: Family Medicine

## 2018-04-24 VITALS — BP 126/78 | HR 85 | Temp 98.3°F | Ht 64.0 in | Wt 195.0 lb

## 2018-04-24 DIAGNOSIS — Z9189 Other specified personal risk factors, not elsewhere classified: Secondary | ICD-10-CM

## 2018-04-24 DIAGNOSIS — E559 Vitamin D deficiency, unspecified: Secondary | ICD-10-CM | POA: Diagnosis not present

## 2018-04-24 DIAGNOSIS — E669 Obesity, unspecified: Secondary | ICD-10-CM | POA: Diagnosis not present

## 2018-04-24 DIAGNOSIS — Z6833 Body mass index (BMI) 33.0-33.9, adult: Secondary | ICD-10-CM | POA: Diagnosis not present

## 2018-04-24 DIAGNOSIS — E8881 Metabolic syndrome: Secondary | ICD-10-CM

## 2018-04-24 MED ORDER — METFORMIN HCL 500 MG PO TABS: 500.0000 mg | ORAL_TABLET | Freq: Every day | ORAL | 0 refills | Status: DC

## 2018-04-25 NOTE — Progress Notes (Signed)
Office: [phone]  /  Fax: [phone]   HPI:   Chief Complaint: OBESITY Sherry Strong is here to discuss her progress with her obesity treatment plan. She is on the Category 2 plan and is following her eating plan approximately 40 % of the time. She states she is exercising 0 minutes 0 times per week. Arlina feels she need to hit the reset button. Last month she really struggled with eating on the meal plan. Her weight is 195 lb (88.5 kg) today and has had a weight gain of 3 pounds over a period of 3 weeks since her last visit. She has lost 11 lbs since starting treatment with [name].  Insulin Resistance Avira has a diagnosis of insulin resistance based on her elevated fasting insulin level >5. Although Shaneka's blood glucose readings are still under good control, insulin resistance puts her at greater risk of metabolic syndrome and diabetes. She is not taking metformin currently and continues to work on diet and exercise to decrease risk of diabetes. Talaysha needs repeat labs ant the next appointment.  At risk for diabetes Elvia is at higher than average risk for developing diabetes due to her obesity and insulin resistance. She currently denies polyuria or polydipsia.  Vitamin D deficiency Delmar has a diagnosis of vitamin D deficiency. She is currently taking vit D and admits fatigue, but denies nausea, vomiting or muscle weakness.  ALLERGIES: Allergies  Allergen Reactions  . Vistaril  [Hydroxyzine Hcl] Swelling    MEDICATIONS: Current Outpatient Medications on File Prior to Visit  Medication Sig Dispense Refill  . ALPRAZolam (XANAX) 0.5 MG tablet Take 0.5 mg by mouth 2 (two) times daily as needed. For anxiety    . aspirin EC 81 MG tablet Take 81 mg by mouth.    . dexlansoprazole (DEXILANT) 60 MG capsule Take 60 mg by mouth daily.    . fenofibrate (TRICOR) 145 MG tablet Take 145 mg by mouth daily.  1  . ibuprofen (ADVIL,MOTRIN) 800 MG tablet Take 800 mg by mouth every 8 (eight) hours as needed  for pain.    Marland Kitchen lamoTRIgine (LAMICTAL) 200 MG tablet Take 1 tablet (200 mg total) by mouth daily. 90 tablet 0  . lurasidone (LATUDA) 80 MG TABS tablet Take 1 tablet (80 mg total) by mouth daily with breakfast. 90 tablet 0  . metoprolol succinate (TOPROL-XL) 25 MG 24 hr tablet Take 25 mg by mouth daily.     . pantoprazole (PROTONIX) 40 MG tablet Take 40 mg by mouth daily.    . pravastatin (PRAVACHOL) 40 MG tablet Take 40 mg by mouth every evening.    . traZODone (DESYREL) 50 MG tablet TAKE ONE TABLET (50 MG) AT BEDTIME, MAY TAKE ONE MORE IF NEEDED 180 tablet 0  . Vilazodone HCl (VIIBRYD) 20 MG TABS Take 1 tablet (20 mg total) by mouth daily. 30 tablet 1  . vitamin C (ASCORBIC ACID) 500 MG tablet Take 500 mg by mouth daily.    . Vitamin D, Ergocalciferol, (DRISDOL) 50000 units CAPS capsule Take 1 capsule (50,000 Units total) by mouth every 7 (seven) days. 4 capsule 0   No current facility-administered medications on file prior to visit.     PAST MEDICAL HISTORY: Past Medical History:  Diagnosis Date  . Anxiety   . Bipolar disorder ([place])   . Complication of anesthesia   . Depression   . GERD (gastroesophageal reflux disease)   . Heart murmur    echo- 08/30/15-epic   . History of hiatal hernia   .  HLD (hyperlipidemia)   . HTN (hypertension)   . Infertility, female   . Menopause   . PONV (postoperative nausea and vomiting)     PAST SURGICAL HISTORY: Past Surgical History:  Procedure Laterality Date  . CESAREAN SECTION    . CHOLECYSTECTOMY N/A 10/05/2017   Procedure: LAPAROSCOPIC CHOLECYSTECTOMY WITH INTRAOPERATIVE CHOLANGIOGRAM;  Surgeon: Alphonsa Overall, MD;  Location: [facility];  Service: General;  Laterality: N/A;  . LAPAROSCOPY      SOCIAL HISTORY: Social History   Tobacco Use  . Smoking status: Never Smoker  . Smokeless tobacco: Never Used  Substance Use Topics  . Alcohol use: Yes    Alcohol/week: 3.0 - 3.6 oz    Types: 5 - 6 Cans of beer per week    Comment: occas   .  Drug use: No    FAMILY HISTORY: Family History  Problem Relation Age of Onset  . Heart disease Mother   . Depression Mother   . Hyperlipidemia Father   . Hypertension Father   . Heart disease Father   . Cancer Father   . Depression Father   . Anxiety disorder Father   . Sleep apnea Father     ROS: Review of Systems  Constitutional: Positive for malaise/fatigue. Negative for weight loss.  Gastrointestinal: Negative for nausea and vomiting.  Genitourinary: Negative for frequency.  Musculoskeletal:       Negative for muscle weakness  Endo/Heme/Allergies: Negative for polydipsia.    PHYSICAL EXAM: Blood pressure 126/78, pulse 85, temperature 98.3 F (36.8 C), temperature source Oral, height 5\' 4"  (1.626 m), weight 195 lb (88.5 kg), SpO2 97 %. Body mass index is 33.47 kg/m. Physical Exam  Constitutional: She is oriented to person, place, and time. She appears well-developed and well-nourished.  Cardiovascular: Normal rate.  Pulmonary/Chest: Effort normal.  Musculoskeletal: Normal range of motion.  Neurological: She is oriented to person, place, and time.  Skin: Skin is warm and dry.  Psychiatric: She has a normal mood and affect. Her behavior is normal.  Vitals reviewed.   RECENT LABS AND TESTS: BMET    Component Value Date/Time   NA 139 10/04/2017 1137   NA 142 07/12/2017 1055   K 4.1 10/04/2017 1137   CL 106 10/04/2017 1137   CO2 25 10/04/2017 1137   GLUCOSE 121 (H) 10/04/2017 1137   BUN 9 10/04/2017 1137   BUN 9 07/12/2017 1055   CREATININE 0.74 10/04/2017 1137   CALCIUM 9.3 10/04/2017 1137   GFRNONAA >60 10/04/2017 1137   GFRAA >60 10/04/2017 1137   Lab Results  Component Value Date   HGBA1C 5.4 07/12/2017   Lab Results  Component Value Date   INSULIN 20.6 07/12/2017   CBC    Component Value Date/Time   WBC 7.2 10/04/2017 1137   RBC 3.90 10/04/2017 1137   HGB 12.7 10/04/2017 1137   HGB 13.1 07/12/2017 1055   HCT 38.3 10/04/2017 1137   HCT  40.1 07/12/2017 1055   PLT 237 10/04/2017 1137   MCV 98.2 10/04/2017 1137   MCV 99 (H) 07/12/2017 1055   MCH 32.6 10/04/2017 1137   MCHC 33.2 10/04/2017 1137   RDW 13.1 10/04/2017 1137   RDW 13.1 07/12/2017 1055   LYMPHSABS 1.2 10/04/2017 1137   LYMPHSABS 1.5 07/12/2017 1055   MONOABS 0.6 10/04/2017 1137   EOSABS 0.1 10/04/2017 1137   EOSABS 0.1 07/12/2017 1055   BASOSABS 0.0 10/04/2017 1137   BASOSABS 0.0 07/12/2017 1055   Iron/TIBC/Ferritin/ %Sat No results found for:  IRON, TIBC, FERRITIN, IRONPCTSAT Lipid Panel     Component Value Date/Time   CHOL 225 (H) 07/12/2017 1055   TRIG 105 07/12/2017 1055   HDL 53 07/12/2017 1055   LDLCALC 151 (H) 07/12/2017 1055   Hepatic Function Panel     Component Value Date/Time   PROT 7.7 10/04/2017 1137   PROT 7.3 07/12/2017 1055   ALBUMIN 4.0 10/04/2017 1137   ALBUMIN 4.8 07/12/2017 1055   AST 63 (H) 10/04/2017 1137   ALT 77 (H) 10/04/2017 1137   ALKPHOS 111 10/04/2017 1137   BILITOT 0.8 10/04/2017 1137   BILITOT 0.2 07/12/2017 1055      Component Value Date/Time   TSH 2.420 07/12/2017 1055   Results for Lapaglia, SHIRL WEIR (MRN [id number]) as of 04/25/2018 15:38  Ref. Range 07/12/2017 10:55  Vitamin D, 25-Hydroxy Latest Ref Range: 30.0 - 100.0 ng/mL 25.9 (L)   ASSESSMENT AND PLAN: Insulin resistance - Plan: metFORMIN (GLUCOPHAGE) 500 MG tablet  Vitamin D deficiency  At risk for diabetes mellitus  Class 1 obesity with serious comorbidity and body mass index (BMI) of 33.0 to 33.9 in adult, unspecified obesity type  PLAN:  Insulin Resistance Evelyna will continue to work on weight loss, exercise, and decreasing simple carbohydrates in her diet to help decrease the risk of diabetes. We dicussed metformin including benefits and risks. She was informed that eating too many simple carbohydrates or too many calories at one sitting increases the likelihood of GI side effects. Anina agreed to start metformin 500 mg qAM #30 with no refills  and follow up with [name] as directed to monitor her progress.  Diabetes risk counseling Terena was given extended (15 minutes) diabetes prevention counseling today. She is 57 y.o. female and has risk factors for diabetes including obesity and insulin resistance. We discussed intensive lifestyle modifications today with an emphasis on weight loss as well as increasing exercise and decreasing simple carbohydrates in her diet.  Vitamin D Deficiency Dola was informed that low vitamin D levels contributes to fatigue and are associated with obesity, breast, and colon cancer. She agrees to continue to take prescription Vit D @50 ,000 IU every week (no refill needed) and will follow up for routine testing of vitamin D, at least 2-3 times per year. She was informed of the risk of over-replacement of vitamin D and agrees to not increase her dose unless she discusses this with [name] first.  Obesity Lynnsey is currently in the action stage of change. As such, her goal is to continue with weight loss efforts She has agreed to follow the Category 2 plan +100 calories Camora has been instructed to work up to a goal of 150 minutes of combined cardio and strengthening exercise per week for weight loss and overall health benefits. We discussed the following Behavioral Modification Strategies today: increasing lean protein intake, decreasing simple carbohydrates , increasing vegetables, work on meal planning and easy cooking plans and avoiding temptations  Tarika has agreed to follow up with our clinic in 2 weeks. She was informed of the importance of frequent follow up visits to maximize her success with intensive lifestyle modifications for her multiple health conditions.   OBESITY BEHAVIORAL INTERVENTION VISIT  Today's visit was # 16 out of 22.  Starting weight: 206 lbs Starting date: 07/12/17 Today's weight : 195 lbs Today's date: 04/24/18 Total lbs lost to date: 11    ASK: We discussed the diagnosis of obesity with  Vilinda Blanks HBZJIRC today and Danniell agreed to give [name]  permission to discuss obesity behavioral modification therapy today.  ASSESS: Tarryn has the diagnosis of obesity and her BMI today is 33.46 Sylvi is in the action stage of change   ADVISE: Zaidee was educated on the multiple health risks of obesity as well as the benefit of weight loss to improve her health. She was advised of the need for long term treatment and the importance of lifestyle modifications.  AGREE: Multiple dietary modification options and treatment options were discussed and  Havanna agreed to the above obesity treatment plan.  I, Doreene Nest, am acting as transcriptionist for Eber Jones, MD  I have reviewed the above documentation for accuracy and completeness, and I agree with the above. - Ilene Qua, MD

## 2018-04-29 ENCOUNTER — Ambulatory Visit (HOSPITAL_COMMUNITY): Payer: Medicare Other | Admitting: Psychiatry

## 2018-04-29 DIAGNOSIS — F3131 Bipolar disorder, current episode depressed, mild: Secondary | ICD-10-CM

## 2018-05-06 NOTE — Progress Notes (Signed)
   THERAPIST PROGRESS NOTE  Session Time:3:05-4:00  Participation Level:Active  Behavioral Response:CasualAlertActiveStressed  Type of Therapy: Individual Therapy  Treatment Goals addressed: Anxiety; emotion regulation  Interventions:Strength-based and Supportive  Summary: Sherry Strong RJGYLUDAP a 57y.o.femalewho presents with anxiety; insomnia.   Suicidal/Homicidal:Nowithout intent/plan  Therapist Response: Pt.presentas alert, talkative, energetic, engaged in the therapeutic process. Pt. Continues to present with stress associated with worry about caregiving for her mother and finances. Pt. Discussed decisions that she believes herself to be responsible for regarding her mother's care. Pt. Expressed concern about decision as to whether or not place her mother in nursing home care, and if so how Pt. Will fare financially as the salary that she receives from her mother is her primary source of income. Pt. Continues to focus on her self-care with diet and exercise. Pt. Discussed that she has isolated herself from close friendships due to stress and not wanting to feel that she is a burden on her friends who have problems of their own. Time was spent in session processing belief that Pt. Is a burden to her friends and education about the necessity of social support and connection to friends. Pt. Processed difficult family interaction regarding wedding that she was not invited to and ongoing contention within her family and fears that her family will judge her regarding the condition of her mother's home. Counselor used the grounding belief 'what do you know to be true about yourself today?' Pt. Was able to answer that she knows to be true that she works very hard for her mother and does the very best that she can. Pt. Was encouraged to bring her attention to this thought when the thoughts related to the judgment of others come to her mind.   Plan: Return again in 2-3weeks. Continue  with CBT based therapy.  Diagnosis:Generalized Anxiety Disorder; Bipolar   [name], [facility] 05/06/2018

## 2018-05-08 ENCOUNTER — Ambulatory Visit (INDEPENDENT_AMBULATORY_CARE_PROVIDER_SITE_OTHER): Payer: Medicare Other | Admitting: Family Medicine

## 2018-05-08 VITALS — BP 125/85 | HR 68 | Temp 97.7°F | Ht 64.0 in | Wt 196.0 lb

## 2018-05-08 DIAGNOSIS — E8881 Metabolic syndrome: Secondary | ICD-10-CM | POA: Diagnosis not present

## 2018-05-08 DIAGNOSIS — Z6833 Body mass index (BMI) 33.0-33.9, adult: Secondary | ICD-10-CM | POA: Diagnosis not present

## 2018-05-08 DIAGNOSIS — E559 Vitamin D deficiency, unspecified: Secondary | ICD-10-CM

## 2018-05-08 DIAGNOSIS — E669 Obesity, unspecified: Secondary | ICD-10-CM

## 2018-05-08 DIAGNOSIS — E7849 Other hyperlipidemia: Secondary | ICD-10-CM | POA: Diagnosis not present

## 2018-05-08 MED ORDER — METFORMIN HCL 500 MG PO TABS: 500.0000 mg | ORAL_TABLET | Freq: Every day | ORAL | 0 refills | Status: DC

## 2018-05-08 MED ORDER — VITAMIN D (ERGOCALCIFEROL) 1.25 MG (50000 UNIT) PO CAPS: 50000.0000 [IU] | ORAL_CAPSULE | ORAL | 0 refills | Status: DC

## 2018-05-08 NOTE — Progress Notes (Signed)
Office: [phone]  /  Fax: [phone]   HPI:   Chief Complaint: OBESITY Sherry Strong is here to discuss her progress with her obesity treatment plan. She is on the Category 2 plan +100 calories and is following her eating plan approximately 90 % of the time. She states she is moving more. Sherry Strong is working on weight loss and has been journaling on and off, but she is frustrated that she struggles so much for minimal results. Her weight is 196 lb (88.9 kg) today and has had a weight loss of 1 pound over a period of 2 weeks since her last visit. She has lost 10 lbs since starting treatment with [name].  Insulin Resistance Sherry Strong has a diagnosis of insulin resistance based on her elevated fasting insulin level >5. Although Sherry Strong's blood glucose readings are still under good control, insulin resistance puts her at greater risk of metabolic syndrome and diabetes. Sherry Strong is due for labs. She is stable on metformin and she is struggling with diet. Sherry Strong continues to work on diet and exercise to decrease risk of diabetes. Sherry Strong denies nausea, vomiting or hypoglycemia.  Vitamin D deficiency Sherry Strong has a diagnosis of vitamin D deficiency. She is stable on vit D and denies nausea, vomiting or muscle weakness.  Hyperlipidemia Sherry Strong has hyperlipidemia and has been attempting to improve her cholesterol levels with intensive lifestyle modification including a low saturated fat diet, exercise and weight loss. She denies any chest pain. Sherry Strong is due for labs..  ALLERGIES: Allergies  Allergen Reactions  . Vistaril  [Hydroxyzine Hcl] Swelling    MEDICATIONS: Current Outpatient Medications on File Prior to Visit  Medication Sig Dispense Refill  . ALPRAZolam (XANAX) 0.5 MG tablet Take 0.5 mg by mouth 2 (two) times daily as needed. For anxiety    . aspirin EC 81 MG tablet Take 81 mg by mouth.    . dexlansoprazole (DEXILANT) 60 MG capsule Take 60 mg by mouth daily.    . fenofibrate (TRICOR) 145 MG tablet Take 145 mg by  mouth daily.  1  . ibuprofen (ADVIL,MOTRIN) 800 MG tablet Take 800 mg by mouth every 8 (eight) hours as needed for pain.    Marland Kitchen lamoTRIgine (LAMICTAL) 200 MG tablet Take 1 tablet (200 mg total) by mouth daily. 90 tablet 0  . lurasidone (LATUDA) 80 MG TABS tablet Take 1 tablet (80 mg total) by mouth daily with breakfast. 90 tablet 0  . metFORMIN (GLUCOPHAGE) 500 MG tablet Take 1 tablet (500 mg total) by mouth daily with breakfast. 30 tablet 0  . metoprolol succinate (TOPROL-XL) 25 MG 24 hr tablet Take 25 mg by mouth daily.     . pantoprazole (PROTONIX) 40 MG tablet Take 40 mg by mouth daily.    . pravastatin (PRAVACHOL) 40 MG tablet Take 40 mg by mouth every evening.    . traZODone (DESYREL) 50 MG tablet TAKE ONE TABLET (50 MG) AT BEDTIME, MAY TAKE ONE MORE IF NEEDED 180 tablet 0  . Vilazodone HCl (VIIBRYD) 20 MG TABS Take 1 tablet (20 mg total) by mouth daily. 30 tablet 1  . vitamin C (ASCORBIC ACID) 500 MG tablet Take 500 mg by mouth daily.    . Vitamin D, Ergocalciferol, (DRISDOL) 50000 units CAPS capsule Take 1 capsule (50,000 Units total) by mouth every 7 (seven) days. 4 capsule 0   No current facility-administered medications on file prior to visit.     PAST MEDICAL HISTORY: Past Medical History:  Diagnosis Date  . Anxiety   .  Bipolar disorder ([place])   . Complication of anesthesia   . Depression   . GERD (gastroesophageal reflux disease)   . Heart murmur    echo- 08/30/15-epic   . History of hiatal hernia   . HLD (hyperlipidemia)   . HTN (hypertension)   . Infertility, female   . Menopause   . PONV (postoperative nausea and vomiting)     PAST SURGICAL HISTORY: Past Surgical History:  Procedure Laterality Date  . CESAREAN SECTION    . CHOLECYSTECTOMY N/A 10/05/2017   Procedure: LAPAROSCOPIC CHOLECYSTECTOMY WITH INTRAOPERATIVE CHOLANGIOGRAM;  Surgeon: Alphonsa Overall, MD;  Location: [facility];  Service: General;  Laterality: N/A;  . LAPAROSCOPY      SOCIAL HISTORY: Social History    Tobacco Use  . Smoking status: Never Smoker  . Smokeless tobacco: Never Used  Substance Use Topics  . Alcohol use: Yes    Alcohol/week: 5.0 - 6.0 standard drinks    Types: 5 - 6 Cans of beer per week    Comment: occas   . Drug use: No    FAMILY HISTORY: Family History  Problem Relation Age of Onset  . Heart disease Mother   . Depression Mother   . Hyperlipidemia Father   . Hypertension Father   . Heart disease Father   . Cancer Father   . Depression Father   . Anxiety disorder Father   . Sleep apnea Father     ROS: Review of Systems  Constitutional: Negative for weight loss.  Cardiovascular: Negative for chest pain.  Gastrointestinal: Negative for nausea and vomiting.  Musculoskeletal:       Negative for muscle weakness  Endo/Heme/Allergies:       Negative for hypoglycemia    PHYSICAL EXAM: Blood pressure 125/85, pulse 68, temperature 97.7 F (36.5 C), temperature source Oral, height 5\' 4"  (1.626 m), weight 196 lb (88.9 kg), SpO2 100 %. Body mass index is 33.64 kg/m. Physical Exam  Constitutional: She is oriented to person, place, and time. She appears well-developed and well-nourished.  Cardiovascular: Normal rate.  Pulmonary/Chest: Effort normal.  Musculoskeletal: Normal range of motion.  Neurological: She is oriented to person, place, and time.  Skin: Skin is warm and dry.  Psychiatric: She has a normal mood and affect. Her behavior is normal.  Vitals reviewed.   RECENT LABS AND TESTS: BMET    Component Value Date/Time   NA 139 10/04/2017 1137   NA 142 07/12/2017 1055   K 4.1 10/04/2017 1137   CL 106 10/04/2017 1137   CO2 25 10/04/2017 1137   GLUCOSE 121 (H) 10/04/2017 1137   BUN 9 10/04/2017 1137   BUN 9 07/12/2017 1055   CREATININE 0.74 10/04/2017 1137   CALCIUM 9.3 10/04/2017 1137   GFRNONAA >60 10/04/2017 1137   GFRAA >60 10/04/2017 1137   Lab Results  Component Value Date   HGBA1C 5.4 07/12/2017   Lab Results  Component Value Date     INSULIN 20.6 07/12/2017   CBC    Component Value Date/Time   WBC 7.2 10/04/2017 1137   RBC 3.90 10/04/2017 1137   HGB 12.7 10/04/2017 1137   HGB 13.1 07/12/2017 1055   HCT 38.3 10/04/2017 1137   HCT 40.1 07/12/2017 1055   PLT 237 10/04/2017 1137   MCV 98.2 10/04/2017 1137   MCV 99 (H) 07/12/2017 1055   MCH 32.6 10/04/2017 1137   MCHC 33.2 10/04/2017 1137   RDW 13.1 10/04/2017 1137   RDW 13.1 07/12/2017 1055   LYMPHSABS 1.2  10/04/2017 1137   LYMPHSABS 1.5 07/12/2017 1055   MONOABS 0.6 10/04/2017 1137   EOSABS 0.1 10/04/2017 1137   EOSABS 0.1 07/12/2017 1055   BASOSABS 0.0 10/04/2017 1137   BASOSABS 0.0 07/12/2017 1055   Iron/TIBC/Ferritin/ %Sat No results found for: IRON, TIBC, FERRITIN, IRONPCTSAT Lipid Panel     Component Value Date/Time   CHOL 225 (H) 07/12/2017 1055   TRIG 105 07/12/2017 1055   HDL 53 07/12/2017 1055   LDLCALC 151 (H) 07/12/2017 1055   Hepatic Function Panel     Component Value Date/Time   PROT 7.7 10/04/2017 1137   PROT 7.3 07/12/2017 1055   ALBUMIN 4.0 10/04/2017 1137   ALBUMIN 4.8 07/12/2017 1055   AST 63 (H) 10/04/2017 1137   ALT 77 (H) 10/04/2017 1137   ALKPHOS 111 10/04/2017 1137   BILITOT 0.8 10/04/2017 1137   BILITOT 0.2 07/12/2017 1055      Component Value Date/Time   TSH 2.420 07/12/2017 1055   Results for Hereford, KA BENCH (MRN [id number]) as of 05/08/2018 13:59  Ref. Range 07/12/2017 10:55  Vitamin D, 25-Hydroxy Latest Ref Range: 30.0 - 100.0 ng/mL 25.9 (L)   ASSESSMENT AND PLAN: Insulin resistance - Plan: Comprehensive metabolic panel, Hemoglobin A1c, Insulin, random, metFORMIN (GLUCOPHAGE) 500 MG tablet  Vitamin D deficiency - Plan: VITAMIN D 25 Hydroxy (Vit-D Deficiency, Fractures), Vitamin D, Ergocalciferol, (DRISDOL) 50000 units CAPS capsule  Other hyperlipidemia - Plan: Lipid Panel With LDL/HDL Ratio  Class 1 obesity with serious comorbidity and body mass index (BMI) of 33.0 to 33.9 in adult, unspecified obesity  type  PLAN:  Insulin Resistance Sherry Strong will continue to work on weight loss, exercise, and decreasing simple carbohydrates in her diet to help decrease the risk of diabetes. We dicussed metformin including benefits and risks. She was informed that eating too many simple carbohydrates or too many calories at one sitting increases the likelihood of GI side effects. Sherry Strong requested metformin for now and prescription was written today for 1 month refill. We will check labs and Sherry Strong agreed to follow up with [name] as directed to monitor her progress.  Vitamin D Deficiency Sherry Strong was informed that low vitamin D levels contributes to fatigue and are associated with obesity, breast, and colon cancer. She agrees to continue to take prescription Vit D @50 ,000 IU every week #4 with no refills and will follow up for routine testing of vitamin D, at least 2-3 times per year. She was informed of the risk of over-replacement of vitamin D and agrees to not increase her dose unless she discusses this with [name] first. We will check labs and Sherry Strong agrees to follow up as directed.  Hyperlipidemia Sherry Strong was informed of the American Heart Association Guidelines emphasizing intensive lifestyle modifications as the first line treatment for hyperlipidemia. We discussed many lifestyle modifications today in depth, and Sherry Strong will continue to work on decreasing saturated fats such as fatty red meat, butter and many fried foods. She will also increase vegetables and lean protein in her diet and continue to work on exercise and weight loss efforts. We will check labs and Sherry Strong will follow up as directed.  Obesity Sherry Strong is currently in the action stage of change. As such, her goal is to continue with weight loss efforts She has agreed to keep a food journal with 1300 to 1500 calories and 80+ grams of protein daily Sherry Strong has been instructed to work up to a goal of 150 minutes of combined cardio and strengthening exercise per week  for weight loss  and overall health benefits. We discussed the following Behavioral Modification Strategies today: keep a strict food journal, increasing lean protein intake and decreasing simple carbohydrates   We spent a significant amount of time discussing journaling, options, macronutrient's, menopause and metabolism. Sherry Strong agreed to change to journaling.  Sherry Strong has agreed to follow up with our clinic in 2 weeks. She was informed of the importance of frequent follow up visits to maximize her success with intensive lifestyle modifications for her multiple health conditions.   OBESITY BEHAVIORAL INTERVENTION VISIT  Today's visit was # 17 out of 22.  Starting weight: 206 lbs Starting date: 07/12/17 Today's weight : 196 lbs  Today's date: 05/08/2018 Total lbs lost to date: 10    ASK: We discussed the diagnosis of obesity with Anesa Fronek [id number] today and Sathvika agreed to give [name] permission to discuss obesity behavioral modification therapy today.  ASSESS: Nasiyah has the diagnosis of obesity and her BMI today is 33.63 Janayia is in the action stage of change   ADVISE: Sumayya was educated on the multiple health risks of obesity as well as the benefit of weight loss to improve her health. She was advised of the need for long term treatment and the importance of lifestyle modifications.  AGREE: Multiple dietary modification options and treatment options were discussed and  [place] agreed to the above obesity treatment plan.  I, Doreene Nest, am acting as transcriptionist for Dennard Nip, MD  I have reviewed the above documentation for accuracy and completeness, and I agree with the above. -Dennard Nip, MD

## 2018-05-09 LAB — COMPREHENSIVE METABOLIC PANEL
ALT: 28 IU/L (ref 0–32)
AST: 23 IU/L (ref 0–40)
Albumin/Globulin Ratio: 1.9 (ref 1.2–2.2)
Albumin: 4.7 g/dL (ref 3.5–5.5)
Alkaline Phosphatase: 65 IU/L (ref 39–117)
BUN/Creatinine Ratio: 14 (ref 9–23)
BUN: 10 mg/dL (ref 6–24)
Bilirubin Total: 0.3 mg/dL (ref 0.0–1.2)
CO2: 23 mmol/L (ref 20–29)
Calcium: 9.3 mg/dL (ref 8.7–10.2)
Chloride: 104 mmol/L (ref 96–106)
Creatinine, Ser: 0.72 mg/dL (ref 0.57–1.00)
GFR calc Af Amer: 108 mL/min/{1.73_m2} (ref >59)
GFR calc non Af Amer: 93 mL/min/{1.73_m2} (ref >59)
Globulin, Total: 2.5 g/dL (ref 1.5–4.5)
Glucose: 88 mg/dL (ref 65–99)
Potassium: 4.4 mmol/L (ref 3.5–5.2)
Sodium: 144 mmol/L (ref 134–144)
Total Protein: 7.2 g/dL (ref 6.0–8.5)

## 2018-05-09 LAB — LIPID PANEL WITH LDL/HDL RATIO
Cholesterol, Total: 220 mg/dL — ABNORMAL HIGH (ref 100–199)
HDL: 51 mg/dL (ref >39)
LDL Calculated: 143 mg/dL — ABNORMAL HIGH (ref 0–99)
LDl/HDL Ratio: 2.8 ratio (ref 0.0–3.2)
Triglycerides: 131 mg/dL (ref 0–149)
VLDL Cholesterol Cal: 26 mg/dL (ref 5–40)

## 2018-05-09 LAB — HEMOGLOBIN A1C
Est. average glucose Bld gHb Est-mCnc: 111 mg/dL
Hgb A1c MFr Bld: 5.5 % (ref 4.8–5.6)

## 2018-05-09 LAB — INSULIN, RANDOM: INSULIN: 8.8 u[IU]/mL (ref 2.6–24.9)

## 2018-05-09 LAB — VITAMIN D 25 HYDROXY (VIT D DEFICIENCY, FRACTURES): Vit D, 25-Hydroxy: 29.4 ng/mL — ABNORMAL LOW (ref 30.0–100.0)

## 2018-05-10 ENCOUNTER — Other Ambulatory Visit (HOSPITAL_COMMUNITY): Payer: Self-pay

## 2018-05-10 DIAGNOSIS — F3131 Bipolar disorder, current episode depressed, mild: Secondary | ICD-10-CM

## 2018-05-10 MED ORDER — VILAZODONE HCL 20 MG PO TABS: 20.0000 mg | ORAL_TABLET | Freq: Every day | ORAL | 0 refills | Status: DC

## 2018-05-22 ENCOUNTER — Ambulatory Visit (INDEPENDENT_AMBULATORY_CARE_PROVIDER_SITE_OTHER): Payer: Medicare Other | Admitting: Bariatrics

## 2018-05-29 ENCOUNTER — Ambulatory Visit (INDEPENDENT_AMBULATORY_CARE_PROVIDER_SITE_OTHER): Payer: Medicare Other | Admitting: Bariatrics

## 2018-05-29 ENCOUNTER — Encounter (INDEPENDENT_AMBULATORY_CARE_PROVIDER_SITE_OTHER): Payer: Self-pay | Admitting: Bariatrics

## 2018-05-29 VITALS — BP 119/75 | HR 79 | Temp 97.9°F | Ht 64.0 in | Wt 198.0 lb

## 2018-05-29 DIAGNOSIS — E669 Obesity, unspecified: Secondary | ICD-10-CM

## 2018-05-29 DIAGNOSIS — E88819 Insulin resistance, unspecified: Secondary | ICD-10-CM

## 2018-05-29 DIAGNOSIS — Z6834 Body mass index (BMI) 34.0-34.9, adult: Secondary | ICD-10-CM

## 2018-05-29 DIAGNOSIS — E559 Vitamin D deficiency, unspecified: Secondary | ICD-10-CM

## 2018-05-29 DIAGNOSIS — E8881 Metabolic syndrome: Secondary | ICD-10-CM

## 2018-05-29 MED ORDER — VITAMIN D (ERGOCALCIFEROL) 1.25 MG (50000 UNIT) PO CAPS: 50000.0000 [IU] | ORAL_CAPSULE | ORAL | 0 refills | Status: DC

## 2018-05-29 NOTE — Progress Notes (Signed)
Office: [phone]  /  Fax: [phone]   HPI:   Chief Complaint: OBESITY Sherry Strong is here to discuss her progress with her obesity treatment plan. She is on the keep a food journal with 1300 to 1500 calories and 80+ grams of protein daily and is following her eating plan approximately 50 % of the time. She states she is exercising 0 minutes 0 times per week. [name] increased stress eating and emotional eating in the evening (craving started). She is getting enough protein most days and hunger is controlled. Her weight is 198 lb (89.8 kg) today and has not lost weight since her last visit. She has lost 8 lbs since starting treatment with [name].  Insulin Resistance [name] has a diagnosis of insulin resistance based on her elevated fasting insulin level >5. Although [name]'s blood glucose readings are still under good control, insulin resistance puts her at greater risk of metabolic syndrome and diabetes. She is taking metformin currently with no side effects. [name] continues to work on diet and exercise to decrease risk of diabetes. She denies polyphagia or polydipsia.  Vitamin D deficiency [name] has a diagnosis of vitamin D deficiency. She is currently taking high dose vit D and denies nausea, vomiting or muscle weakness.  ALLERGIES: Allergies  Allergen Reactions  . Vistaril  [Hydroxyzine Hcl] Swelling    MEDICATIONS: Current Outpatient Medications on File Prior to Visit  Medication Sig Dispense Refill  . ALPRAZolam (XANAX) 0.5 MG tablet Take 0.5 mg by mouth 2 (two) times daily as needed. For anxiety    . aspirin EC 81 MG tablet Take 81 mg by mouth.    . dexlansoprazole (DEXILANT) 60 MG capsule Take 60 mg by mouth daily.    . fenofibrate (TRICOR) 145 MG tablet Take 145 mg by mouth daily.  1  . ibuprofen (ADVIL,MOTRIN) 800 MG tablet Take 800 mg by mouth every 8 (eight) hours as needed for pain.    Marland Kitchen lamoTRIgine (LAMICTAL) 200 MG tablet Take 1 tablet (200 mg total) by mouth daily. 90 tablet 0  .  lurasidone (LATUDA) 80 MG TABS tablet Take 1 tablet (80 mg total) by mouth daily with breakfast. 90 tablet 0  . metFORMIN (GLUCOPHAGE) 500 MG tablet Take 1 tablet (500 mg total) by mouth daily with breakfast. 30 tablet 0  . metoprolol succinate (TOPROL-XL) 25 MG 24 hr tablet Take 25 mg by mouth daily.     . pantoprazole (PROTONIX) 40 MG tablet Take 40 mg by mouth daily.    . pravastatin (PRAVACHOL) 40 MG tablet Take 40 mg by mouth every evening.    . traZODone (DESYREL) 50 MG tablet TAKE ONE TABLET (50 MG) AT BEDTIME, MAY TAKE ONE MORE IF NEEDED 180 tablet 0  . Vilazodone HCl (VIIBRYD) 20 MG TABS Take 1 tablet (20 mg total) by mouth daily. 30 tablet 0  . vitamin C (ASCORBIC ACID) 500 MG tablet Take 500 mg by mouth daily.    . Vitamin D, Ergocalciferol, (DRISDOL) 50000 units CAPS capsule Take 1 capsule (50,000 Units total) by mouth every 7 (seven) days. 4 capsule 0   No current facility-administered medications on file prior to visit.     PAST MEDICAL HISTORY: Past Medical History:  Diagnosis Date  . Anxiety   . Bipolar disorder ([place])   . Complication of anesthesia   . Depression   . GERD (gastroesophageal reflux disease)   . Heart murmur    echo- 08/30/15-epic   . History of hiatal hernia   .  HLD (hyperlipidemia)   . HTN (hypertension)   . Infertility, female   . Menopause   . PONV (postoperative nausea and vomiting)     PAST SURGICAL HISTORY: Past Surgical History:  Procedure Laterality Date  . CESAREAN SECTION    . CHOLECYSTECTOMY N/A 10/05/2017   Procedure: LAPAROSCOPIC CHOLECYSTECTOMY WITH INTRAOPERATIVE CHOLANGIOGRAM;  Surgeon: Alphonsa Overall, MD;  Location: [facility];  Service: General;  Laterality: N/A;  . LAPAROSCOPY      SOCIAL HISTORY: Social History   Tobacco Use  . Smoking status: Never Smoker  . Smokeless tobacco: Never Used  Substance Use Topics  . Alcohol use: Yes    Alcohol/week: 5.0 - 6.0 standard drinks    Types: 5 - 6 Cans of beer per week    Comment:  occas   . Drug use: No    FAMILY HISTORY: Family History  Problem Relation Age of Onset  . Heart disease Mother   . Depression Mother   . Hyperlipidemia Father   . Hypertension Father   . Heart disease Father   . Cancer Father   . Depression Father   . Anxiety disorder Father   . Sleep apnea Father     ROS: Review of Systems  Constitutional: Negative for weight loss.  Gastrointestinal: Negative for diarrhea, nausea and vomiting.  Musculoskeletal:       Negative for muscle weakness  Endo/Heme/Allergies: Negative for polydipsia.       Positive for cravings Negative for polyphagia    PHYSICAL EXAM: Blood pressure 119/75, pulse 79, temperature 97.9 F (36.6 C), temperature source Oral, height 5\' 4"  (1.626 m), weight 198 lb (89.8 kg), SpO2 98 %. Body mass index is 33.99 kg/m. Physical Exam  Constitutional: She is oriented to person, place, and time. She appears well-developed and well-nourished.  Cardiovascular: Normal rate.  Pulmonary/Chest: Effort normal.  Musculoskeletal: Normal range of motion.  Neurological: She is oriented to person, place, and time.  Skin: Skin is warm and dry.  Psychiatric: She has a normal mood and affect. Her behavior is normal.  Vitals reviewed.   RECENT LABS AND TESTS: BMET    Component Value Date/Time   NA 144 05/08/2018 1246   K 4.4 05/08/2018 1246   CL 104 05/08/2018 1246   CO2 23 05/08/2018 1246   GLUCOSE 88 05/08/2018 1246   GLUCOSE 121 (H) 10/04/2017 1137   BUN 10 05/08/2018 1246   CREATININE 0.72 05/08/2018 1246   CALCIUM 9.3 05/08/2018 1246   GFRNONAA 93 05/08/2018 1246   GFRAA 108 05/08/2018 1246   Lab Results  Component Value Date   HGBA1C 5.5 05/08/2018   HGBA1C 5.4 07/12/2017   Lab Results  Component Value Date   INSULIN 8.8 05/08/2018   INSULIN 20.6 07/12/2017   CBC    Component Value Date/Time   WBC 7.2 10/04/2017 1137   RBC 3.90 10/04/2017 1137   HGB 12.7 10/04/2017 1137   HGB 13.1 07/12/2017 1055    HCT 38.3 10/04/2017 1137   HCT 40.1 07/12/2017 1055   PLT 237 10/04/2017 1137   MCV 98.2 10/04/2017 1137   MCV 99 (H) 07/12/2017 1055   MCH 32.6 10/04/2017 1137   MCHC 33.2 10/04/2017 1137   RDW 13.1 10/04/2017 1137   RDW 13.1 07/12/2017 1055   LYMPHSABS 1.2 10/04/2017 1137   LYMPHSABS 1.5 07/12/2017 1055   MONOABS 0.6 10/04/2017 1137   EOSABS 0.1 10/04/2017 1137   EOSABS 0.1 07/12/2017 1055   BASOSABS 0.0 10/04/2017 1137   BASOSABS 0.0  07/12/2017 1055   Iron/TIBC/Ferritin/ %Sat No results found for: IRON, TIBC, FERRITIN, IRONPCTSAT Lipid Panel     Component Value Date/Time   CHOL 220 (H) 05/08/2018 1246   TRIG 131 05/08/2018 1246   HDL 51 05/08/2018 1246   LDLCALC 143 (H) 05/08/2018 1246   Hepatic Function Panel     Component Value Date/Time   PROT 7.2 05/08/2018 1246   ALBUMIN 4.7 05/08/2018 1246   AST 23 05/08/2018 1246   ALT 28 05/08/2018 1246   ALKPHOS 65 05/08/2018 1246   BILITOT 0.3 05/08/2018 1246      Component Value Date/Time   TSH 2.420 07/12/2017 1055   Results for Galbraith, NALEYAH OHLINGER (MRN [id number]) as of 05/29/2018 16:16  Ref. Range 05/08/2018 12:46  Vitamin D, 25-Hydroxy Latest Ref Range: 30.0 - 100.0 ng/mL 29.4 (L)   ASSESSMENT AND PLAN: Vitamin D deficiency - Plan: Vitamin D, Ergocalciferol, (DRISDOL) 50000 units CAPS capsule  Insulin resistance  Class 1 obesity with serious comorbidity and body mass index (BMI) of 34.0 to 34.9 in adult, unspecified obesity type  PLAN:  Insulin Resistance Emon will continue to work on weight loss, exercise, and decreasing simple carbohydrates in her diet to help decrease the risk of diabetes. We dicussed metformin including benefits and risks. She was informed that eating too many simple carbohydrates or too many calories at one sitting increases the likelihood of GI side effects. Myria will continue metformin. She will begin resistance bands and continue going to the gym. Delvina agreed to follow up with [name] as directed to  monitor her progress.  Vitamin D Deficiency Alishah was informed that low vitamin D levels contributes to fatigue and are associated with obesity, breast, and colon cancer. She agrees to continue to take prescription Vit D @50 ,000 IU every week #4 with no refills and will follow up for routine testing of vitamin D, at least 2-3 times per year. She was informed of the risk of over-replacement of vitamin D and agrees to not increase her dose unless she discusses this with [name] first. Helyne will increase vitamin D rich foods and follow up as directed.  I spent > than 50% of the 15 minute visit on counseling as documented in the note.  Obesity Dorrene is currently in the action stage of change. As such, her goal is to continue with weight loss efforts She has agreed to keep a food journal with 1300 to 1500 calories and 80 grams of protein daily Timesha will have a protein bar occasionally. Amyjo has been instructed to work up to a goal of 150 minutes of combined cardio and strengthening exercise per week or go to the gym 3 times per week for 30 to 45 minutes for weight loss and overall health benefits. Samiksha was given resistance bands and "resistance band exercise sheet". We discussed the following Behavioral Modification Strategies today: increase H2O intake, increasing lean protein intake, increasing vegetables and ways to avoid night time snacking  We will send to Dr. Mallie Mussel for stress eating.  Dacie has agreed to follow up with our clinic in 2 weeks. She was informed of the importance of frequent follow up visits to maximize her success with intensive lifestyle modifications for her multiple health conditions.   OBESITY BEHAVIORAL INTERVENTION VISIT  Today's visit was # 18   Starting weight: 206 lbs Starting date: 07/12/17 Today's weight : 198 lbs Today's date: 05/29/2018 Total lbs lost to date: 8   ASK: We discussed the diagnosis of obesity with [name]  M [id number] today and Lakoda agreed to give [name]  permission to discuss obesity behavioral modification therapy today.  ASSESS: Anora has the diagnosis of obesity and her BMI today is 33.97 Alexie is in the action stage of change   ADVISE: Terriyah was educated on the multiple health risks of obesity as well as the benefit of weight loss to improve her health. She was advised of the need for long term treatment and the importance of lifestyle modifications to improve her current health and to decrease her risk of future health problems.  AGREE: Multiple dietary modification options and treatment options were discussed and  Yomira agreed to follow the recommendations documented in the above note.  ARRANGE: Alylah was educated on the importance of frequent visits to treat obesity as outlined per CMS and USPSTF guidelines and agreed to schedule her next follow up appointment today.  Corey Skains, am acting as Location manager for [employer]. Owens Shark, DO  I have reviewed the above documentation for accuracy and completeness, and I agree with the above. -Jearld Lesch, DO

## 2018-06-03 ENCOUNTER — Other Ambulatory Visit (INDEPENDENT_AMBULATORY_CARE_PROVIDER_SITE_OTHER): Payer: Self-pay | Admitting: Family Medicine

## 2018-06-03 DIAGNOSIS — E559 Vitamin D deficiency, unspecified: Secondary | ICD-10-CM

## 2018-06-04 ENCOUNTER — Ambulatory Visit (HOSPITAL_COMMUNITY): Payer: Medicare Other | Admitting: Psychiatry

## 2018-06-05 ENCOUNTER — Encounter (HOSPITAL_COMMUNITY): Payer: Self-pay | Admitting: Psychiatry

## 2018-06-06 ENCOUNTER — Other Ambulatory Visit (HOSPITAL_COMMUNITY): Payer: Self-pay

## 2018-06-06 DIAGNOSIS — F3131 Bipolar disorder, current episode depressed, mild: Secondary | ICD-10-CM

## 2018-06-06 MED ORDER — VILAZODONE HCL 20 MG PO TABS: 20.0000 mg | ORAL_TABLET | Freq: Every day | ORAL | 0 refills | Status: DC
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
## 2018-06-12 ENCOUNTER — Ambulatory Visit (INDEPENDENT_AMBULATORY_CARE_PROVIDER_SITE_OTHER): Payer: Medicare Other | Admitting: Bariatrics

## 2018-06-12 ENCOUNTER — Ambulatory Visit (INDEPENDENT_AMBULATORY_CARE_PROVIDER_SITE_OTHER): Payer: Medicare Other | Admitting: Psychology

## 2018-06-12 ENCOUNTER — Encounter (INDEPENDENT_AMBULATORY_CARE_PROVIDER_SITE_OTHER): Payer: Self-pay | Admitting: Bariatrics

## 2018-06-12 VITALS — BP 130/84 | HR 76 | Temp 98.4°F | Ht 64.0 in | Wt 196.0 lb

## 2018-06-12 DIAGNOSIS — Z6833 Body mass index (BMI) 33.0-33.9, adult: Secondary | ICD-10-CM

## 2018-06-12 DIAGNOSIS — F3181 Bipolar II disorder: Secondary | ICD-10-CM

## 2018-06-12 DIAGNOSIS — Z9189 Other specified personal risk factors, not elsewhere classified: Secondary | ICD-10-CM

## 2018-06-12 DIAGNOSIS — E559 Vitamin D deficiency, unspecified: Secondary | ICD-10-CM | POA: Diagnosis not present

## 2018-06-12 DIAGNOSIS — E8881 Metabolic syndrome: Secondary | ICD-10-CM

## 2018-06-12 DIAGNOSIS — E669 Obesity, unspecified: Secondary | ICD-10-CM | POA: Diagnosis not present

## 2018-06-12 MED ORDER — VITAMIN D (ERGOCALCIFEROL) 1.25 MG (50000 UNIT) PO CAPS: 50000.0000 [IU] | ORAL_CAPSULE | ORAL | 0 refills | Status: DC

## 2018-06-14 ENCOUNTER — Other Ambulatory Visit (HOSPITAL_COMMUNITY): Payer: Self-pay | Admitting: Psychiatry

## 2018-06-14 DIAGNOSIS — F3131 Bipolar disorder, current episode depressed, mild: Secondary | ICD-10-CM

## 2018-06-17 NOTE — Progress Notes (Signed)
Office: [phone]  /  Fax: [phone]   HPI:   Chief Complaint: OBESITY Sherry Strong is here to discuss her progress with her obesity treatment plan. She is on the keep a food journal with 1300-1500 calories and 80 grams of protein daily and is following her eating plan approximately 0 % of the time. She states she is exercising 0 minutes 0 times per week. Sherry Strong is eating 1 meal per day and she is not following the plan. She met with Dr. Mallie Mussel today, and she notes no appetite secondary to stress. She is seeing a Social worker and will increase her visits to once a week.  Her weight is 196 lb (88.9 kg) today and has had a weight loss of 2 pounds over a period of 2 weeks since her last visit. She has lost 10 lbs since starting treatment with [name].  Insulin Resistance Sherry Strong has a diagnosis of insulin resistance based on her elevated fasting insulin level >5. Last A1c was 5.5 and insulin was 8.8, and she denies polyphagia. Although Sherry Strong's blood glucose readings are still under good control, insulin resistance puts her at greater risk of metabolic syndrome and diabetes. She is not taking metformin currently and continues to work on diet and exercise to decrease risk of diabetes.  At risk for diabetes Sherry Strong is at higher than average risk for developing diabetes due to her obesity and insulin resistance. She currently denies polyuria or polydipsia.  Vitamin D Deficiency Sherry Strong has a diagnosis of vitamin D deficiency. She is taking high dose prescription Vit D and denies nausea, vomiting or muscle weakness.  ALLERGIES: Allergies  Allergen Reactions  . Vistaril  [Hydroxyzine Hcl] Swelling    MEDICATIONS: Current Outpatient Medications on File Prior to Visit  Medication Sig Dispense Refill  . ALPRAZolam (XANAX) 0.5 MG tablet Take 0.5 mg by mouth 2 (two) times daily as needed. For anxiety    . aspirin EC 81 MG tablet Take 81 mg by mouth.    . dexlansoprazole (DEXILANT) 60 MG capsule Take 60 mg by mouth  daily.    . fenofibrate (TRICOR) 145 MG tablet Take 145 mg by mouth daily.  1  . ibuprofen (ADVIL,MOTRIN) 800 MG tablet Take 800 mg by mouth every 8 (eight) hours as needed for pain.    Marland Kitchen lamoTRIgine (LAMICTAL) 200 MG tablet Take 1 tablet (200 mg total) by mouth daily. 90 tablet 0  . lurasidone (LATUDA) 80 MG TABS tablet Take 1 tablet (80 mg total) by mouth daily with breakfast. 90 tablet 0  . metFORMIN (GLUCOPHAGE) 500 MG tablet Take 1 tablet (500 mg total) by mouth daily with breakfast. 30 tablet 0  . metoprolol succinate (TOPROL-XL) 25 MG 24 hr tablet Take 25 mg by mouth daily.     . pantoprazole (PROTONIX) 40 MG tablet Take 40 mg by mouth daily.    . pravastatin (PRAVACHOL) 40 MG tablet Take 40 mg by mouth every evening.    . traZODone (DESYREL) 50 MG tablet TAKE ONE TABLET (50 MG) AT BEDTIME, MAY TAKE ONE MORE IF NEEDED 180 tablet 0  . Vilazodone HCl (VIIBRYD) 20 MG TABS Take 1 tablet (20 mg total) by mouth daily. 30 tablet 0  . vitamin C (ASCORBIC ACID) 500 MG tablet Take 500 mg by mouth daily.     No current facility-administered medications on file prior to visit.     PAST MEDICAL HISTORY: Past Medical History:  Diagnosis Date  . Anxiety   . Bipolar disorder ([place])   .  Complication of anesthesia   . Depression   . GERD (gastroesophageal reflux disease)   . Heart murmur    echo- 08/30/15-epic   . History of hiatal hernia   . HLD (hyperlipidemia)   . HTN (hypertension)   . Infertility, female   . Menopause   . PONV (postoperative nausea and vomiting)     PAST SURGICAL HISTORY: Past Surgical History:  Procedure Laterality Date  . CESAREAN SECTION    . CHOLECYSTECTOMY N/A 10/05/2017   Procedure: LAPAROSCOPIC CHOLECYSTECTOMY WITH INTRAOPERATIVE CHOLANGIOGRAM;  Surgeon: Alphonsa Overall, MD;  Location: [facility];  Service: General;  Laterality: N/A;  . LAPAROSCOPY      SOCIAL HISTORY: Social History   Tobacco Use  . Smoking status: Never Smoker  . Smokeless tobacco: Never  Used  Substance Use Topics  . Alcohol use: Yes    Alcohol/week: 5.0 - 6.0 standard drinks    Types: 5 - 6 Cans of beer per week    Comment: occas   . Drug use: No    FAMILY HISTORY: Family History  Problem Relation Age of Onset  . Heart disease Mother   . Depression Mother   . Hyperlipidemia Father   . Hypertension Father   . Heart disease Father   . Cancer Father   . Depression Father   . Anxiety disorder Father   . Sleep apnea Father     ROS: Review of Systems  Constitutional: Positive for weight loss.  Gastrointestinal: Negative for nausea and vomiting.  Genitourinary: Negative for frequency.  Musculoskeletal:       Negative muscle weakness  Endo/Heme/Allergies: Negative for polydipsia.       Negative polyphagia    PHYSICAL EXAM: Blood pressure 130/84, pulse 76, temperature 98.4 F (36.9 C), temperature source Oral, height 5' 4"  (1.626 m), weight 196 lb (88.9 kg), SpO2 99 %. Body mass index is 33.64 kg/m. Physical Exam  Constitutional: She is oriented to person, place, and time. She appears well-developed and well-nourished.  Cardiovascular: Normal rate.  Pulmonary/Chest: Effort normal.  Musculoskeletal: Normal range of motion.  Neurological: She is oriented to person, place, and time.  Skin: Skin is warm and dry.  Psychiatric: She has a normal mood and affect. Her behavior is normal.  Vitals reviewed.   RECENT LABS AND TESTS: BMET    Component Value Date/Time   NA 144 05/08/2018 1246   K 4.4 05/08/2018 1246   CL 104 05/08/2018 1246   CO2 23 05/08/2018 1246   GLUCOSE 88 05/08/2018 1246   GLUCOSE 121 (H) 10/04/2017 1137   BUN 10 05/08/2018 1246   CREATININE 0.72 05/08/2018 1246   CALCIUM 9.3 05/08/2018 1246   GFRNONAA 93 05/08/2018 1246   GFRAA 108 05/08/2018 1246   Lab Results  Component Value Date   HGBA1C 5.5 05/08/2018   HGBA1C 5.4 07/12/2017   Lab Results  Component Value Date   INSULIN 8.8 05/08/2018   INSULIN 20.6 07/12/2017   CBC     Component Value Date/Time   WBC 7.2 10/04/2017 1137   RBC 3.90 10/04/2017 1137   HGB 12.7 10/04/2017 1137   HGB 13.1 07/12/2017 1055   HCT 38.3 10/04/2017 1137   HCT 40.1 07/12/2017 1055   PLT 237 10/04/2017 1137   MCV 98.2 10/04/2017 1137   MCV 99 (H) 07/12/2017 1055   MCH 32.6 10/04/2017 1137   MCHC 33.2 10/04/2017 1137   RDW 13.1 10/04/2017 1137   RDW 13.1 07/12/2017 1055   LYMPHSABS 1.2 10/04/2017 1137  LYMPHSABS 1.5 07/12/2017 1055   MONOABS 0.6 10/04/2017 1137   EOSABS 0.1 10/04/2017 1137   EOSABS 0.1 07/12/2017 1055   BASOSABS 0.0 10/04/2017 1137   BASOSABS 0.0 07/12/2017 1055   Iron/TIBC/Ferritin/ %Sat No results found for: IRON, TIBC, FERRITIN, IRONPCTSAT Lipid Panel     Component Value Date/Time   CHOL 220 (H) 05/08/2018 1246   TRIG 131 05/08/2018 1246   HDL 51 05/08/2018 1246   LDLCALC 143 (H) 05/08/2018 1246   Hepatic Function Panel     Component Value Date/Time   PROT 7.2 05/08/2018 1246   ALBUMIN 4.7 05/08/2018 1246   AST 23 05/08/2018 1246   ALT 28 05/08/2018 1246   ALKPHOS 65 05/08/2018 1246   BILITOT 0.3 05/08/2018 1246      Component Value Date/Time   TSH 2.420 07/12/2017 1055  Results for Monceaux, ERSILIA BRAWLEY (MRN [id number]) as of 06/17/2018 17:06  Ref. Range 05/08/2018 12:46  Vitamin D, 25-Hydroxy Latest Ref Range: 30.0 - 100.0 ng/mL 29.4 (L)    ASSESSMENT AND PLAN: Insulin resistance  Vitamin D deficiency - Plan: Vitamin D, Ergocalciferol, (DRISDOL) 50000 units CAPS capsule  At risk for diabetes mellitus  Class 1 obesity with serious comorbidity and body mass index (BMI) of 33.0 to 33.9 in adult, unspecified obesity type  PLAN:  Insulin Resistance Sherry Strong will continue to work on weight loss, exercise, increasing protein, and decreasing simple carbohydrates in her diet to help decrease the risk of diabetes. We dicussed metformin including benefits and risks. She was informed that eating too many simple carbohydrates or too many calories at  one sitting increases the likelihood of GI side effects. Kingslee declined metformin for now and prescription was not written today. She may consider a medication such as metformin in the future. Shakeia agrees to follow up with our clinic in 4 weeks as directed to monitor her progress.  Diabetes risk counselling Sherry Strong was given extended (15 minutes) diabetes prevention counseling today. She is 57 y.o. female and has risk factors for diabetes including obesity and insulin resistance. We discussed intensive lifestyle modifications today with an emphasis on weight loss as well as increasing exercise and decreasing simple carbohydrates in her diet.  Vitamin D Deficiency Sherry Strong was informed that low vitamin D levels contributes to fatigue and are associated with obesity, breast, and colon cancer. Sherry Strong agrees to continue taking prescription Vit D @50 ,000 IU every week #4 and we will refill for 1 month. She will increase Vit D rich foods and she will follow up for routine testing of vitamin D, at least 2-3 times per year. She was informed of the risk of over-replacement of vitamin D and agrees to not increase her dose unless she discusses this with [name] first. Samary agrees to follow up with our clinic in 4 weeks.  Obesity Sherry Strong is currently in the action stage of change. As such, her goal is to continue with weight loss efforts She has agreed to portion control better and make smarter food choices, such as increase vegetables and decrease simple carbohydrates  Sherry Strong has been instructed to work up to a goal of 150 minutes of combined cardio and strengthening exercise per week for weight loss and overall health benefits. We discussed the following Behavioral Modification Strategies today: increasing lean protein intake, decreasing simple carbohydrates, increase H20 intake, and no skipping meals  Sherry Strong will continue to work on portion control and smarter food choices at this time. She is to focus on increasing water and  protein.  Sherry Strong has agreed to follow up with our clinic in 4 weeks. She was informed of the importance of frequent follow up visits to maximize her success with intensive lifestyle modifications for her multiple health conditions.   OBESITY BEHAVIORAL INTERVENTION VISIT  Today's visit was # 19   Starting weight: 206 lbs Starting date: 07/12/17 Today's weight : 196 lbs  Today's date: 06/12/2018 Total lbs lost to date: 10    ASK: We discussed the diagnosis of obesity with Sherry Strong FOYDXAJ today and Sherry Strong agreed to give [name] permission to discuss obesity behavioral modification therapy today.  ASSESS: Sherry Strong has the diagnosis of obesity and her BMI today is 33.63 Sherry Strong is in the action stage of change   ADVISE: Sherry Strong was educated on the multiple health risks of obesity as well as the benefit of weight loss to improve her health. She was advised of the need for long term treatment and the importance of lifestyle modifications to improve her current health and to decrease her risk of future health problems.  AGREE: Multiple dietary modification options and treatment options were discussed and  Sherry Strong agreed to follow the recommendations documented in the above note.  ARRANGE: Sherry Strong was educated on the importance of frequent visits to treat obesity as outlined per CMS and USPSTF guidelines and agreed to schedule her next follow up appointment today.  Wilhemena Durie, am acting as transcriptionist for [employer], DO  I have reviewed the above documentation for accuracy and completeness, and I agree with the above. -Jearld Lesch, DO

## 2018-06-19 ENCOUNTER — Other Ambulatory Visit (INDEPENDENT_AMBULATORY_CARE_PROVIDER_SITE_OTHER): Payer: Self-pay | Admitting: Family Medicine

## 2018-06-19 DIAGNOSIS — E8881 Metabolic syndrome: Secondary | ICD-10-CM

## 2018-06-19 DIAGNOSIS — E88819 Insulin resistance, unspecified: Secondary | ICD-10-CM

## 2018-06-24 ENCOUNTER — Ambulatory Visit (INDEPENDENT_AMBULATORY_CARE_PROVIDER_SITE_OTHER): Payer: Medicare Other | Admitting: Licensed Clinical Social Worker

## 2018-06-24 DIAGNOSIS — F3131 Bipolar disorder, current episode depressed, mild: Secondary | ICD-10-CM

## 2018-06-24 NOTE — Progress Notes (Signed)
   THERAPIST PROGRESS NOTE  Session Time: 4pm-5pm  Participation Level: Active  Behavioral Response: Well GroomedAlertDepressed and Worthless  Type of Therapy: Individual Therapy  Treatment Goals addressed: Diagnosis: Bipolar affective disorder, currently depressed, moderate  Interventions: Motivational Interviewing, Solution Focused and Supportive  Summary: Sherry Strong is a 57 y.o. female who presents with increased depression related to family dynamics, living situation, self-esteem.  Client reports increasing depression due to interactions with her mother who has dementia and mother's weekend caretaker. Client notes she does not enjoy where she lives now and finds it hard to feel motivated to complete activities she previously enjoyed. Client verbalized increased sadness due to weight gain but lacking motivation to continue with improvements. 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 Client was able to verbalize the positive effects of healthy eating and exercising. Client reported worry about getting back into the job market and if she will be able to keep up.  Suicidal/Homicidal: Nowithout intent/plan; client notes passive thoughts of 'not wanting to be here' with no plan or intent; client states 'I would never hurt myself on purpose'  Therapist Response: Clinician engaged in rapport building with client after reviewing chart and problem list. Clinician provided active listening with summarizing and reflective statements. Clinician encouraged client to set small goals for herself daily or weekly. Clinician encouraged client to break down major concerns with future events into a list with specific steps she can take.  Plan: Return again in 2-3 weeks.  Diagnosis: Axis I: Bipolar, Depressed    Olegario Messier, LCSW 06/24/2018

## 2018-07-02 ENCOUNTER — Ambulatory Visit (HOSPITAL_COMMUNITY): Payer: Medicare Other | Admitting: Psychiatry

## 2018-07-02 ENCOUNTER — Encounter (HOSPITAL_COMMUNITY): Payer: Self-pay | Admitting: Psychiatry

## 2018-07-02 DIAGNOSIS — F3131 Bipolar disorder, current episode depressed, mild: Secondary | ICD-10-CM

## 2018-07-02 MED ORDER — MIRTAZAPINE 15 MG PO TABS: 15.0000 mg | ORAL_TABLET | Freq: Every day | ORAL | 1 refills | Status: DC

## 2018-07-02 MED ORDER — LAMOTRIGINE 150 MG PO TABS: 150.0000 mg | ORAL_TABLET | Freq: Every day | ORAL | 1 refills | Status: DC

## 2018-07-02 NOTE — Progress Notes (Signed)
[facility] MD/PA/NP OP Progress Note  07/02/2018 4:08 PM Sherry Strong [id number]  MRN:  [id number]  Chief Complaint: patient returns for medication management appt HPI: I am covering for Dr. Adele Schilder, who is currently out of office. Patient will continue to follow with Dr. Adele Schilder. Patient is 57 years old, divorced, lives with BF, currently dedicated to taking care of her elderly mother, whom she reports has dementia. She has history of Anxiety, Depression. She has been diagnosed with Bipolar Disorder in the past . States, however, that " I don't think that I have Bipolar" and that she feels her major history/symptoms correspond to depression and anxiety- does not currently endorse clear history of mania or hypomania, although does report some short lived mood swings and irritability. Reports she has been experiencing increased anxiety, insomnia, and some depression recently in the context of significant stressors- reports that she has conflict with her brother as he had recommended a weekend aide for their mother who has stolen money from the house .  Denies suicidal ideations, denies anhedonia, reports some decreased energy level. Does describe significant insomnia. Of note, patient reports she stopped Viibryd due to diarrhea, and is no longer taking it. She also states Trazodone not helping sleep any longer in spite of taking up to 100 mgrs QDAY  She reports she takes Xanax 0.5 mgrs BID prescribed by her PCP- denies side effects or excessive sedation, denies taking more than prescribed . She does continue to take Lamictal and Latuda. Questions whether Lamictal is helping , does feel Anette Guarneri has been partially helpful.    Visit Diagnosis:    ICD-10-CM   1. Bipolar affective disorder, currently depressed, mild (HCC) F31.31 lamoTRIgine (LAMICTAL) 150 MG tablet    Past Psychiatric History:   Past Medical History:  Past Medical History:  Diagnosis Date  . Anxiety   . Bipolar disorder ([place])   . Complication of  anesthesia   . Depression   . GERD (gastroesophageal reflux disease)   . Heart murmur    echo- 08/30/15-epic   . History of hiatal hernia   . HLD (hyperlipidemia)   . HTN (hypertension)   . Infertility, female   . Menopause   . PONV (postoperative nausea and vomiting)     Past Surgical History:  Procedure Laterality Date  . CESAREAN SECTION    . CHOLECYSTECTOMY N/A 10/05/2017   Procedure: LAPAROSCOPIC CHOLECYSTECTOMY WITH INTRAOPERATIVE CHOLANGIOGRAM;  Surgeon: Alphonsa Overall, MD;  Location: [facility];  Service: General;  Laterality: N/A;  . LAPAROSCOPY      Family Psychiatric History:   Family History:  Family History  Problem Relation Age of Onset  . Heart disease Mother   . Depression Mother   . Hyperlipidemia Father   . Hypertension Father   . Heart disease Father   . Cancer Father   . Depression Father   . Anxiety disorder Father   . Sleep apnea Father     Social History:  Social History   Socioeconomic History  . Marital status: Divorced    Spouse name: Not on file  . Number of children: Not on file  . Years of education: Not on file  . Highest education level: Not on file  Occupational History  . 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 Financial resource strain: Not hard at all  . Food insecurity:    Worry: Never true    Inability: Never true  . Transportation needs:    Medical: No    Non-medical: No  Tobacco Use  . Smoking status: Never Smoker  . Smokeless tobacco: Never Used  Substance and Sexual Activity  . Alcohol use: Yes    Alcohol/week: 5.0 - 6.0 standard drinks    Types: 5 - 6 Cans of beer per week    Comment: occas   . Drug use: No  . Sexual activity: Not Currently  Lifestyle  . Physical activity:    Days per week: 0 days    Minutes per session: 0 min  . Stress: Very much  Relationships  . Social connections:    Talks on phone: More than three times a week    Gets together: More than three times a week    Attends religious  service: Never    Active member of club or organization: No    Attends meetings of clubs or organizations: Never    Relationship status: Divorced  Other Topics Concern  . Not on file  Social History Narrative  . Not on file    Allergies:  Allergies  Allergen Reactions  . Vistaril  [Hydroxyzine Hcl] Swelling    Metabolic Disorder Labs: Lab Results  Component Value Date   HGBA1C 5.5 05/08/2018   No results found for: PROLACTIN Lab Results  Component Value Date   CHOL 220 (H) 05/08/2018   TRIG 131 05/08/2018   HDL 51 05/08/2018   LDLCALC 143 (H) 05/08/2018   LDLCALC 151 (H) 07/12/2017   Lab Results  Component Value Date   TSH 2.420 07/12/2017    Therapeutic Level Labs: No results found for: LITHIUM No results found for: VALPROATE No components found for:  CBMZ  Current Medications: Current Outpatient Medications  Medication Sig Dispense Refill  . ALPRAZolam (XANAX) 0.5 MG tablet Take 0.5 mg by mouth 2 (two) times daily as needed. For anxiety    . aspirin EC 81 MG tablet Take 81 mg by mouth.    . dexlansoprazole (DEXILANT) 60 MG capsule Take 60 mg by mouth daily.    . fenofibrate (TRICOR) 145 MG tablet Take 145 mg by mouth daily.  1  . ibuprofen (ADVIL,MOTRIN) 800 MG tablet Take 800 mg by mouth every 8 (eight) hours as needed for pain.    Marland Kitchen lamoTRIgine (LAMICTAL) 150 MG tablet Take 1 tablet (150 mg total) by mouth daily. 30 tablet 1  . lurasidone (LATUDA) 80 MG TABS tablet Take 1 tablet (80 mg total) by mouth daily with breakfast. 90 tablet 0  . metFORMIN (GLUCOPHAGE) 500 MG tablet TAKE 1 TABLET BY MOUTH EVERY DAY WITH BREAKFAST 30 tablet 0  . metoprolol succinate (TOPROL-XL) 25 MG 24 hr tablet Take 25 mg by mouth daily.     . mirtazapine (REMERON) 15 MG tablet Take 1 tablet (15 mg total) by mouth at bedtime. 30 tablet 1  . pantoprazole (PROTONIX) 40 MG tablet Take 40 mg by mouth daily.    . pravastatin (PRAVACHOL) 40 MG tablet Take 40 mg by mouth every evening.     . vitamin C (ASCORBIC ACID) 500 MG tablet Take 500 mg by mouth daily.    . Vitamin D, Ergocalciferol, (DRISDOL) 50000 units CAPS capsule Take 1 capsule (50,000 Units total) by mouth every 7 (seven) days. 4 capsule 0   No current facility-administered medications for this visit.      Musculoskeletal: Strength & Muscle Tone: within normal limits Gait & Station: normal Patient leans: N/A  Psychiatric Specialty Exam: ROS no chest pain, no dyspnea reported, no rash   Blood pressure 132/80, height 5'  3" (1.6 m), weight 201 lb (91.2 kg).Body mass index is 35.61 kg/m.  General Appearance: Well Groomed  Eye Contact:  Good  Speech:  Normal Rate  Volume:  Normal  Mood:  reports some depression and increased anxiety recently  Affect:  vaguely anxious, appropriate, reactive  Thought Process:  Linear and Descriptions of Associations: Intact  Orientation:  Full (Time, Place, and Person)  Thought Content: no hallucinations, no delusions    Suicidal Thoughts:  No currently denies suicidal ideations  Homicidal Thoughts:  No  Memory:  recentn and remote grossly intact  Judgement:  Other:  present   Insight:  Present  Psychomotor Activity:  Normal  Concentration:  Concentration: Good and Attention Span: Good  Recall:  Good  Fund of Knowledge: Good  Language: Good  Akathisia:  Negative  Handed:  Right  AIMS (if indicated): AIMS testing not done today  Assets:  Desire for Improvement Resilience  ADL's:  Intact  Cognition: WNL  Sleep:  Fair   Screenings: GAD-7     Office Visit from 06/12/2018 in [facility]  Total GAD-7 Score  21    PHQ2-9     Office Visit from 06/12/2018 in [facility] Office Visit from 07/12/2017 in [facility]  PHQ-2 Total Score  6  6  PHQ-9 Total Score  23  25       Assessment and Plan: 57 year old female, history of Bipolar Disorder diagnosis. Returns for medication management. At this time reports some  increased depression and anxiety, related to family stressors, namely conflict with brother regarding a health aide for their mother he recommended and whom she states has stolen from mother. Reports insomnia as a major concern at this time. Of note,reports she has stopped Viibryd due to GI symptoms, and that Trazodone no longer helping sleep much. We discussed options, to include starting a new antidepressant ( has been on several trials in the past ) . Agrees to discontinue Trazodone, start Remeron , which she does not remember having been on before. Side effects, including potential for sedation and weight gain, reviewed, and cautioned not to drive after taking it due to risk of sedation. Start Remeron 15 mgrs QHS. Continue Latuda at current dose. Decrease Lamictal to 150 mgrs QDAY ( patient currently reports no clear history of mania, and prefers to taper ). Vibbryd and Trazodone are discontinued .  Patient agrees to contact clinic prior to next appointment if concerns or worsening, will return in one month.    Jenne Campus, MD 07/02/2018, 4:08 PM

## 2018-07-09 ENCOUNTER — Ambulatory Visit (HOSPITAL_COMMUNITY): Payer: Medicare Other | Admitting: Licensed Clinical Social Worker

## 2018-07-09 DIAGNOSIS — F3131 Bipolar disorder, current episode depressed, mild: Secondary | ICD-10-CM

## 2018-07-10 ENCOUNTER — Ambulatory Visit (INDEPENDENT_AMBULATORY_CARE_PROVIDER_SITE_OTHER): Payer: Medicare Other | Admitting: Bariatrics

## 2018-07-10 VITALS — BP 117/76 | HR 70 | Temp 98.1°F | Ht 64.0 in | Wt 196.0 lb

## 2018-07-10 DIAGNOSIS — Z6833 Body mass index (BMI) 33.0-33.9, adult: Secondary | ICD-10-CM | POA: Diagnosis not present

## 2018-07-10 DIAGNOSIS — E669 Obesity, unspecified: Secondary | ICD-10-CM

## 2018-07-10 DIAGNOSIS — E8881 Metabolic syndrome: Secondary | ICD-10-CM

## 2018-07-10 DIAGNOSIS — E559 Vitamin D deficiency, unspecified: Secondary | ICD-10-CM | POA: Diagnosis not present

## 2018-07-10 NOTE — Progress Notes (Signed)
   THERAPIST PROGRESS NOTE  Session Time: 4pm-5pm  Participation Level: Active  Behavioral Response: CasualAlertAnxious, Depressed and Irritable  Type of Therapy: Individual Therapy  Treatment Goals addressed: Diagnosis: Bipolar affective disorder  Interventions: CBT, Motivational Interviewing and Supportive  Summary: Sherry Strong is a 57 y.o. female who presents with increasing depression and anxiety relating to family discord. Client reports ongoing discord with her family, most recently her brother. Client verbalized frustration when she feels family does not hear both sides of a story and makes a judgement based on speaking with only on person. Client notes this is an ongoing frustration and struggled to identify more helpful thoughts related to several situations. Client verbalized frustration at not being heard by her psychiatrist and due to this is changing providers. Client declined higher level of care due to providing caretaking for her mother.  Suicidal/Homicidal: Nowithout intent/plan  Therapist Response: Clinician met with client assessing for SI/HI/psychosis. Clinician inquired about homework completion and barriers to completion. Clinician reminded client of the impact of thoughts on feelings and behaviors. Clinician introduced the skill of mindful gratitude in frustrating situations. Clinician challenged clients automatic negative thoughts several times throughout session. Clinician utilized active listening skills with reflective statements socratic questioning to illiset additional information from client related to thoughts and feelings on the family situations. Clinician inquired about the need for a higher level of care due to increasing symptoms and provided options at this agency.  Plan: Return again in 2-3 weeks.  Diagnosis: Axis I: Bipolar, Depressed    Olegario Messier, LCSW 07/10/2018

## 2018-07-14 ENCOUNTER — Other Ambulatory Visit (HOSPITAL_COMMUNITY): Payer: Self-pay | Admitting: Psychiatry

## 2018-07-14 DIAGNOSIS — F3131 Bipolar disorder, current episode depressed, mild: Secondary | ICD-10-CM

## 2018-07-15 ENCOUNTER — Other Ambulatory Visit (HOSPITAL_COMMUNITY): Payer: Self-pay | Admitting: Psychiatry

## 2018-07-15 DIAGNOSIS — F3131 Bipolar disorder, current episode depressed, mild: Secondary | ICD-10-CM

## 2018-07-15 NOTE — Progress Notes (Signed)
Office: [phone]  /  Fax: [phone]   HPI:   Chief Complaint: OBESITY Sherry Strong is here to discuss her progress with her obesity treatment plan. She is on the portion control better and make smarter food choices, such as increase vegetables and decrease simple carbohydrates and is following her eating plan approximately 0 % of the time. She states she is exercising 0 minutes 0 times per week. Sherry Strong took off a month due to personal issues. She had been journaling 1300-1500 calories and 80 grams of protein previously. She has not been too focused until now. She notes minimal hunger and occasional cravings.  Her weight is 196 lb (88.9 kg) today and has not lost weight since her last visit. She has lost 10 lbs since starting treatment with [name].  Insulin Resistance Sherry Strong has a diagnosis of insulin resistance based on her elevated fasting insulin level >5. Last A1c was 5.5 and insulin was 8.8. Although Sherry Strong blood glucose readings are still under good control, insulin resistance puts her at greater risk of metabolic syndrome and diabetes. She is not taking metformin currently and continues to work on diet and exercise to decrease risk of diabetes. She denies polyphagia.  Vitamin D Deficiency Sherry Strong has a diagnosis of vitamin D deficiency. She is taking high dose prescription Vit D and denies nausea, vomiting or muscle weakness.  ALLERGIES: Allergies  Allergen Reactions  . Vistaril  [Hydroxyzine Hcl] Swelling    MEDICATIONS: Current Outpatient Medications on File Prior to Visit  Medication Sig Dispense Refill  . ALPRAZolam (XANAX) 0.5 MG tablet Take 0.5 mg by mouth 2 (two) times daily as needed. For anxiety    . aspirin EC 81 MG tablet Take 81 mg by mouth.    . dexlansoprazole (DEXILANT) 60 MG capsule Take 60 mg by mouth daily.    . fenofibrate (TRICOR) 145 MG tablet Take 145 mg by mouth daily.  1  . ibuprofen (ADVIL,MOTRIN) 800 MG tablet Take 800 mg by mouth every 8 (eight) hours as needed  for pain.    Marland Kitchen lamoTRIgine (LAMICTAL) 150 MG tablet Take 1 tablet (150 mg total) by mouth daily. 30 tablet 1  . LATUDA 80 MG TABS tablet TAKE 1 TABLET BY MOUTH EVERY DAY WITH BREAKFAST 90 tablet 0  . metFORMIN (GLUCOPHAGE) 500 MG tablet TAKE 1 TABLET BY MOUTH EVERY DAY WITH BREAKFAST 30 tablet 0  . mirtazapine (REMERON) 15 MG tablet Take 1 tablet (15 mg total) by mouth at bedtime. 30 tablet 1  . pantoprazole (PROTONIX) 40 MG tablet Take 40 mg by mouth daily.    . pravastatin (PRAVACHOL) 40 MG tablet Take 40 mg by mouth every evening.    . vitamin C (ASCORBIC ACID) 500 MG tablet Take 500 mg by mouth daily.    . Vitamin D, Ergocalciferol, (DRISDOL) 50000 units CAPS capsule Take 1 capsule (50,000 Units total) by mouth every 7 (seven) days. 4 capsule 0  . metoprolol succinate (TOPROL-XL) 25 MG 24 hr tablet Take 25 mg by mouth daily.      No current facility-administered medications on file prior to visit.     PAST MEDICAL HISTORY: Past Medical History:  Diagnosis Date  . Anxiety   . Bipolar disorder ([place])   . Complication of anesthesia   . Depression   . GERD (gastroesophageal reflux disease)   . Heart murmur    echo- 08/30/15-epic   . History of hiatal hernia   . HLD (hyperlipidemia)   . HTN (hypertension)   .  Infertility, female   . Menopause   . PONV (postoperative nausea and vomiting)     PAST SURGICAL HISTORY: Past Surgical History:  Procedure Laterality Date  . CESAREAN SECTION    . CHOLECYSTECTOMY N/A 10/05/2017   Procedure: LAPAROSCOPIC CHOLECYSTECTOMY WITH INTRAOPERATIVE CHOLANGIOGRAM;  Surgeon: Alphonsa Overall, MD;  Location: [facility];  Service: General;  Laterality: N/A;  . LAPAROSCOPY      SOCIAL HISTORY: Social History   Tobacco Use  . Smoking status: Never Smoker  . Smokeless tobacco: Never Used  Substance Use Topics  . Alcohol use: Yes    Alcohol/week: 5.0 - 6.0 standard drinks    Types: 5 - 6 Cans of beer per week    Comment: occas   . Drug use: No     FAMILY HISTORY: Family History  Problem Relation Age of Onset  . Heart disease Mother   . Depression Mother   . Hyperlipidemia Father   . Hypertension Father   . Heart disease Father   . Cancer Father   . Depression Father   . Anxiety disorder Father   . Sleep apnea Father     ROS: Review of Systems  Constitutional: Negative for weight loss.  Gastrointestinal: Negative for nausea and vomiting.  Musculoskeletal:       Negative muscle weakness  Endo/Heme/Allergies:       Negative polyphagia    PHYSICAL EXAM: Blood pressure 117/76, pulse 70, temperature 98.1 F (36.7 C), temperature source Oral, height 5\' 4"  (1.626 m), weight 196 lb (88.9 kg), SpO2 97 %. Body mass index is 33.64 kg/m. Physical Exam  Constitutional: She is oriented to person, place, and time. She appears well-developed and well-nourished.  Cardiovascular: Normal rate.  Pulmonary/Chest: Effort normal.  Musculoskeletal: Normal range of motion.  Neurological: She is oriented to person, place, and time.  Skin: Skin is warm and dry.  Psychiatric: She has a normal mood and affect. Her behavior is normal.  Vitals reviewed.   RECENT LABS AND TESTS: BMET    Component Value Date/Time   NA 144 05/08/2018 1246   K 4.4 05/08/2018 1246   CL 104 05/08/2018 1246   CO2 23 05/08/2018 1246   GLUCOSE 88 05/08/2018 1246   GLUCOSE 121 (H) 10/04/2017 1137   BUN 10 05/08/2018 1246   CREATININE 0.72 05/08/2018 1246   CALCIUM 9.3 05/08/2018 1246   GFRNONAA 93 05/08/2018 1246   GFRAA 108 05/08/2018 1246   Lab Results  Component Value Date   HGBA1C 5.5 05/08/2018   HGBA1C 5.4 07/12/2017   Lab Results  Component Value Date   INSULIN 8.8 05/08/2018   INSULIN 20.6 07/12/2017   CBC    Component Value Date/Time   WBC 7.2 10/04/2017 1137   RBC 3.90 10/04/2017 1137   HGB 12.7 10/04/2017 1137   HGB 13.1 07/12/2017 1055   HCT 38.3 10/04/2017 1137   HCT 40.1 07/12/2017 1055   PLT 237 10/04/2017 1137   MCV 98.2  10/04/2017 1137   MCV 99 (H) 07/12/2017 1055   MCH 32.6 10/04/2017 1137   MCHC 33.2 10/04/2017 1137   RDW 13.1 10/04/2017 1137   RDW 13.1 07/12/2017 1055   LYMPHSABS 1.2 10/04/2017 1137   LYMPHSABS 1.5 07/12/2017 1055   MONOABS 0.6 10/04/2017 1137   EOSABS 0.1 10/04/2017 1137   EOSABS 0.1 07/12/2017 1055   BASOSABS 0.0 10/04/2017 1137   BASOSABS 0.0 07/12/2017 1055   Iron/TIBC/Ferritin/ %Sat No results found for: IRON, TIBC, FERRITIN, IRONPCTSAT Lipid Panel  Component Value Date/Time   CHOL 220 (H) 05/08/2018 1246   TRIG 131 05/08/2018 1246   HDL 51 05/08/2018 1246   LDLCALC 143 (H) 05/08/2018 1246   Hepatic Function Panel     Component Value Date/Time   PROT 7.2 05/08/2018 1246   ALBUMIN 4.7 05/08/2018 1246   AST 23 05/08/2018 1246   ALT 28 05/08/2018 1246   ALKPHOS 65 05/08/2018 1246   BILITOT 0.3 05/08/2018 1246      Component Value Date/Time   TSH 2.420 07/12/2017 1055  Results for Peyser, RAYANNA MATUSIK (MRN [id number]) as of 07/15/2018 17:00  Ref. Range 05/08/2018 12:46  Vitamin D, 25-Hydroxy Latest Ref Range: 30.0 - 100.0 ng/mL 29.4 (L)    ASSESSMENT AND PLAN: Insulin resistance  Vitamin D deficiency  Class 1 obesity with serious comorbidity and body mass index (BMI) of 33.0 to 33.9 in adult, unspecified obesity type  PLAN:  Insulin Resistance Wilena will continue to work on weight loss, exercise, and decreasing simple carbohydrates in her diet to help decrease the risk of diabetes. We dicussed metformin including benefits and risks. She was informed that eating too many simple carbohydrates or too many calories at one sitting increases the likelihood of GI side effects. Sherry Strong declined metformin for now and prescription was not written today. Sherry Strong agrees to follow up with our clinic in 2 weeks as directed to monitor her progress.  Vitamin D Deficiency Sherry Strong was informed that low vitamin D levels contributes to fatigue and are associated with obesity, breast, and  colon cancer. Sherry Strong agrees to continue taking prescription Vit D @50 ,000 IU every week and will follow up for routine testing of vitamin D, at least 2-3 times per year. She was informed of the risk of over-replacement of vitamin D and agrees to not increase her dose unless she discusses this with [name] first. Sherry Strong agrees to follow up with our clinic in 2 weeks.  I spent > than 50% of the 15 minute visit on counseling as documented in the note.  Obesity Sherry Strong is currently in the action stage of change. As such, her goal is to continue with weight loss efforts She has agreed to follow the Category 2 plan + 100 calories (protein only) Sherry Strong has been instructed to work up to a goal of 150 minutes of combined cardio and strengthening exercise per week for weight loss and overall health benefits. We discussed the following Behavioral Modification Strategies today: increasing lean protein intake, decreasing simple carbohydrates, increasing vegetables, decrease eating out, work on meal planning and easy cooking plans, emotional eating strategies, increase H20 intake, no skipping meals, keeping healthy foods in the home, and better snacking choices Handout was given on high protein foods. Sherry Strong is to decrease calories of food and increase protein.  Sherry Strong has agreed to follow up with our clinic in 2 weeks. She was informed of the importance of frequent follow up visits to maximize her success with intensive lifestyle modifications for her multiple health conditions.   OBESITY BEHAVIORAL INTERVENTION VISIT  Today's visit was # 20   Starting weight: 206 lbs Starting date: 07/12/17 Today's weight : 196 lbs  Today's date: 07/10/2018 Total lbs lost to date: 10    ASK: We discussed the diagnosis of obesity with Sherry Strong JIRCVEL today and Sherry Strong agreed to give [name] permission to discuss obesity behavioral modification therapy today.  ASSESS: Sherry Strong has the diagnosis of obesity and her BMI today is 33.63 Sherry Strong is in  the action stage of change  ADVISE: Sherry Strong was educated on the multiple health risks of obesity as well as the benefit of weight loss to improve her health. She was advised of the need for long term treatment and the importance of lifestyle modifications to improve her current health and to decrease her risk of future health problems.  AGREE: Multiple dietary modification options and treatment options were discussed and  Sherry Strong agreed to follow the recommendations documented in the above note.  ARRANGE: Sherry Strong was educated on the importance of frequent visits to treat obesity as outlined per CMS and USPSTF guidelines and agreed to schedule her next follow up appointment today.  Wilhemena Durie, am acting as transcriptionist for [employer], DO  I have reviewed the above documentation for accuracy and completeness, and I agree with the above. -Jearld Lesch, DO

## 2018-07-23 ENCOUNTER — Ambulatory Visit (HOSPITAL_COMMUNITY): Payer: Medicare Other | Admitting: Licensed Clinical Social Worker

## 2018-07-25 ENCOUNTER — Ambulatory Visit (INDEPENDENT_AMBULATORY_CARE_PROVIDER_SITE_OTHER): Payer: Self-pay | Admitting: Bariatrics

## 2018-07-25 ENCOUNTER — Encounter (INDEPENDENT_AMBULATORY_CARE_PROVIDER_SITE_OTHER): Payer: Self-pay

## 2018-08-06 ENCOUNTER — Ambulatory Visit (HOSPITAL_COMMUNITY): Payer: Medicare Other | Admitting: Psychiatry

## 2018-08-08 ENCOUNTER — Ambulatory Visit (INDEPENDENT_AMBULATORY_CARE_PROVIDER_SITE_OTHER): Payer: Medicare Other | Admitting: Bariatrics

## 2018-08-08 ENCOUNTER — Encounter (INDEPENDENT_AMBULATORY_CARE_PROVIDER_SITE_OTHER): Payer: Self-pay | Admitting: Bariatrics

## 2018-08-08 VITALS — BP 130/70 | Temp 98.4°F | Ht 64.0 in | Wt 196.0 lb

## 2018-08-08 DIAGNOSIS — E669 Obesity, unspecified: Secondary | ICD-10-CM | POA: Diagnosis not present

## 2018-08-08 DIAGNOSIS — E8881 Metabolic syndrome: Secondary | ICD-10-CM | POA: Diagnosis not present

## 2018-08-08 DIAGNOSIS — Z6833 Body mass index (BMI) 33.0-33.9, adult: Secondary | ICD-10-CM

## 2018-08-08 DIAGNOSIS — E559 Vitamin D deficiency, unspecified: Secondary | ICD-10-CM | POA: Diagnosis not present

## 2018-08-08 MED ORDER — VITAMIN D (ERGOCALCIFEROL) 1.25 MG (50000 UNIT) PO CAPS: 50000.0000 [IU] | ORAL_CAPSULE | ORAL | 0 refills | Status: DC

## 2018-08-14 NOTE — Progress Notes (Signed)
Office: [phone]  /  Fax: [phone]   HPI:   Chief Complaint: OBESITY Sherry Strong is here to discuss her progress with her obesity treatment plan. She is following the Category 2 plan + 100 and is following her eating plan approximately 50 % of the time. She states she is doing yardwork and walking 15-20 minutes 4 times per week. Sherry Strong said that she has been struggling with the plan. She has made some bad food choices occasionally. Sherry Strong said that she has however been more active.  Her weight is  196 today and has not lost weight since her last visit. She has lost 10 lbs since starting treatment with [name].  Vitamin D deficiency Sherry Strong has a diagnosis of vitamin D deficiency. She is currently taking prescription Vit D and denies nausea, vomiting or muscle weakness.  Insulin Resistance Sherry Strong has a diagnosis of insulin resistance based on her elevated fasting insulin level >5. Although Sherry Strong's blood glucose readings are still under good control, insulin resistance puts her at greater risk of metabolic syndrome and diabetes. She is not currently taking metformin and continues to work on diet and exercise to decrease risk of diabetes.    ALLERGIES: Allergies  Allergen Reactions  . Vistaril  [Hydroxyzine Hcl] Swelling    MEDICATIONS: Current Outpatient Medications on File Prior to Visit  Medication Sig Dispense Refill  . ALPRAZolam (XANAX) 0.5 MG tablet Take 0.5 mg by mouth 2 (two) times daily as needed. For anxiety    . aspirin EC 81 MG tablet Take 81 mg by mouth.    . dexlansoprazole (DEXILANT) 60 MG capsule Take 60 mg by mouth daily.    . fenofibrate (TRICOR) 145 MG tablet Take 145 mg by mouth daily.  1  . ibuprofen (ADVIL,MOTRIN) 800 MG tablet Take 800 mg by mouth every 8 (eight) hours as needed for pain.    Marland Kitchen lamoTRIgine (LAMICTAL) 150 MG tablet Take 1 tablet (150 mg total) by mouth daily. 30 tablet 1  . LATUDA 80 MG TABS tablet TAKE 1 TABLET BY MOUTH EVERY DAY WITH BREAKFAST 90 tablet 0    . metFORMIN (GLUCOPHAGE) 500 MG tablet TAKE 1 TABLET BY MOUTH EVERY DAY WITH BREAKFAST 30 tablet 0  . mirtazapine (REMERON) 15 MG tablet Take 1 tablet (15 mg total) by mouth at bedtime. 30 tablet 1  . pantoprazole (PROTONIX) 40 MG tablet Take 40 mg by mouth daily.    . pravastatin (PRAVACHOL) 40 MG tablet Take 40 mg by mouth every evening.    . vitamin C (ASCORBIC ACID) 500 MG tablet Take 500 mg by mouth daily.    . metoprolol succinate (TOPROL-XL) 25 MG 24 hr tablet Take 25 mg by mouth daily.      No current facility-administered medications on file prior to visit.     PAST MEDICAL HISTORY: Past Medical History:  Diagnosis Date  . Anxiety   . Bipolar disorder ([place])   . Complication of anesthesia   . Depression   . GERD (gastroesophageal reflux disease)   . Heart murmur    echo- 08/30/15-epic   . History of hiatal hernia   . HLD (hyperlipidemia)   . HTN (hypertension)   . Infertility, female   . Menopause   . PONV (postoperative nausea and vomiting)     PAST SURGICAL HISTORY: Past Surgical History:  Procedure Laterality Date  . CESAREAN SECTION    . CHOLECYSTECTOMY N/A 10/05/2017   Procedure: LAPAROSCOPIC CHOLECYSTECTOMY WITH INTRAOPERATIVE CHOLANGIOGRAM;  Surgeon: Alphonsa Overall, MD;  Location: [facility];  Service: General;  Laterality: N/A;  . LAPAROSCOPY      SOCIAL HISTORY: Social History   Tobacco Use  . Smoking status: Never Smoker  . Smokeless tobacco: Never Used  Substance Use Topics  . Alcohol use: Yes    Alcohol/week: 5.0 - 6.0 standard drinks    Types: 5 - 6 Cans of beer per week    Comment: occas   . Drug use: No    FAMILY HISTORY: Family History  Problem Relation Age of Onset  . Heart disease Mother   . Depression Mother   . Hyperlipidemia Father   . Hypertension Father   . Heart disease Father   . Cancer Father   . Depression Father   . Anxiety disorder Father   . Sleep apnea Father     ROS: Review of Systems  Constitutional: Negative for  weight loss.  Gastrointestinal: Negative for nausea and vomiting.  Musculoskeletal:       Negative for muscle weakness    PHYSICAL EXAM: Temperature 98.4 F (36.9 C), temperature source Oral, height 5\' 4"  (1.626 m). Body mass index is 33.64 kg/m. Physical Exam  Constitutional: She is oriented to person, place, and time. She appears well-developed and well-nourished.  Cardiovascular: Normal rate.  Pulmonary/Chest: Effort normal.  Musculoskeletal: Normal range of motion.  Neurological: She is alert and oriented to person, place, and time.  Skin: Skin is warm and dry.  Psychiatric: She has a normal mood and affect. Her behavior is normal.  Vitals reviewed.   RECENT LABS AND TESTS: BMET    Component Value Date/Time   NA 144 05/08/2018 1246   K 4.4 05/08/2018 1246   CL 104 05/08/2018 1246   CO2 23 05/08/2018 1246   GLUCOSE 88 05/08/2018 1246   GLUCOSE 121 (H) 10/04/2017 1137   BUN 10 05/08/2018 1246   CREATININE 0.72 05/08/2018 1246   CALCIUM 9.3 05/08/2018 1246   GFRNONAA 93 05/08/2018 1246   GFRAA 108 05/08/2018 1246   Lab Results  Component Value Date   HGBA1C 5.5 05/08/2018   HGBA1C 5.4 07/12/2017   Lab Results  Component Value Date   INSULIN 8.8 05/08/2018   INSULIN 20.6 07/12/2017   CBC    Component Value Date/Time   WBC 7.2 10/04/2017 1137   RBC 3.90 10/04/2017 1137   HGB 12.7 10/04/2017 1137   HGB 13.1 07/12/2017 1055   HCT 38.3 10/04/2017 1137   HCT 40.1 07/12/2017 1055   PLT 237 10/04/2017 1137   MCV 98.2 10/04/2017 1137   MCV 99 (H) 07/12/2017 1055   MCH 32.6 10/04/2017 1137   MCHC 33.2 10/04/2017 1137   RDW 13.1 10/04/2017 1137   RDW 13.1 07/12/2017 1055   LYMPHSABS 1.2 10/04/2017 1137   LYMPHSABS 1.5 07/12/2017 1055   MONOABS 0.6 10/04/2017 1137   EOSABS 0.1 10/04/2017 1137   EOSABS 0.1 07/12/2017 1055   BASOSABS 0.0 10/04/2017 1137   BASOSABS 0.0 07/12/2017 1055   Iron/TIBC/Ferritin/ %Sat No results found for: IRON, TIBC, FERRITIN,  IRONPCTSAT Lipid Panel     Component Value Date/Time   CHOL 220 (H) 05/08/2018 1246   TRIG 131 05/08/2018 1246   HDL 51 05/08/2018 1246   LDLCALC 143 (H) 05/08/2018 1246   Hepatic Function Panel     Component Value Date/Time   PROT 7.2 05/08/2018 1246   ALBUMIN 4.7 05/08/2018 1246   AST 23 05/08/2018 1246   ALT 28 05/08/2018 1246   ALKPHOS 65 05/08/2018 1246  BILITOT 0.3 05/08/2018 1246      Component Value Date/Time   TSH 2.420 07/12/2017 1055   Results for Perdew, JANINE RELLER (MRN [id number]) as of 08/14/2018 17:50  Ref. Range 05/08/2018 12:46  Vitamin D, 25-Hydroxy Latest Ref Range: 30.0 - 100.0 ng/mL 29.4 (L)   ASSESSMENT AND PLAN: Vitamin D deficiency - Plan: Vitamin D, Ergocalciferol, (DRISDOL) 1.25 MG (50000 UT) CAPS capsule  Insulin resistance  Class 1 obesity with serious comorbidity and body mass index (BMI) of 33.0 to 33.9 in adult, unspecified obesity type  PLAN: Vitamin D Deficiency Sherry Strong was informed that low vitamin D levels contributes to fatigue and are associated with obesity, breast, and colon cancer. She agrees to continue taking prescription Vit D @50 ,000 IU every week #4 with no refills and will follow up for routine testing of vitamin D, at least 2-3 times per year. She was informed of the risk of over-replacement of vitamin D and agrees to not increase her dose unless she discusses this with [name] first. Sherry Strong agrees to follow up with our office in 2 weeks.   Insulin Resistance Sherry Strong will continue to work on weight loss, exercise, and decreasing simple carbohydrates in her diet to help decrease the risk of diabetes. We dicussed metformin including benefits and risks. She was informed that eating too many simple carbohydrates or too many calories at one sitting increases the likelihood of GI side effects. She will continue to decrease carbohydrates and increase protein. Sherry Strong declined metformin for now.Sherry Strong agreed to follow up with our office in 2 weeks.    Obesity Sherry Strong is currently in the action stage of change. As such, her goal is to continue with weight loss efforts She has agreed to follow the Category 2 plan +100 Sherry Strong has been instructed to work up to a goal of 150 minutes of combined cardio and strengthening exercise per week for weight loss and overall health benefits. We discussed the following Behavioral Modification Strategies today: increasing lean protein intake, increase H2O intake, travel eating strategies, decreasing simple carbohydrates , increasing vegetables, work on meal planning and easy cooking plans and holiday eating strategies. Sherry Strong will continue to work on making better choices and she will consider other protein sources.   Sherry Strong has agreed to follow up with our clinic in 2 weeks. She was informed of the importance of frequent follow up visits to maximize her success with intensive lifestyle modifications for her multiple health conditions.   OBESITY BEHAVIORAL INTERVENTION VISIT  Today's visit was # 21   Starting weight: 206 lbs Starting date: 07/12/2017 Today's weight :   196 lbs Today's date: 08/08/2018 Total lbs lost to date: 10 lbs At least 15 minutes were spent on discussing the following behavioral intervention visit.   ASK: We discussed the diagnosis of obesity with Sherry Strong [facility] today and Etola agreed to give [name] permission to discuss obesity behavioral modification therapy today.  ASSESS: Sherry Strong has the diagnosis of obesity and her BMI today is 33.7 Sherry Strong is in the action stage of change   ADVISE: Sherry Strong was educated on the multiple health risks of obesity as well as the benefit of weight loss to improve her health. She was advised of the need for long term treatment and the importance of lifestyle modifications to improve her current health and to decrease her risk of future health problems.  AGREE: Multiple dietary modification options and treatment options were discussed and  Sherry Strong agreed to follow the  recommendations documented in the above note.  ARRANGE: Marshe was educated on the importance of frequent visits to treat obesity as outlined per CMS and USPSTF guidelines and agreed to schedule her next follow up appointment today.  I, Remi Deter, CMA, am acting as Location manager for [employer], DO.   I have reviewed the above documentation for accuracy and completeness, and I agree with the above. -Jearld Lesch, DO

## 2018-08-15 ENCOUNTER — Encounter (INDEPENDENT_AMBULATORY_CARE_PROVIDER_SITE_OTHER): Payer: Self-pay | Admitting: Bariatrics

## 2018-08-15 DIAGNOSIS — Z6833 Body mass index (BMI) 33.0-33.9, adult: Secondary | ICD-10-CM

## 2018-08-15 DIAGNOSIS — E669 Obesity, unspecified: Secondary | ICD-10-CM | POA: Insufficient documentation

## 2018-08-17 IMAGING — RF DG CHOLANGIOGRAM OPERATIVE
1 series · 4 of 4 positions shown · non-contrast
Comparison: Abdominal ultrasound - 10/01/2017

CLINICAL DATA: Intraoperative cholangiogram during laparoscopic
cholecystectomy.

EXAM:
INTRAOPERATIVE CHOLANGIOGRAM
FLUOROSCOPY TIME:  10 seconds

[Series 1: run · 4 of 50 frames shown]
[frame 8/50]
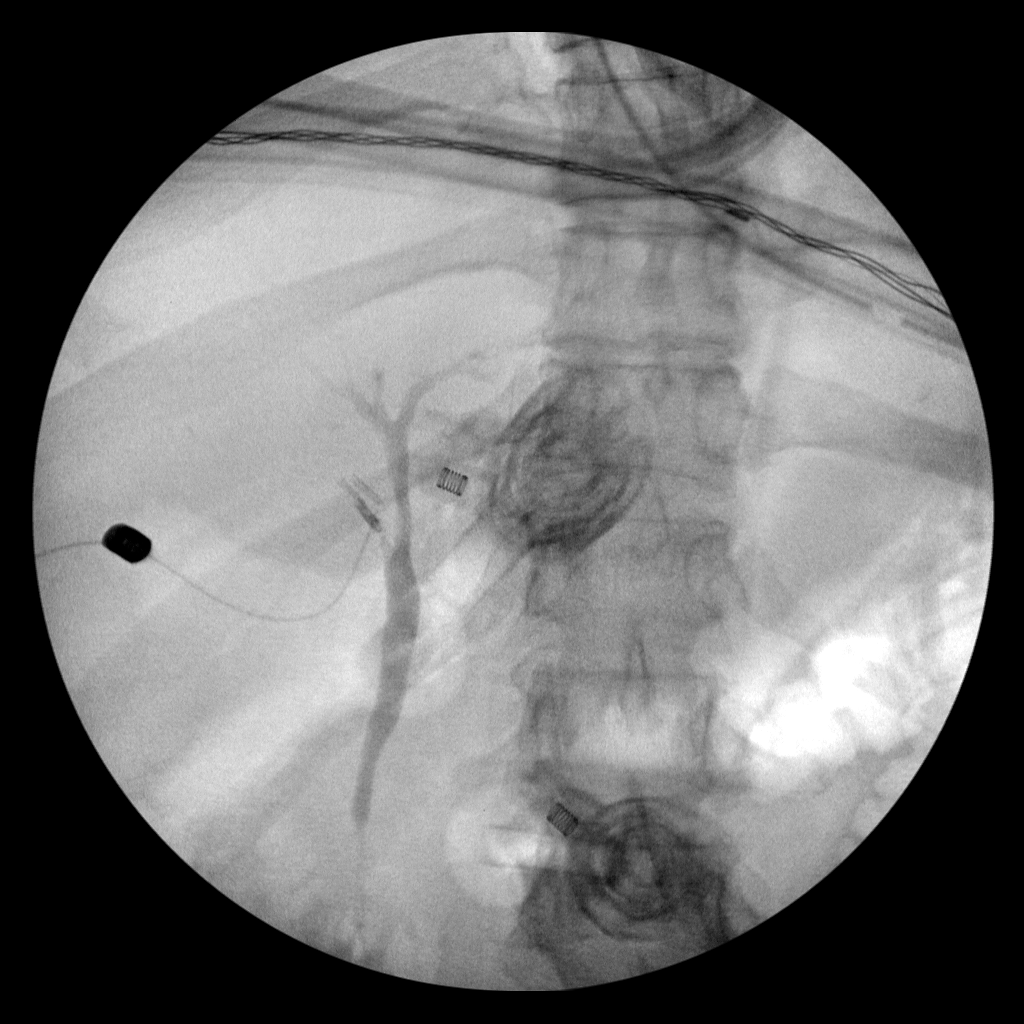
[frame 26/50]
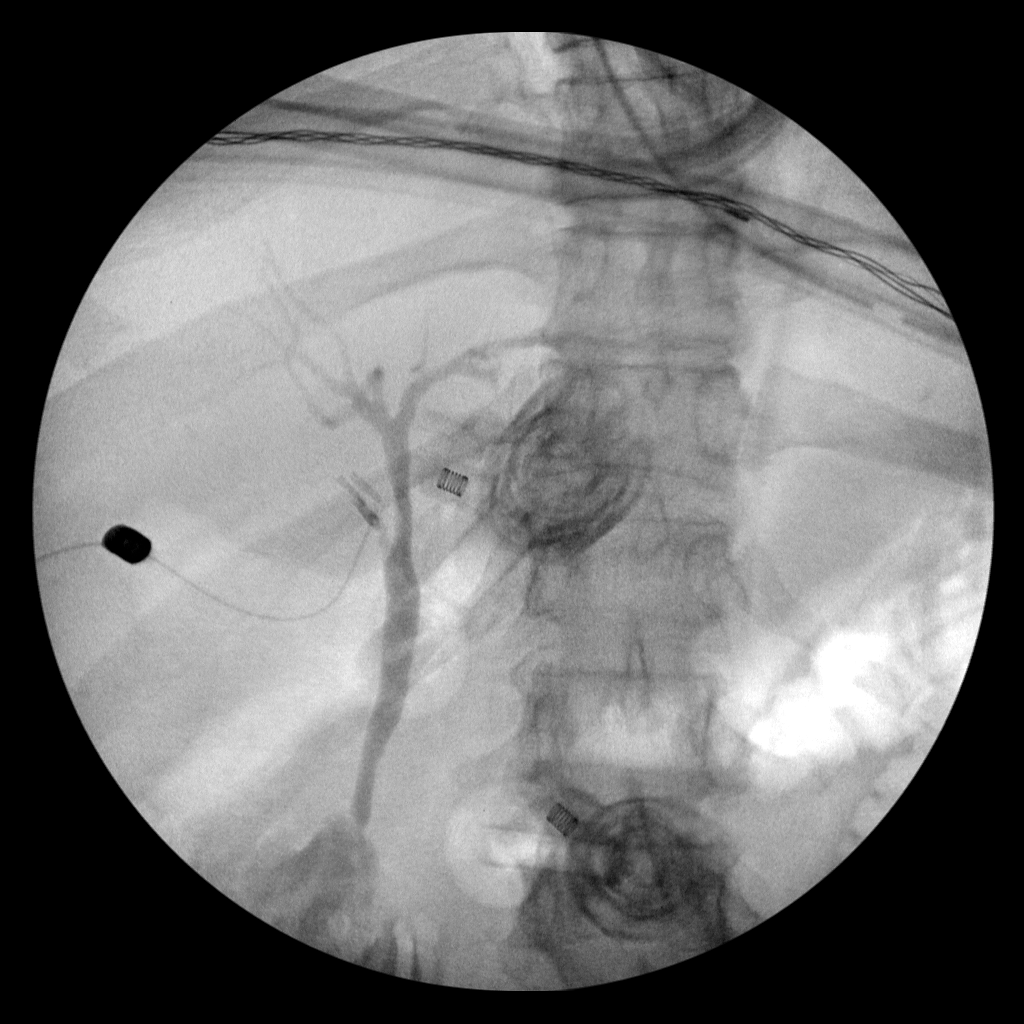
[frame 43/50]
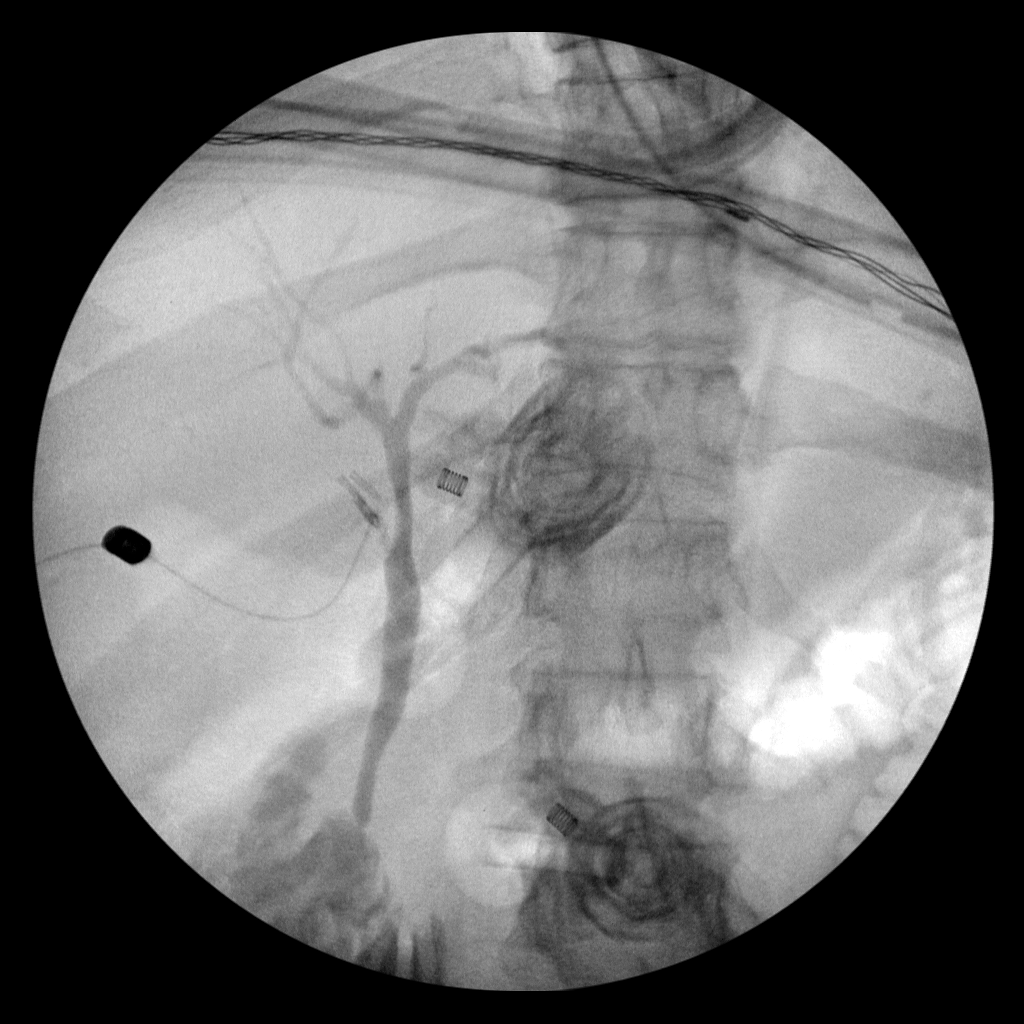
[frame 50/50]
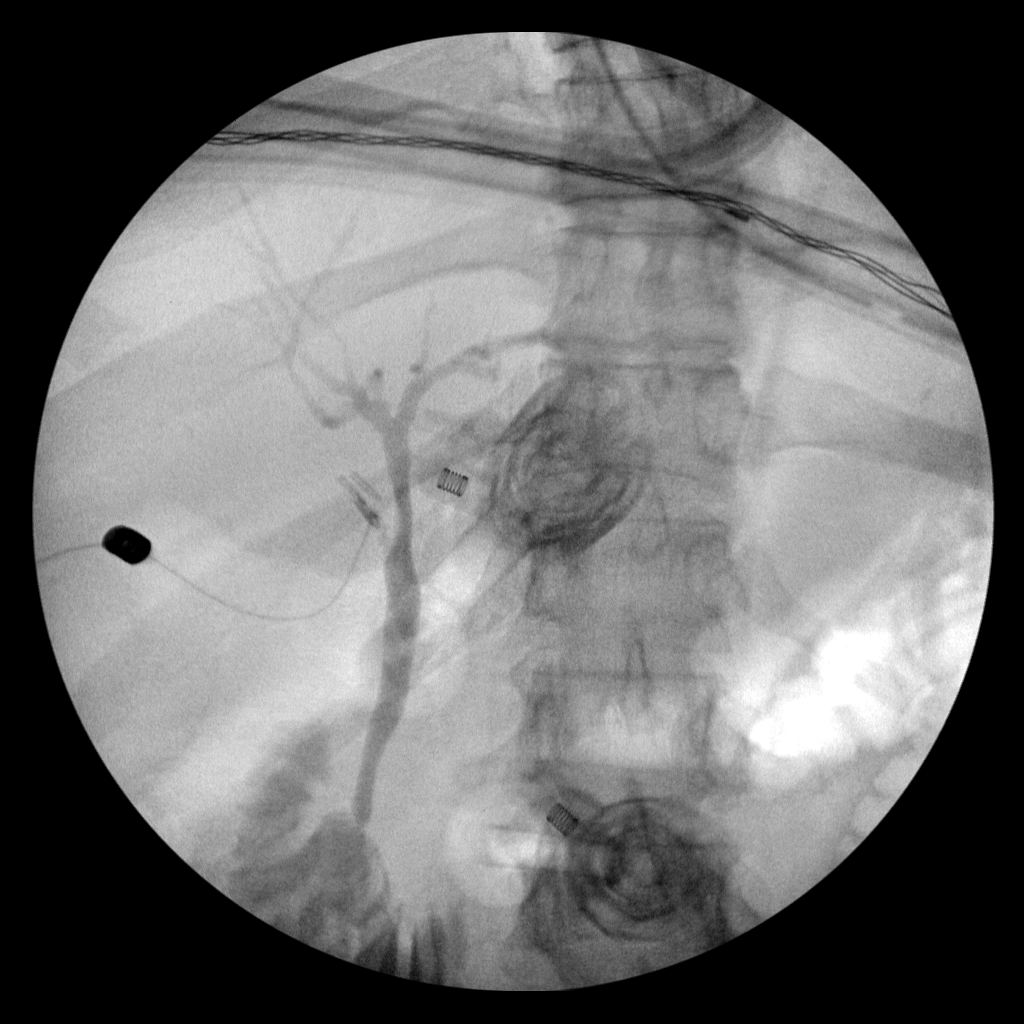

[4 of 4 positions shown; findings below may reference images not displayed]

FINDINGS: Intraoperative cholangiographic images of the right upper abdominal
quadrant during laparoscopic cholecystectomy are provided for
review.

Surgical clips overlie the expected location of the gallbladder
fossa.

Contrast injection demonstrates selective cannulation of the central
aspect of the cystic duct.

There is passage of contrast through the central aspect of the
cystic duct with filling of a non dilated common bile duct. There is
passage of contrast though the CBD and into the descending portion
of the duodenum.

There is minimal reflux of injected contrast into the common hepatic
duct and central aspect of the non dilated intrahepatic biliary
system.

There are no discrete filling defects within the opacified portions
of the biliary system to suggest the presence of
choledocholithiasis.
IMPRESSION: No evidence of choledocholithiasis.

## 2018-08-26 ENCOUNTER — Ambulatory Visit (INDEPENDENT_AMBULATORY_CARE_PROVIDER_SITE_OTHER): Payer: Medicare Other | Admitting: Bariatrics

## 2018-08-26 ENCOUNTER — Encounter (INDEPENDENT_AMBULATORY_CARE_PROVIDER_SITE_OTHER): Payer: Self-pay | Admitting: Bariatrics

## 2018-08-26 VITALS — BP 124/81 | HR 83 | Temp 97.9°F | Ht 64.0 in | Wt 198.0 lb

## 2018-08-26 DIAGNOSIS — E7849 Other hyperlipidemia: Secondary | ICD-10-CM | POA: Diagnosis not present

## 2018-08-26 DIAGNOSIS — E8881 Metabolic syndrome: Secondary | ICD-10-CM | POA: Diagnosis not present

## 2018-08-26 DIAGNOSIS — E559 Vitamin D deficiency, unspecified: Secondary | ICD-10-CM

## 2018-08-26 DIAGNOSIS — Z6834 Body mass index (BMI) 34.0-34.9, adult: Secondary | ICD-10-CM | POA: Diagnosis not present

## 2018-08-26 DIAGNOSIS — E669 Obesity, unspecified: Secondary | ICD-10-CM

## 2018-08-26 MED ORDER — VITAMIN D (ERGOCALCIFEROL) 1.25 MG (50000 UNIT) PO CAPS: 50000.0000 [IU] | ORAL_CAPSULE | ORAL | 0 refills | Status: DC

## 2018-08-26 MED ORDER — METFORMIN HCL 500 MG PO TABS: ORAL_TABLET | ORAL | 0 refills | Status: DC

## 2018-08-29 NOTE — Progress Notes (Signed)
Office: [phone]  /  Fax: [phone]   HPI:   Chief Complaint: OBESITY Sherry Strong is here to discuss her progress with her obesity treatment plan. She is on the Category 2 plan + 100 calories and is following her eating plan approximately 55 to 60% of the time. She states she is weights and walking 60 minutes 3 times per week. Sherry Strong has started back to the gym 3 to 4 times a week. She will see her psychologist and change some weight promoting medications (Latuda).  Her weight is 198 lb (89.8 kg) today and has had a weight gain of 2 pounds over a period of 2 weeks since her last visit. She has lost 8 lbs since starting treatment with [name].  Insulin Resistance Sherry Strong has a diagnosis of insulin resistance based on her elevated fasting insulin level >5. Although Sharnita's blood glucose readings are still under good control, insulin resistance puts her at greater risk of metabolic syndrome and diabetes. She is taking metformin currently and continues to work on diet and exercise to decrease risk of diabetes. She denies polyphagia.  Vitamin D deficiency Sherry Strong has a diagnosis of vitamin D deficiency. She is currently taking high dose vit D and denies nausea, vomiting, or muscle weakness.  Hyperlipidemia Sherry Strong has hyperlipidemia and has been trying to improve her cholesterol levels with intensive lifestyle modification including a low saturated fat diet, exercise and weight loss. She is taking Tricor 145mg .  ALLERGIES: Allergies  Allergen Reactions  . Vistaril  [Hydroxyzine Hcl] Swelling    MEDICATIONS: Current Outpatient Medications on File Prior to Visit  Medication Sig Dispense Refill  . ALPRAZolam (XANAX) 0.5 MG tablet Take 0.5 mg by mouth 2 (two) times daily as needed. For anxiety    . aspirin EC 81 MG tablet Take 81 mg by mouth.    . dexlansoprazole (DEXILANT) 60 MG capsule Take 60 mg by mouth daily.    . fenofibrate (TRICOR) 145 MG tablet Take 145 mg by mouth daily.  1  . ibuprofen  (ADVIL,MOTRIN) 800 MG tablet Take 800 mg by mouth every 8 (eight) hours as needed for pain.    Marland Kitchen lamoTRIgine (LAMICTAL) 150 MG tablet Take 1 tablet (150 mg total) by mouth daily. 30 tablet 1  . LATUDA 80 MG TABS tablet TAKE 1 TABLET BY MOUTH EVERY DAY WITH BREAKFAST 90 tablet 0  . pantoprazole (PROTONIX) 40 MG tablet Take 40 mg by mouth daily.    . pravastatin (PRAVACHOL) 40 MG tablet Take 40 mg by mouth every evening.    . vitamin C (ASCORBIC ACID) 500 MG tablet Take 500 mg by mouth daily.    . metoprolol succinate (TOPROL-XL) 25 MG 24 hr tablet Take 25 mg by mouth daily.      No current facility-administered medications on file prior to visit.     PAST MEDICAL HISTORY: Past Medical History:  Diagnosis Date  . Anxiety   . Bipolar disorder ([place])   . Complication of anesthesia   . Depression   . GERD (gastroesophageal reflux disease)   . Heart murmur    echo- 08/30/15-epic   . History of hiatal hernia   . HLD (hyperlipidemia)   . HTN (hypertension)   . Infertility, female   . Menopause   . PONV (postoperative nausea and vomiting)     PAST SURGICAL HISTORY: Past Surgical History:  Procedure Laterality Date  . CESAREAN SECTION    . CHOLECYSTECTOMY N/A 10/05/2017   Procedure: LAPAROSCOPIC CHOLECYSTECTOMY WITH INTRAOPERATIVE CHOLANGIOGRAM;  Surgeon: Alphonsa Overall, MD;  Location: [facility];  Service: General;  Laterality: N/A;  . LAPAROSCOPY      SOCIAL HISTORY: Social History   Tobacco Use  . Smoking status: Never Smoker  . Smokeless tobacco: Never Used  Substance Use Topics  . Alcohol use: Yes    Alcohol/week: 5.0 - 6.0 standard drinks    Types: 5 - 6 Cans of beer per week    Comment: occas   . Drug use: No    FAMILY HISTORY: Family History  Problem Relation Age of Onset  . Heart disease Mother   . Depression Mother   . Hyperlipidemia Father   . Hypertension Father   . Heart disease Father   . Cancer Father   . Depression Father   . Anxiety disorder Father   .  Sleep apnea Father     ROS: Review of Systems  Constitutional: Negative for weight loss.  Gastrointestinal: Negative for nausea and vomiting.  Musculoskeletal:       Negative for muscle weakness.  Endo/Heme/Allergies:       Negative for polyphagia.    PHYSICAL EXAM: Blood pressure 124/81, pulse 83, temperature 97.9 F (36.6 C), temperature source Oral, height 5\' 4"  (1.626 m), weight 198 lb (89.8 kg), SpO2 100 %. Body mass index is 33.99 kg/m. Physical Exam  Constitutional: She is oriented to person, place, and time. She appears well-developed and well-nourished.  Cardiovascular: Normal rate.  Pulmonary/Chest: Effort normal.  Musculoskeletal: Normal range of motion.  Neurological: She is oriented to person, place, and time.  Skin: Skin is warm and dry.  Psychiatric: She has a normal mood and affect. Her behavior is normal.  Vitals reviewed.   RECENT LABS AND TESTS: BMET    Component Value Date/Time   NA 144 05/08/2018 1246   K 4.4 05/08/2018 1246   CL 104 05/08/2018 1246   CO2 23 05/08/2018 1246   GLUCOSE 88 05/08/2018 1246   GLUCOSE 121 (H) 10/04/2017 1137   BUN 10 05/08/2018 1246   CREATININE 0.72 05/08/2018 1246   CALCIUM 9.3 05/08/2018 1246   GFRNONAA 93 05/08/2018 1246   GFRAA 108 05/08/2018 1246   Lab Results  Component Value Date   HGBA1C 5.5 05/08/2018   HGBA1C 5.4 07/12/2017   Lab Results  Component Value Date   INSULIN 8.8 05/08/2018   INSULIN 20.6 07/12/2017   CBC    Component Value Date/Time   WBC 7.2 10/04/2017 1137   RBC 3.90 10/04/2017 1137   HGB 12.7 10/04/2017 1137   HGB 13.1 07/12/2017 1055   HCT 38.3 10/04/2017 1137   HCT 40.1 07/12/2017 1055   PLT 237 10/04/2017 1137   MCV 98.2 10/04/2017 1137   MCV 99 (H) 07/12/2017 1055   MCH 32.6 10/04/2017 1137   MCHC 33.2 10/04/2017 1137   RDW 13.1 10/04/2017 1137   RDW 13.1 07/12/2017 1055   LYMPHSABS 1.2 10/04/2017 1137   LYMPHSABS 1.5 07/12/2017 1055   MONOABS 0.6 10/04/2017 1137    EOSABS 0.1 10/04/2017 1137   EOSABS 0.1 07/12/2017 1055   BASOSABS 0.0 10/04/2017 1137   BASOSABS 0.0 07/12/2017 1055   Iron/TIBC/Ferritin/ %Sat No results found for: IRON, TIBC, FERRITIN, IRONPCTSAT Lipid Panel     Component Value Date/Time   CHOL 220 (H) 05/08/2018 1246   TRIG 131 05/08/2018 1246   HDL 51 05/08/2018 1246   LDLCALC 143 (H) 05/08/2018 1246   Hepatic Function Panel     Component Value Date/Time   PROT 7.2  05/08/2018 1246   ALBUMIN 4.7 05/08/2018 1246   AST 23 05/08/2018 1246   ALT 28 05/08/2018 1246   ALKPHOS 65 05/08/2018 1246   BILITOT 0.3 05/08/2018 1246      Component Value Date/Time   TSH 2.420 07/12/2017 1055   Results for Strong, Sherry DEPREY (MRN [id number]) as of 08/29/2018 10:46  Ref. Range 05/08/2018 12:46  Vitamin D, 25-Hydroxy Latest Ref Range: 30.0 - 100.0 ng/mL 29.4 (L)   ASSESSMENT AND PLAN: Insulin resistance - Plan: metFORMIN (GLUCOPHAGE) 500 MG tablet  Vitamin D deficiency - Plan: Vitamin D, Ergocalciferol, (DRISDOL) 1.25 MG (50000 UT) CAPS capsule  Other hyperlipidemia  Class 1 obesity with serious comorbidity and body mass index (BMI) of 34.0 to 34.9 in adult, unspecified obesity type  PLAN:  Insulin Resistance Sherry Strong will continue to work on weight loss, exercise, and decreasing simple carbohydrates in her diet to help decrease the risk of diabetes. She was informed that eating too many simple carbohydrates or too many calories at one sitting increases the likelihood of GI side effects. Sherry Strong agreed to continue taking metformin 500mg , 1 PO daily #30 with no refills and a prescription was written today. She also agrees to increase protein and decrease carbohydrates in the diet. Kemiyah agreed to follow up with [name] as directed to monitor her progress in 2 weeks.  Vitamin D Deficiency Sherry Strong was informed that low vitamin D levels contributes to fatigue and are associated with obesity, breast, and colon cancer. She agrees to continue to take  prescription Vit D @50 ,000 IU weekly #4 with no refills and will follow up for routine testing of vitamin D, at least 2-3 times per year. She was informed of the risk of over-replacement of vitamin D and agrees to not increase her dose unless she discusses this with [name] first. Mica agrees with this plan.  Hyperlipidemia Sherry Strong was informed of the American Heart Association Guidelines emphasizing intensive lifestyle modifications as the first line treatment for hyperlipidemia. We discussed many lifestyle modifications today in depth, and Marillyn will continue to work on decreasing saturated fats such as fatty red meat, butter and many fried foods. She will also increase vegetables and lean protein in her diet and continue to work on exercise and weight loss efforts. Adeena agrees to continue her current medications and will follow up at the agreed upon time.  Obesity Day is currently in the action stage of change. As such, her goal is to continue with weight loss efforts. She has agreed to follow the Category 2 plan + 100 calories and will not skip meals. Oaklynn has been instructed to work up to a goal of 150 minutes of combined cardio and strengthening exercise per week for weight loss and overall health benefits or continue her current exercise. We discussed the following Behavioral Modification Strategies today: increasing lean protein intake, decreasing simple carbohydrates, increasing vegetables, increase H2O intake, and no skipping meals.  Sherry Strong has agreed to follow up with our clinic in 2 weeks. She was informed of the importance of frequent follow up visits to maximize her success with intensive lifestyle modifications for her multiple health conditions.   OBESITY BEHAVIORAL INTERVENTION VISIT  Today's visit was # 22   Starting weight: 206 lbs Starting date: 07/12/17 Today's weight : Weight: 198 lb (89.8 kg)  Today's date: 08/26/2018 Total lbs lost to date: 8 At least 15 minutes were spent on  discussing the following behavioral intervention visit.   ASK: We discussed the diagnosis of obesity with  Sherry Strong IYMEBRA today and Sherry Strong agreed to give [name] permission to discuss obesity behavioral modification therapy today.  ASSESS: Kedra has the diagnosis of obesity and her BMI today is 33.97. Mathilda is in the action stage of change   ADVISE: Ericca was educated on the multiple health risks of obesity as well as the benefit of weight loss to improve her health. She was advised of the need for long term treatment and the importance of lifestyle modifications to improve her current health and to decrease her risk of future health problems.  AGREE: Multiple dietary modification options and treatment options were discussed and Miral agreed to follow the recommendations documented in the above note.  ARRANGE: Nasra was educated on the importance of frequent visits to treat obesity as outlined per CMS and USPSTF guidelines and agreed to schedule her next follow up appointment today.  I, Marcille Blanco, am acting as Location manager for [employer]. Owens Shark, DO

## 2018-09-02 ENCOUNTER — Encounter (INDEPENDENT_AMBULATORY_CARE_PROVIDER_SITE_OTHER): Payer: Self-pay | Admitting: Bariatrics

## 2018-09-03 ENCOUNTER — Other Ambulatory Visit (HOSPITAL_COMMUNITY): Payer: Self-pay | Admitting: Psychiatry

## 2018-09-09 ENCOUNTER — Ambulatory Visit (INDEPENDENT_AMBULATORY_CARE_PROVIDER_SITE_OTHER): Payer: Medicare Other | Admitting: Bariatrics

## 2018-09-09 ENCOUNTER — Encounter (INDEPENDENT_AMBULATORY_CARE_PROVIDER_SITE_OTHER): Payer: Self-pay | Admitting: Bariatrics

## 2018-09-09 VITALS — BP 125/84 | HR 96 | Temp 98.0°F | Ht 64.0 in | Wt 198.0 lb

## 2018-09-09 DIAGNOSIS — E559 Vitamin D deficiency, unspecified: Secondary | ICD-10-CM

## 2018-09-09 DIAGNOSIS — F3289 Other specified depressive episodes: Secondary | ICD-10-CM | POA: Diagnosis not present

## 2018-09-09 DIAGNOSIS — E8881 Metabolic syndrome: Secondary | ICD-10-CM | POA: Diagnosis not present

## 2018-09-09 DIAGNOSIS — E669 Obesity, unspecified: Secondary | ICD-10-CM

## 2018-09-09 DIAGNOSIS — E88819 Insulin resistance, unspecified: Secondary | ICD-10-CM

## 2018-09-09 DIAGNOSIS — E66811 Obesity, class 1: Secondary | ICD-10-CM

## 2018-09-09 DIAGNOSIS — R7989 Other specified abnormal findings of blood chemistry: Secondary | ICD-10-CM

## 2018-09-09 DIAGNOSIS — Z6834 Body mass index (BMI) 34.0-34.9, adult: Secondary | ICD-10-CM

## 2018-09-10 NOTE — Progress Notes (Signed)
Office: [phone]  /  Fax: [phone]   HPI:   Chief Complaint: OBESITY Sherry Strong is here to discuss her progress with her obesity treatment plan. She is on the Category 2 plan +100 calories and is following her eating plan approximately 85 % of the time. She states she is walking 60 minutes 3 times per week at the gym. Sherry Strong is slightly frustrated (increased gym and increased adherence to the plan) per patient. She is doing well with her water. She has increased her protein and she is not snacking.  Her weight is 198 lb (89.8 kg) today and she has maintained weight over a period of 2 weeks since her last visit. She has lost 8 lbs since starting treatment with [name].  Insulin Resistance Sherry Strong has a diagnosis of insulin resistance based on her elevated fasting insulin level >5. Although Sherry Strong's blood glucose readings are still under good control, insulin resistance puts her at greater risk of metabolic syndrome and diabetes. She is taking metformin currently and continues to work on diet and exercise to decrease risk of diabetes.  Vitamin D deficiency Sherry Strong has a diagnosis of vitamin D deficiency. She is currently taking vit D and denies nausea, vomiting or muscle weakness.  Elevated TSH 5.4 Sherry Strong has elevated TSH from [facility]. She has increased stress.  Depression with emotional eating behaviors Sherry Strong is seeing a psychiatrist. She is weaning off the [place] and is starting a new medication. Sherry Strong struggles with emotional eating and using food for comfort to the extent that it is negatively impacting her health. She often snacks when she is not hungry. Sherry Strong sometimes feels she is out of control and then feels guilty that she made poor food choices. She has been working on behavior modification techniques to help reduce her emotional eating and has been somewhat successful. She shows no sign of suicidal or homicidal ideations.  Depression screen Sherry Strong 2/9 06/12/2018 07/12/2017    Decreased Interest 3 3  Down, Depressed, Hopeless 3 3  PHQ - 2 Score 6 6  Altered sleeping 3 3  Tired, decreased energy 3 3  Change in appetite 2 3  Feeling bad or failure about yourself  3 2  Trouble concentrating 3 3  Moving slowly or fidgety/restless 2 3  Suicidal thoughts 1 2  PHQ-9 Score 23 25  Difficult doing work/chores - Extremely dIfficult     ASSESSMENT AND PLAN:  Insulin resistance  Vitamin D deficiency  Elevated TSH  Other depression - with emotional eating  Class 1 obesity with serious comorbidity and body mass index (BMI) of 34.0 to 34.9 in adult, unspecified obesity type  PLAN:  Insulin Resistance Sherry Strong will continue to work on weight loss, exercise, and decreasing simple carbohydrates in her diet to help decrease the risk of diabetes. We dicussed metformin including benefits and risks. She was informed that eating too many simple carbohydrates or too many calories at one sitting increases the likelihood of GI side effects. Sherry Strong had stopped metformin and will resume metformin (has prescription). Sherry Strong Strong to follow up with [name] as directed to monitor her progress.  Vitamin D Deficiency Sherry Strong was informed that low vitamin D levels contributes to fatigue and are associated with obesity, breast, and colon cancer. She agrees to continue to take prescription Vit D @50 ,000 IU every week and will follow up for routine testing of vitamin D, at least 2-3 times per year. She was informed of the risk of over-replacement of vitamin D and  agrees to not increase her dose unless she discusses this with [name] first.  Elevated TSH 5.4 Her PCP will check her TSH in 6 weeks. Sherry Strong will follow up with our clinic in 2 weeks.  Depression with Emotional Eating Behaviors We discussed behavior modification techniques today to help Sherry Strong deal with her emotional eating and depression. Will consider TMS if no improvement (per psychiatrist). Sherry Strong will follow up with her psychiatrist and she will  follow up with our clinic in 2 weeks.   I spent > than 50% of the 15 minute visit on counseling as documented in the note.  Obesity Sherry Strong is currently in the action stage of change. As such, her goal is to continue with weight loss efforts She has Strong to follow the Category 2 plan Sherry Strong has been instructed to work up to a goal of 150 minutes of combined cardio and strengthening exercise per week for weight loss and overall health benefits. We discussed the following Behavioral Modification Strategies today: continue to increase H2O intake, no skipping meals, better snacking choices, increasing lean protein intake, decreasing simple carbohydrates, increasing vegetables, work on meal planning and easy cooking plans, holiday eating strategies and celebration eating strategies  Sherry Strong has Strong to follow up with our clinic in 2 weeks. She was informed of the importance of frequent follow up visits to maximize her success with intensive lifestyle modifications for her multiple health conditions.  ALLERGIES: Allergies  Allergen Reactions  . Vistaril  [Hydroxyzine Hcl] Swelling    MEDICATIONS: Current Outpatient Medications on File Prior to Visit  Medication Sig Dispense Refill  . ALPRAZolam (XANAX) 0.5 MG tablet Take 0.5 mg by mouth 2 (two) times daily as needed. For anxiety    . aspirin EC 81 MG tablet Take 81 mg by mouth.    . dexlansoprazole (DEXILANT) 60 MG capsule Take 60 mg by mouth daily.    . fenofibrate (TRICOR) 145 MG tablet Take 145 mg by mouth daily.  1  . ibuprofen (ADVIL,MOTRIN) 800 MG tablet Take 800 mg by mouth every 8 (eight) hours as needed for pain.    Marland Kitchen lamoTRIgine (LAMICTAL) 150 MG tablet Take 1 tablet (150 mg total) by mouth daily. 30 tablet 1  . LATUDA 80 MG TABS tablet TAKE 1 TABLET BY MOUTH EVERY DAY WITH BREAKFAST 90 tablet 0  . metFORMIN (GLUCOPHAGE) 500 MG tablet TAKE 1 TABLET BY MOUTH EVERY DAY WITH BREAKFAST 30 tablet 0  . pantoprazole (PROTONIX) 40 MG tablet  Take 40 mg by mouth daily.    . pravastatin (PRAVACHOL) 40 MG tablet Take 40 mg by mouth every evening.    . vitamin C (ASCORBIC ACID) 500 MG tablet Take 500 mg by mouth daily.    . Vitamin D, Ergocalciferol, (DRISDOL) 1.25 MG (50000 UT) CAPS capsule Take 1 capsule (50,000 Units total) by mouth every 7 (seven) days. 4 capsule 0  . metoprolol succinate (TOPROL-XL) 25 MG 24 hr tablet Take 25 mg by mouth daily.      No current facility-administered medications on file prior to visit.     PAST MEDICAL HISTORY: Past Medical History:  Diagnosis Date  . Anxiety   . Bipolar disorder (Sherry Strong)   . Complication of anesthesia   . Depression   . GERD (gastroesophageal reflux disease)   . Heart murmur    echo- 08/30/15-epic   . History of hiatal hernia   . HLD (hyperlipidemia)   . HTN (hypertension)   . Infertility, female   . Menopause   .  PONV (postoperative nausea and vomiting)     PAST SURGICAL HISTORY: Past Surgical History:  Procedure Laterality Date  . CESAREAN SECTION    . CHOLECYSTECTOMY N/A 10/05/2017   Procedure: LAPAROSCOPIC CHOLECYSTECTOMY WITH INTRAOPERATIVE CHOLANGIOGRAM;  Surgeon: Alphonsa Overall, MD;  Location: [facility];  Service: General;  Laterality: N/A;  . LAPAROSCOPY      SOCIAL HISTORY: Social History   Tobacco Use  . Smoking status: Never Smoker  . Smokeless tobacco: Never Used  Substance Use Topics  . Alcohol use: Yes    Alcohol/week: 5.0 - 6.0 standard drinks    Types: 5 - 6 Cans of beer per week    Comment: occas   . Drug use: No    FAMILY HISTORY: Family History  Problem Relation Age of Onset  . Heart disease Mother   . Depression Mother   . Hyperlipidemia Father   . Hypertension Father   . Heart disease Father   . Cancer Father   . Depression Father   . Anxiety disorder Father   . Sleep apnea Father     ROS: Review of Systems  Constitutional: Negative for weight loss.  Gastrointestinal: Negative for nausea and vomiting.  Musculoskeletal:        Negative for muscle weakness  Psychiatric/Behavioral: Positive for depression. Negative for suicidal ideas.       +Stress    PHYSICAL EXAM: Blood pressure 125/84, pulse 96, temperature 98 F (36.7 C), temperature source Oral, height 5\' 4"  (1.626 m), weight 198 lb (89.8 kg), SpO2 92 %. Body mass index is 33.99 kg/m. Physical Exam Vitals signs reviewed.  Constitutional:      Appearance: Normal appearance. She is well-developed. She is obese.  Cardiovascular:     Rate and Rhythm: Normal rate.  Pulmonary:     Effort: Pulmonary effort is normal.  Musculoskeletal: Normal range of motion.  Skin:    General: Skin is warm and dry.  Neurological:     Mental Status: She is alert and oriented to person, place, and time.  Psychiatric:        Mood and Affect: Mood normal.        Behavior: Behavior normal.        Thought Content: Thought content does not include homicidal or suicidal ideation.     RECENT LABS AND TESTS: BMET    Component Value Date/Time   NA 144 05/08/2018 1246   K 4.4 05/08/2018 1246   CL 104 05/08/2018 1246   CO2 23 05/08/2018 1246   GLUCOSE 88 05/08/2018 1246   GLUCOSE 121 (H) 10/04/2017 1137   BUN 10 05/08/2018 1246   CREATININE 0.72 05/08/2018 1246   CALCIUM 9.3 05/08/2018 1246   GFRNONAA 93 05/08/2018 1246   GFRAA 108 05/08/2018 1246   Lab Results  Component Value Date   HGBA1C 5.5 05/08/2018   HGBA1C 5.4 07/12/2017   Lab Results  Component Value Date   INSULIN 8.8 05/08/2018   INSULIN 20.6 07/12/2017   CBC    Component Value Date/Time   WBC 7.2 10/04/2017 1137   RBC 3.90 10/04/2017 1137   HGB 12.7 10/04/2017 1137   HGB 13.1 07/12/2017 1055   HCT 38.3 10/04/2017 1137   HCT 40.1 07/12/2017 1055   PLT 237 10/04/2017 1137   MCV 98.2 10/04/2017 1137   MCV 99 (H) 07/12/2017 1055   MCH 32.6 10/04/2017 1137   MCHC 33.2 10/04/2017 1137   RDW 13.1 10/04/2017 1137   RDW 13.1 07/12/2017 1055   LYMPHSABS 1.2  10/04/2017 1137   LYMPHSABS 1.5  07/12/2017 1055   MONOABS 0.6 10/04/2017 1137   EOSABS 0.1 10/04/2017 1137   EOSABS 0.1 07/12/2017 1055   BASOSABS 0.0 10/04/2017 1137   BASOSABS 0.0 07/12/2017 1055   Iron/TIBC/Ferritin/ %Sat No results found for: IRON, TIBC, FERRITIN, IRONPCTSAT Lipid Panel     Component Value Date/Time   CHOL 220 (H) 05/08/2018 1246   TRIG 131 05/08/2018 1246   HDL 51 05/08/2018 1246   LDLCALC 143 (H) 05/08/2018 1246   Hepatic Function Panel     Component Value Date/Time   PROT 7.2 05/08/2018 1246   ALBUMIN 4.7 05/08/2018 1246   AST 23 05/08/2018 1246   ALT 28 05/08/2018 1246   ALKPHOS 65 05/08/2018 1246   BILITOT 0.3 05/08/2018 1246      Component Value Date/Time   TSH 2.420 07/12/2017 1055    Ref. Range 05/08/2018 12:46  Vitamin D, 25-Hydroxy Latest Ref Range: 30.0 - 100.0 ng/mL 29.4 (L)     OBESITY BEHAVIORAL INTERVENTION VISIT  Today's visit was # 23  Starting weight: 206 lbs Starting date: 07/12/2017 Today's weight : 198 lbs  Today's date: 09/09/2018 Total lbs lost to date: 8   ASK: We discussed the diagnosis of obesity with Sherry Strong MEQASTM today and Sherry Strong Strong to give [name] permission to discuss obesity behavioral modification therapy today.  ASSESS: Sherry Strong has the diagnosis of obesity and her BMI today is 33.97 Sherry Strong is in the action stage of change   ADVISE: Sherry Strong was educated on the multiple health risks of obesity as well as the benefit of weight loss to improve her health. She was advised of the need for long term treatment and the importance of lifestyle modifications to improve her current health and to decrease her risk of future health problems.  AGREE: Multiple dietary modification options and treatment options were discussed and  Sherry Strong to follow the recommendations documented in the above note.  ARRANGE: Sherry Strong was educated on the importance of frequent visits to treat obesity as outlined per CMS and USPSTF guidelines and Strong to schedule her next follow  up appointment today.  Corey Skains, am acting as Location manager for [employer]. Owens Shark, DO  I have reviewed the above documentation for accuracy and completeness, and I agree with the above. -Jearld Lesch, DO

## 2018-09-30 ENCOUNTER — Ambulatory Visit (INDEPENDENT_AMBULATORY_CARE_PROVIDER_SITE_OTHER): Payer: Medicare Other | Admitting: Bariatrics

## 2018-09-30 ENCOUNTER — Encounter (INDEPENDENT_AMBULATORY_CARE_PROVIDER_SITE_OTHER): Payer: Self-pay | Admitting: Bariatrics

## 2018-09-30 VITALS — BP 132/83 | HR 98 | Temp 97.7°F | Ht 64.0 in | Wt 200.0 lb

## 2018-09-30 DIAGNOSIS — E8881 Metabolic syndrome: Secondary | ICD-10-CM

## 2018-09-30 DIAGNOSIS — E669 Obesity, unspecified: Secondary | ICD-10-CM

## 2018-09-30 DIAGNOSIS — F3289 Other specified depressive episodes: Secondary | ICD-10-CM | POA: Diagnosis not present

## 2018-09-30 DIAGNOSIS — E559 Vitamin D deficiency, unspecified: Secondary | ICD-10-CM | POA: Diagnosis not present

## 2018-09-30 DIAGNOSIS — Z6834 Body mass index (BMI) 34.0-34.9, adult: Secondary | ICD-10-CM | POA: Diagnosis not present

## 2018-09-30 MED ORDER — VITAMIN D (ERGOCALCIFEROL) 1.25 MG (50000 UNIT) PO CAPS: 50000.0000 [IU] | ORAL_CAPSULE | ORAL | 0 refills | Status: DC

## 2018-10-01 NOTE — Progress Notes (Signed)
Office: [phone]  /  Fax: [phone]   HPI:   Chief Complaint: OBESITY Sherry Strong is here to discuss her progress with her obesity treatment plan. She is on the Category 2 plan and is following her eating plan approximately 40 % of the time. She states she is walking and doing weights 60 minutes 3 times per week. [name] is doing well overall. She gained some weight over the holidays. Her weight is 200 lb (90.7 kg) today and has had a weight loss of 2 pounds over a period of 3 weeks since her last visit. She has lost 6 lbs since starting treatment with [name].  Vitamin D deficiency Kataleia has a diagnosis of vitamin D deficiency. She is currently taking vit D and denies nausea, vomiting or muscle weakness.  Insulin Resistance Japleen has a diagnosis of insulin resistance based on her elevated fasting insulin level >5. Although Nahla's blood glucose readings are still under good control, insulin resistance puts her at greater risk of metabolic syndrome and diabetes. She retried metformin at the last visit (had prescription). Her last A1c was at 5.5 and last insulin level was at 8.8 and she continues to work on diet and exercise to decrease risk of diabetes.  Depression with emotional eating behaviors Kamirah is seeing a psychiatrist. She had been weaning off of [place]. Jaidyn struggles with emotional eating and using food for comfort to the extent that it is negatively impacting her health. She often snacks when she is not hungry. Kimbely sometimes feels she is out of control and then feels guilty that she made poor food choices. She has been working on behavior modification techniques to help reduce her emotional eating and has been somewhat successful. She shows no sign of suicidal or homicidal ideations.  Depression screen [facility] 2/9 06/12/2018 07/12/2017  Decreased Interest 3 3  Down, Depressed, Hopeless 3 3  PHQ - 2 Score 6 6  Altered sleeping 3 3  Tired, decreased energy 3 3  Change in appetite 2 3  Feeling  bad or failure about yourself  3 2  Trouble concentrating 3 3  Moving slowly or fidgety/restless 2 3  Suicidal thoughts 1 2  PHQ-9 Score 23 25  Difficult doing work/chores - Extremely dIfficult     ASSESSMENT AND PLAN:  Vitamin D deficiency - Plan: Vitamin D, Ergocalciferol, (DRISDOL) 1.25 MG (50000 UT) CAPS capsule  Insulin resistance  Other depression - with emotional eating  Class 1 obesity with serious comorbidity and body mass index (BMI) of 34.0 to 34.9 in adult, unspecified obesity type  PLAN:  Vitamin D Deficiency [name] was informed that low vitamin D levels contributes to fatigue and are associated with obesity, breast, and colon cancer. She agrees to continue to take prescription Vit D @50 ,000 IU every week #4 with no refills and will follow up for routine testing of vitamin D, at least 2-3 times per year. She was informed of the risk of over-replacement of vitamin D and agrees to not increase her dose unless she discusses this with [name] first. [name] agrees to follow up as directed.  Insulin Resistance [name] will continue to work on weight loss, exercise, and decreasing simple carbohydrates in her diet to help decrease the risk of diabetes. We dicussed metformin including benefits and risks. She was informed that eating too many simple carbohydrates or too many calories at one sitting increases the likelihood of GI side effects. [name] will continue metformin for now and prescription was not written today. [name] agreed  to follow up with [name] as directed to monitor her progress.  Depression with Emotional Eating Behaviors We discussed behavior modification techniques today to help [name] deal with her emotional eating and depression. She will continue with the psychiatrist (anticipate change in medications) and follow up as directed.  Obesity [name] is currently in the action stage of change. As such, her goal is to continue with weight loss efforts She has agreed to follow the Category 2  plan +100 calories with additional Category 1 and Category 2 options [name] has been instructed to work up to a goal of 150 minutes of combined cardio and strengthening exercise per week for weight loss and overall health benefits. We discussed the following Behavioral Modification Strategies today: increase H2O intake, no skipping meals, keeping healthy foods in the home, better snacking choices, increasing lean protein intake, decreasing simple carbohydrates, increasing vegetables and work on meal planning and easy cooking plans [name] will get back on her routine with meal planning and exercise.  [name] has agreed to follow up with our clinic in 2 weeks. She was informed of the importance of frequent follow up visits to maximize her success with intensive lifestyle modifications for her multiple health conditions.  ALLERGIES: Allergies  Allergen Reactions  . Vistaril  [Hydroxyzine Hcl] Swelling    MEDICATIONS: Current Outpatient Medications on File Prior to Visit  Medication Sig Dispense Refill  . ALPRAZolam (XANAX) 0.5 MG tablet Take 0.5 mg by mouth 2 (two) times daily as needed. For anxiety    . aspirin EC 81 MG tablet Take 81 mg by mouth.    . dexlansoprazole (DEXILANT) 60 MG capsule Take 60 mg by mouth daily.    . fenofibrate (TRICOR) 145 MG tablet Take 145 mg by mouth daily.  1  . ibuprofen (ADVIL,MOTRIN) 800 MG tablet Take 800 mg by mouth every 8 (eight) hours as needed for pain.    Marland Kitchen lamoTRIgine (LAMICTAL) 150 MG tablet Take 1 tablet (150 mg total) by mouth daily. 30 tablet 1  . LATUDA 80 MG TABS tablet TAKE 1 TABLET BY MOUTH EVERY DAY WITH BREAKFAST 90 tablet 0  . metFORMIN (GLUCOPHAGE) 500 MG tablet TAKE 1 TABLET BY MOUTH EVERY DAY WITH BREAKFAST 30 tablet 0  . pantoprazole (PROTONIX) 40 MG tablet Take 40 mg by mouth daily.    . pravastatin (PRAVACHOL) 40 MG tablet Take 40 mg by mouth every evening.    . vitamin C (ASCORBIC ACID) 500 MG tablet Take 500 mg by mouth daily.    .  metoprolol succinate (TOPROL-XL) 25 MG 24 hr tablet Take 25 mg by mouth daily.      No current facility-administered medications on file prior to visit.     PAST MEDICAL HISTORY: Past Medical History:  Diagnosis Date  . Anxiety   . Bipolar disorder ([place])   . Complication of anesthesia   . Depression   . GERD (gastroesophageal reflux disease)   . Heart murmur    echo- 08/30/15-epic   . History of hiatal hernia   . HLD (hyperlipidemia)   . HTN (hypertension)   . Infertility, female   . Menopause   . PONV (postoperative nausea and vomiting)     PAST SURGICAL HISTORY: Past Surgical History:  Procedure Laterality Date  . CESAREAN SECTION    . CHOLECYSTECTOMY N/A 10/05/2017   Procedure: LAPAROSCOPIC CHOLECYSTECTOMY WITH INTRAOPERATIVE CHOLANGIOGRAM;  Surgeon: Alphonsa Overall, MD;  Location: [facility];  Service: General;  Laterality: N/A;  . LAPAROSCOPY  SOCIAL HISTORY: Social History   Tobacco Use  . Smoking status: Never Smoker  . Smokeless tobacco: Never Used  Substance Use Topics  . Alcohol use: Yes    Alcohol/week: 5.0 - 6.0 standard drinks    Types: 5 - 6 Cans of beer per week    Comment: occas   . Drug use: No    FAMILY HISTORY: Family History  Problem Relation Age of Onset  . Heart disease Mother   . Depression Mother   . Hyperlipidemia Father   . Hypertension Father   . Heart disease Father   . Cancer Father   . Depression Father   . Anxiety disorder Father   . Sleep apnea Father     ROS: Review of Systems  Constitutional: Negative for weight loss.  Gastrointestinal: Negative for nausea and vomiting.  Musculoskeletal:       Negative for muscle weakness  Psychiatric/Behavioral: Positive for depression. Negative for suicidal ideas.    PHYSICAL EXAM: Blood pressure 132/83, pulse 98, temperature 97.7 F (36.5 C), temperature source Oral, height 5\' 4"  (1.626 m), weight 200 lb (90.7 kg), SpO2 98 %. Body mass index is 34.33 kg/m. Physical Exam Vitals  signs reviewed.  Constitutional:      Appearance: Normal appearance. She is well-developed. She is obese.  Cardiovascular:     Rate and Rhythm: Normal rate.  Pulmonary:     Effort: Pulmonary effort is normal.  Musculoskeletal: Normal range of motion.  Skin:    General: Skin is warm and dry.  Neurological:     Mental Status: She is alert and oriented to person, place, and time.  Psychiatric:        Mood and Affect: Mood normal.        Behavior: Behavior normal.        Thought Content: Thought content does not include homicidal or suicidal ideation.     RECENT LABS AND TESTS: BMET    Component Value Date/Time   NA 144 05/08/2018 1246   K 4.4 05/08/2018 1246   CL 104 05/08/2018 1246   CO2 23 05/08/2018 1246   GLUCOSE 88 05/08/2018 1246   GLUCOSE 121 (H) 10/04/2017 1137   BUN 10 05/08/2018 1246   CREATININE 0.72 05/08/2018 1246   CALCIUM 9.3 05/08/2018 1246   GFRNONAA 93 05/08/2018 1246   GFRAA 108 05/08/2018 1246   Lab Results  Component Value Date   HGBA1C 5.5 05/08/2018   HGBA1C 5.4 07/12/2017   Lab Results  Component Value Date   INSULIN 8.8 05/08/2018   INSULIN 20.6 07/12/2017   CBC    Component Value Date/Time   WBC 7.2 10/04/2017 1137   RBC 3.90 10/04/2017 1137   HGB 12.7 10/04/2017 1137   HGB 13.1 07/12/2017 1055   HCT 38.3 10/04/2017 1137   HCT 40.1 07/12/2017 1055   PLT 237 10/04/2017 1137   MCV 98.2 10/04/2017 1137   MCV 99 (H) 07/12/2017 1055   MCH 32.6 10/04/2017 1137   MCHC 33.2 10/04/2017 1137   RDW 13.1 10/04/2017 1137   RDW 13.1 07/12/2017 1055   LYMPHSABS 1.2 10/04/2017 1137   LYMPHSABS 1.5 07/12/2017 1055   MONOABS 0.6 10/04/2017 1137   EOSABS 0.1 10/04/2017 1137   EOSABS 0.1 07/12/2017 1055   BASOSABS 0.0 10/04/2017 1137   BASOSABS 0.0 07/12/2017 1055   Iron/TIBC/Ferritin/ %Sat No results found for: IRON, TIBC, FERRITIN, IRONPCTSAT Lipid Panel     Component Value Date/Time   CHOL 220 (H) 05/08/2018 1246  TRIG 131 05/08/2018  1246   HDL 51 05/08/2018 1246   LDLCALC 143 (H) 05/08/2018 1246   Hepatic Function Panel     Component Value Date/Time   PROT 7.2 05/08/2018 1246   ALBUMIN 4.7 05/08/2018 1246   AST 23 05/08/2018 1246   ALT 28 05/08/2018 1246   ALKPHOS 65 05/08/2018 1246   BILITOT 0.3 05/08/2018 1246      Component Value Date/Time   TSH 2.420 07/12/2017 1055    Ref. Range 05/08/2018 12:46  Vitamin D, 25-Hydroxy Latest Ref Range: 30.0 - 100.0 ng/mL 29.4 (L)     OBESITY BEHAVIORAL INTERVENTION VISIT  Today's visit was # 24  Starting weight: 206 lbs Starting date: 07/12/2017 Today's weight : 200 lbs  Today's date: 09/30/2018 Total lbs lost to date: 6 At least 15 minutes were spent on discussing the following behavioral intervention visit.   ASK: We discussed the diagnosis of obesity with Jenniferann [facility] today and Kyriana agreed to give [name] permission to discuss obesity behavioral modification therapy today.  ASSESS: Verdella has the diagnosis of obesity and her BMI today is 34.31 Marissa is in the action stage of change   ADVISE: Tyeisha was educated on the multiple health risks of obesity as well as the benefit of weight loss to improve her health. She was advised of the need for long term treatment and the importance of lifestyle modifications to improve her current health and to decrease her risk of future health problems.  AGREE: Multiple dietary modification options and treatment options were discussed and  Noni agreed to follow the recommendations documented in the above note.  ARRANGE: Amberli was educated on the importance of frequent visits to treat obesity as outlined per CMS and USPSTF guidelines and agreed to schedule her next follow up appointment today.  Corey Skains, am acting as Location manager for [employer]. Owens Shark, DO  I have reviewed the above documentation for accuracy and completeness, and I agree with the above. -Jearld Lesch, DO

## 2018-10-14 ENCOUNTER — Ambulatory Visit (INDEPENDENT_AMBULATORY_CARE_PROVIDER_SITE_OTHER): Payer: Self-pay | Admitting: Bariatrics

## 2019-02-13 IMAGING — US US ABDOMEN COMPLETE
1 series · 14 of 25 positions shown · non-contrast
Comparison: None.

CLINICAL DATA: Right upper quadrant pain for 3 days.

EXAM:
ABDOMEN ULTRASOUND COMPLETE

[Series 1: us abdomen complete · 0.22mm/px · 14 of 105 slices shown]
[im 1/105]
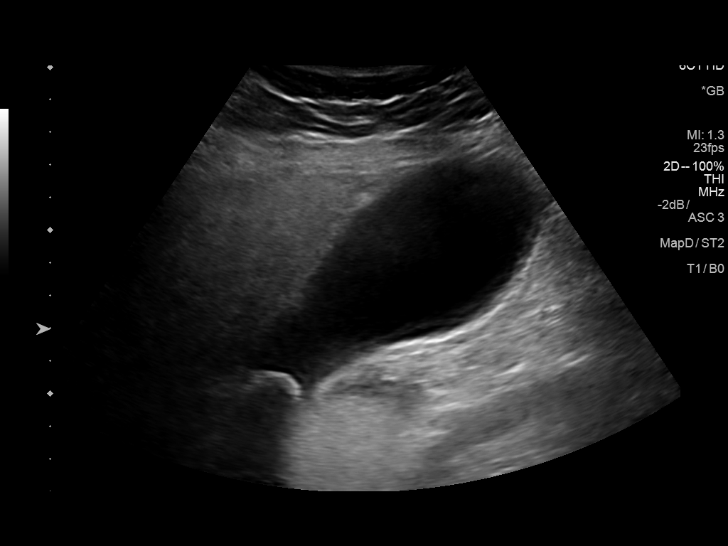
[im 9/105]
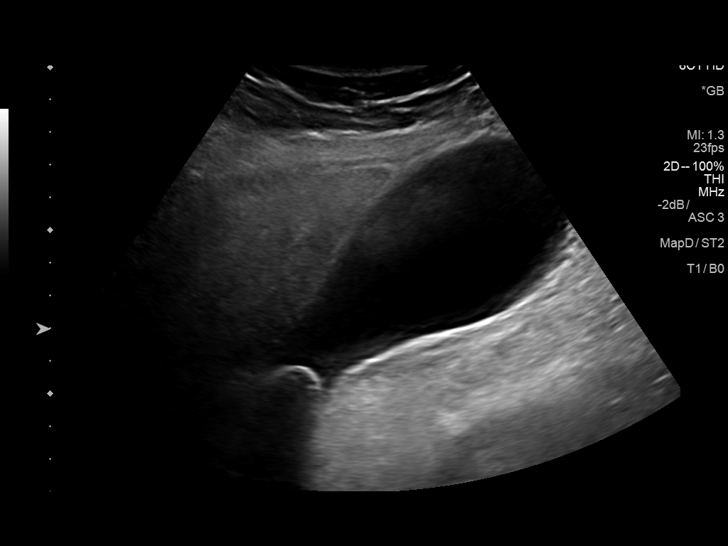
[im 18/105]
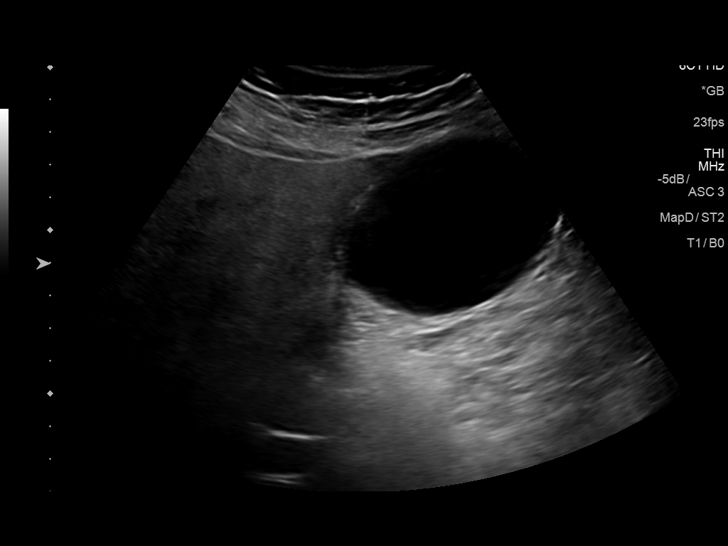
[im 27/105]
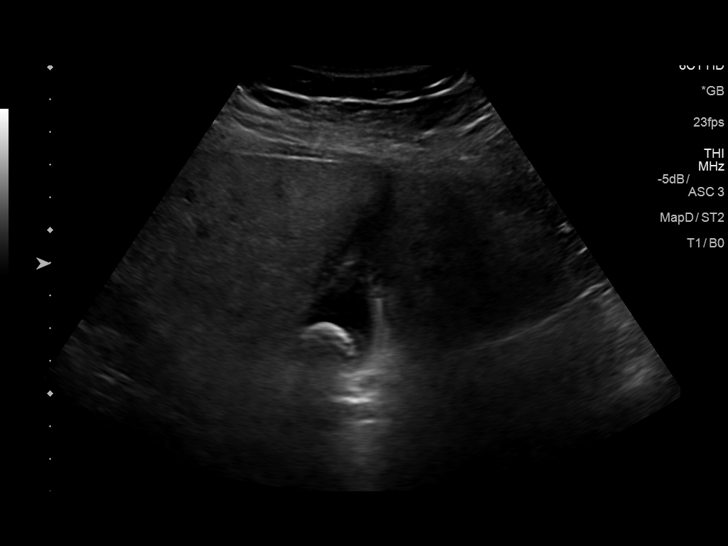
[im 35/105]
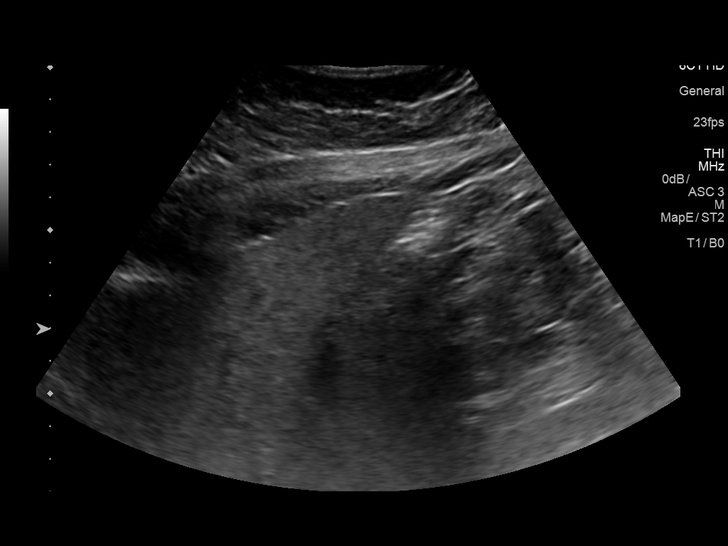
[im 40/105]
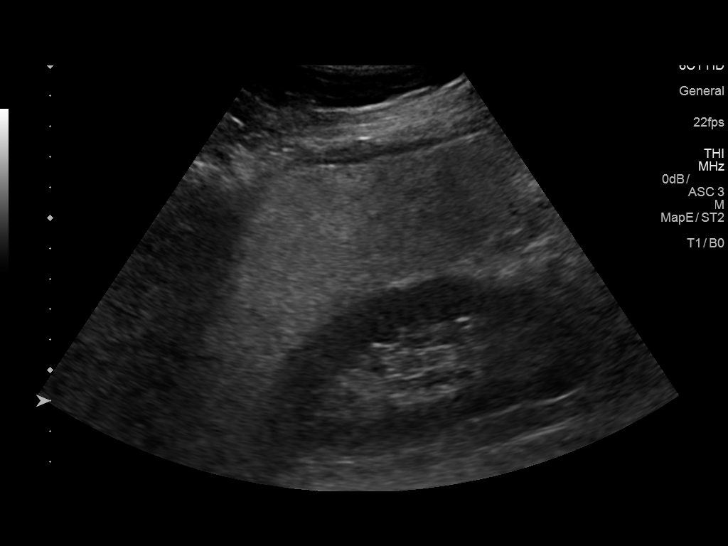
[im 48/105]
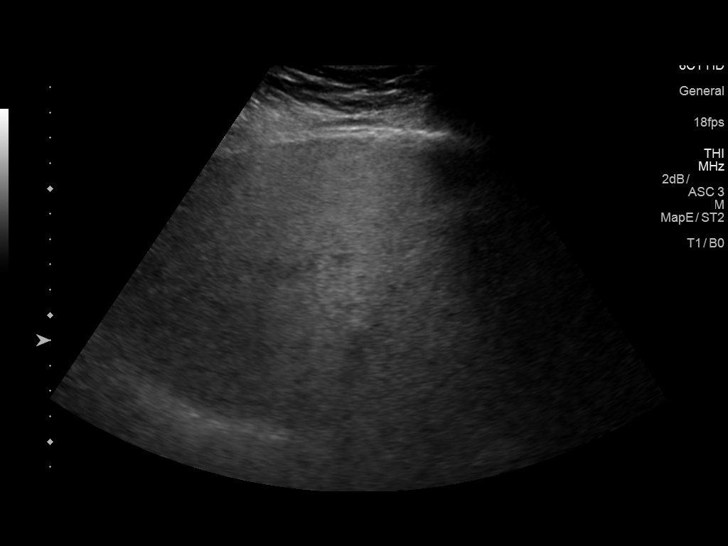
[im 57/105]
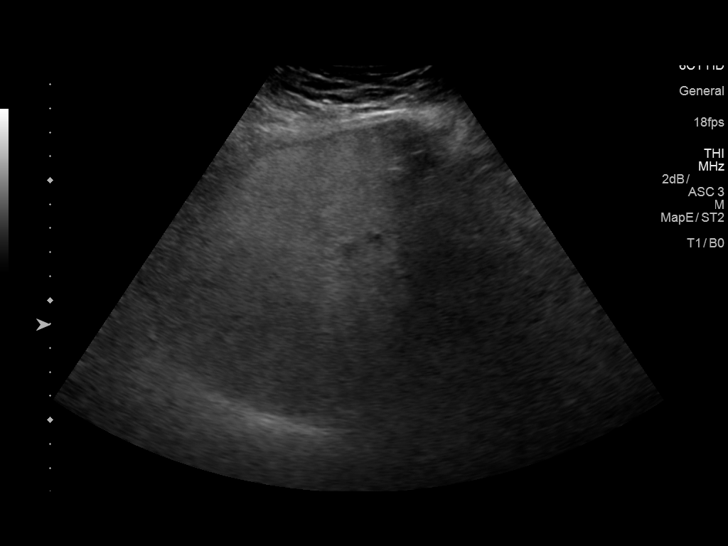
[im 66/105]
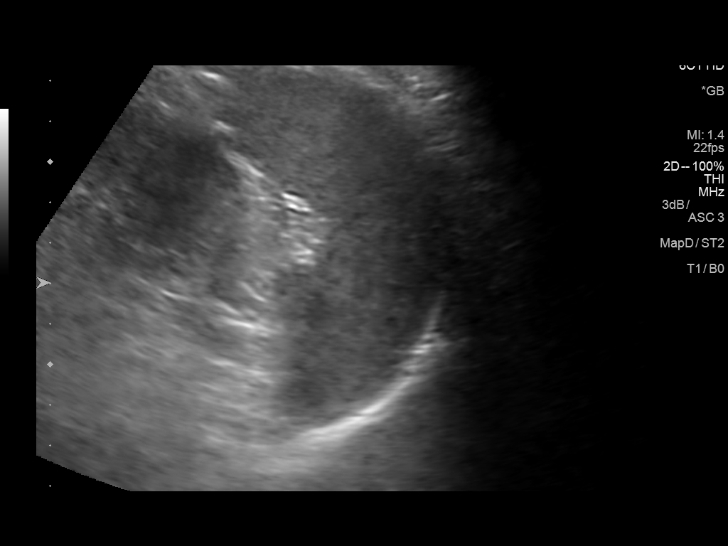
[im 70/105]
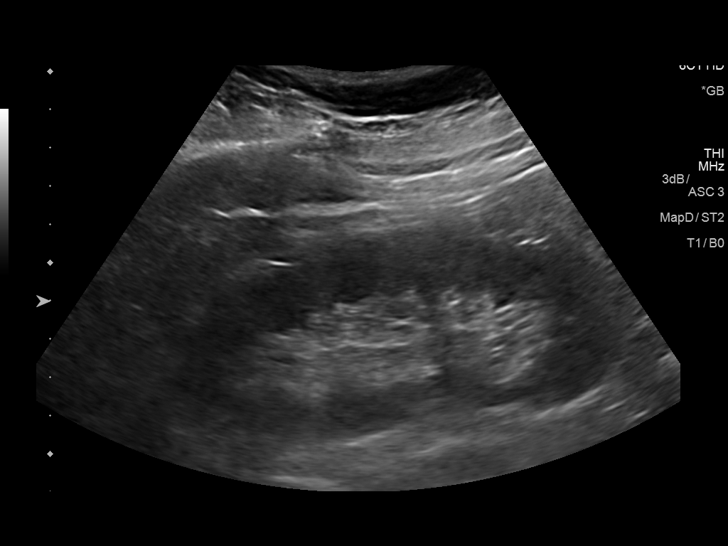
[im 79/105]
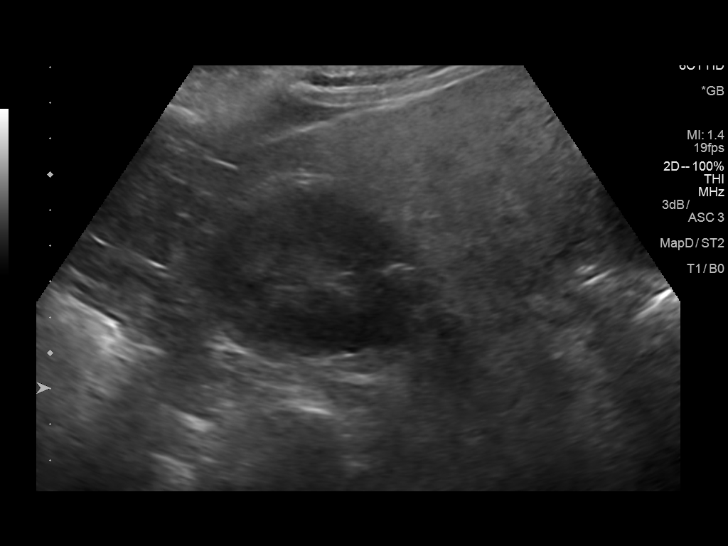
[im 87/105]
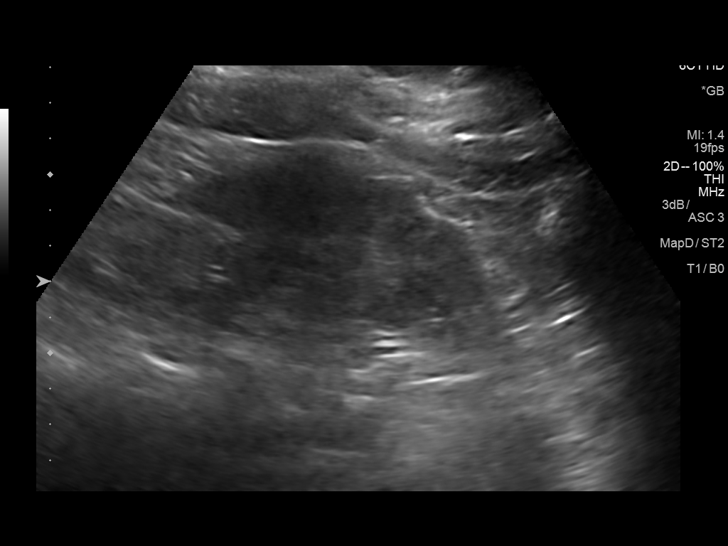
[im 96/105]
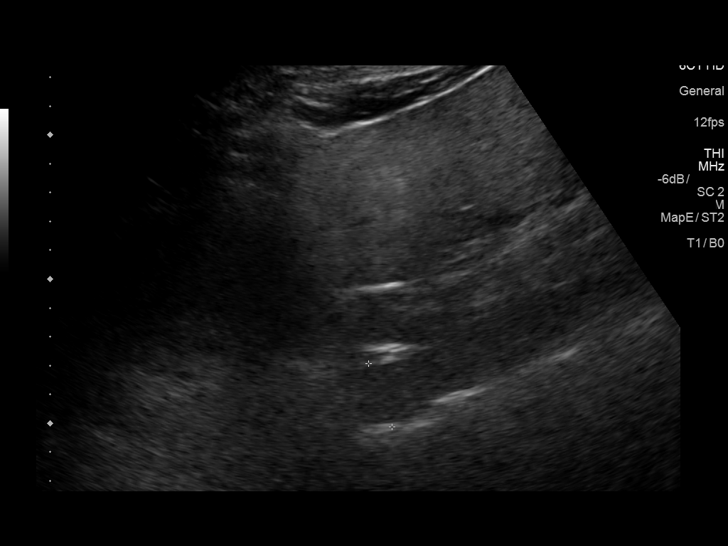
[im 105/105]
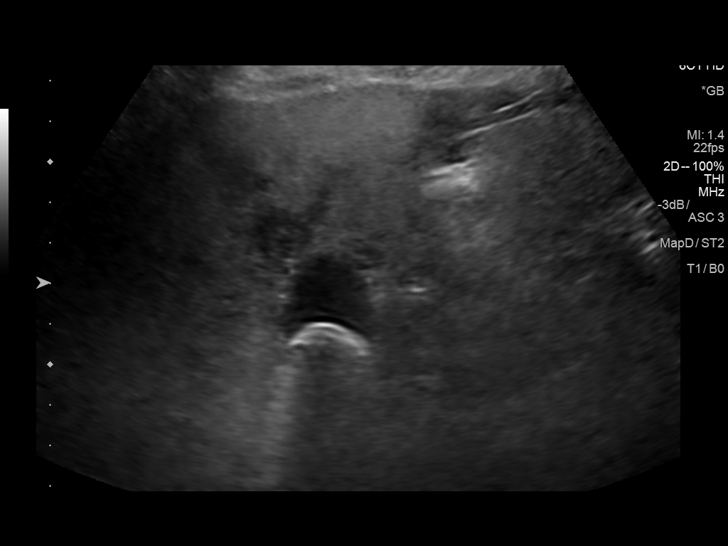

[14 of 25 positions shown; findings below may reference images not displayed]

FINDINGS: Gallbladder: There is a 1.8 cm stone within the gallbladder neck. No
evidence of gallbladder wall thickening. The gallbladder wall
measures 2.2 mm. The sonographic Murphy's sign was reported as
negative.

Common bile duct: Diameter: 3.9 mm

Liver: No focal lesion identified. Within normal limits in
parenchymal echogenicity. Portal vein is patent on color Doppler
imaging with normal direction of blood flow towards the liver.

IVC: No abnormality visualized.

Pancreas: Obscured by bowel gas.

Spleen: Size and appearance within normal limits.

Right Kidney: Length: 12.0 cm. Echogenicity within normal limits. No
mass or hydronephrosis visualized.

Left Kidney: Length: 12.6 cm. Echogenicity within normal limits. No
mass or hydronephrosis visualized.

Abdominal aorta: No aneurysm visualized.

Other findings: None.
IMPRESSION: 1.8 cm non mobile gallstone within the gallbladder neck, without
secondary signs of acute cholecystitis.

Diffusely increased echogenicity of the liver, usually associated
with hepatic steatosis.

## 2020-01-26 ENCOUNTER — Encounter: Payer: Self-pay | Admitting: Radiology

## 2020-01-28 ENCOUNTER — Other Ambulatory Visit: Payer: Self-pay | Admitting: Nurse Practitioner

## 2020-01-28 DIAGNOSIS — R1030 Lower abdominal pain, unspecified: Secondary | ICD-10-CM

## 2020-01-28 DIAGNOSIS — N939 Abnormal uterine and vaginal bleeding, unspecified: Secondary | ICD-10-CM

## 2020-02-12 ENCOUNTER — Encounter: Payer: Self-pay | Admitting: Obstetrics and Gynecology

## 2020-02-12 ENCOUNTER — Ambulatory Visit (INDEPENDENT_AMBULATORY_CARE_PROVIDER_SITE_OTHER): Payer: Medicare Other | Admitting: Obstetrics and Gynecology

## 2020-02-12 ENCOUNTER — Other Ambulatory Visit: Payer: Self-pay | Admitting: Obstetrics and Gynecology

## 2020-02-12 ENCOUNTER — Other Ambulatory Visit: Payer: Self-pay

## 2020-02-12 ENCOUNTER — Other Ambulatory Visit (HOSPITAL_COMMUNITY)
Admission: RE | Admit: 2020-02-12 | Discharge: 2020-02-12 | Disposition: A | Discharge status: Home / Self Care | Payer: Medicare Other | Source: Ambulatory Visit | Attending: Obstetrics and Gynecology | Admitting: Obstetrics and Gynecology

## 2020-02-12 VITALS — BP 136/86 | HR 96 | Ht 63.0 in | Wt 218.0 lb

## 2020-02-12 DIAGNOSIS — N950 Postmenopausal bleeding: Secondary | ICD-10-CM

## 2020-02-12 HISTORY — PX: ENDOMETRIAL BIOPSY: PRO73

## 2020-02-12 NOTE — Progress Notes (Signed)
Obstetrics and Gynecology New Patient Evaluation  Appointment Date: 02/12/2020  OBGYN Clinic: [facility]  Primary Care Provider: Berkley Harvey  Referring Provider: Berkley Harvey, NP  Chief Complaint: Post menopausal spotting  History of Present Illness: Sherry Strong is a 59 y.o. Caucasian [phone] (LMP: ?6 years ago), seen for the above chief complaint. Her past medical history is significant for HTN, BMI 30s, infertility  Patient states that starting since November 2020 she's had nearly daily pink spotting/d/c with wiping that has been stable. She has rare low belly pressure/cramping. No vaginal itching, irration.  She had a normal pap, hpv and wet prep with PCP in March 2021  Review of Systems: Pertinent items noted in HPI and remainder of comprehensive ROS otherwise negative.   Patient Active Problem List   Diagnosis Date Noted  . Class 1 obesity with serious comorbidity and body mass index (BMI) of 33.0 to 33.9 in adult 08/15/2018  . Cholecystitis, acute with cholelithiasis 10/05/2017  . Insulin resistance 08/15/2017  . Other fatigue 07/12/2017  . Shortness of breath on exertion 07/12/2017  . Essential hypertension 07/12/2017  . Other hyperlipidemia 07/12/2017  . B12 nutritional deficiency 07/12/2017  . Bipolar depression ([place]) 07/12/2017  . Vitamin D deficiency 07/12/2017  . Tremor 12/14/2014  . Gastroesophageal reflux disease without esophagitis 08/10/2014  . HYPERCHOLESTEROLEMIA, PURE 10/21/2008  . GERD 10/21/2008    Past Medical History:  Past Medical History:  Diagnosis Date  . Anxiety   . Bipolar disorder ([place])   . Complication of anesthesia   . Depression   . GERD (gastroesophageal reflux disease)   . Heart murmur    echo- 08/30/15-epic   . History of hiatal hernia   . HLD (hyperlipidemia)   . HTN (hypertension)   . Infertility, female   . Menopause   . PONV (postoperative nausea and vomiting)     Past Surgical  History:  Past Surgical History:  Procedure Laterality Date  . CESAREAN SECTION    . CHOLECYSTECTOMY N/A 10/05/2017   Procedure: LAPAROSCOPIC CHOLECYSTECTOMY WITH INTRAOPERATIVE CHOLANGIOGRAM;  Surgeon: Alphonsa Overall, MD;  Location: [facility];  Service: General;  Laterality: N/A;  . LAPAROSCOPY      Past Obstetrical History:  OB History  Gravida Para Term Preterm AB Living  4 1 1   3 1   SAB TAB Ectopic Multiple Live Births  3       1    # Outcome Date GA Lbr Len/2nd Weight Sex Delivery Anes PTL Lv  4 Term           3 SAB           2 SAB           1 SAB             Obstetric Comments  c-section x 1    Past Gynecological History: As per HPI. History of HRT use: No.  Social History:  Social History   Socioeconomic History  . Marital status: Divorced    Spouse name: Not on file  . Number of children: Not on file  . Years of education: Not on file  . Highest education level: Not on file  Occupational History  . 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 Smoking status: Never Smoker  . Smokeless tobacco: Never Used  Substance and Sexual Activity  . Alcohol use: Yes    Alcohol/week: 5.0 - 6.0 standard drinks    Types: 5 - 6  Cans of beer per week    Comment: occas   . Drug use: No  . Sexual activity: Not Currently    Birth control/protection: None  Other Topics Concern  . Not on file  Social History Narrative  . Not on file   Social Determinants of Health   Financial Resource Strain:   . Difficulty of Paying Living Expenses:   Food Insecurity:   . Worried About Charity fundraiser in the Last Year:   . Arboriculturist in the Last Year:   Transportation Needs:   . Film/video editor (Medical):   Marland Kitchen Lack of Transportation (Non-Medical):   Physical Activity:   . Days of Exercise per Week:   . Minutes of Exercise per Session:   Stress:   . Feeling of Stress :   Social Connections:   . Frequency of Communication with Friends and Family:   . Frequency of  Social Gatherings with Friends and Family:   . Attends Religious Services:   . Active Member of Clubs or Organizations:   . Attends Archivist Meetings:   Marland Kitchen Marital Status:   Intimate Partner Violence:   . Fear of Current or Ex-Partner:   . Emotionally Abused:   Marland Kitchen Physically Abused:   . Sexually Abused:     Family History:  Family History  Problem Relation Age of Onset  . Heart disease Mother   . Depression Mother   . Hyperlipidemia Father   . Hypertension Father   . Heart disease Father   . Cancer Father   . Depression Father   . Anxiety disorder Father   . Sleep apnea Father     Medications Shiralee Gruetzmacher had no medications administered during this visit. Current Outpatient Medications  Medication Sig Dispense Refill  . ALPRAZolam (XANAX) 0.5 MG tablet Take 0.5 mg by mouth 2 (two) times daily as needed. For anxiety    . aspirin EC 81 MG tablet Take 81 mg by mouth.    . dexlansoprazole (DEXILANT) 60 MG capsule Take 60 mg by mouth daily.    . fenofibrate (TRICOR) 145 MG tablet Take 145 mg by mouth daily.  1  . lamoTRIgine (LAMICTAL) 150 MG tablet Take 1 tablet (150 mg total) by mouth daily. 30 tablet 1  . LATUDA 80 MG TABS tablet TAKE 1 TABLET BY MOUTH EVERY DAY WITH BREAKFAST 90 tablet 0  . pantoprazole (PROTONIX) 40 MG tablet Take 40 mg by mouth daily.    . pravastatin (PRAVACHOL) 40 MG tablet Take 40 mg by mouth every evening.    . vitamin C (ASCORBIC ACID) 500 MG tablet Take 500 mg by mouth daily.    . Vitamin D, Ergocalciferol, (DRISDOL) 1.25 MG (50000 UT) CAPS capsule Take 1 capsule (50,000 Units total) by mouth every 7 (seven) days. 4 capsule 0  . ibuprofen (ADVIL,MOTRIN) 800 MG tablet Take 800 mg by mouth every 8 (eight) hours as needed for pain.    . metFORMIN (GLUCOPHAGE) 500 MG tablet TAKE 1 TABLET BY MOUTH EVERY DAY WITH BREAKFAST (Patient not taking: Reported on 02/12/2020) 30 tablet 0  . metoprolol succinate (TOPROL-XL) 25 MG 24 hr tablet Take 25 mg  by mouth daily.      No current facility-administered medications for this visit.    Allergies Vistaril  [hydroxyzine hcl]   Physical Exam:  BP 136/86   Pulse 96   Ht 5\' 3"  (1.6 m)   Wt 218 lb (98.9 kg)   LMP  01/25/2011   BMI 38.62 kg/m  Body mass index is 38.62 kg/m. General appearance: Well nourished, well developed female in no acute distress.  Neck:  Supple, normal appearance, and no thyromegaly  Cardiovascular: normal s1 and s2.  No murmurs, rubs or gallops. Respiratory:  Clear to auscultation bilateral. Normal respiratory effort Abdomen: positive bowel sounds and no masses, hernias; diffusely non tender to palpation, non distended Neuro/Psych:  Normal mood and affect.  Skin:  Warm and dry.  Lymphatic:  No inguinal lymphadenopathy.   Pelvic exam: is not limited by body habitus EGBUS: within normal limits Vagina: within normal limits and with no blood or discharge in the vault Cervix: normal appearing cervix without tenderness, discharge or lesions. Uterus:  nonenlarged and non tender Adnexa:  normal adnexa and no mass, fullness, tenderness Rectovaginal: deferred  Laboratory: None  Radiology: None  Assessment: pt stable  Plan:  Follow up embx and u/s findings  RTC after u/s results.   Durene Romans MD Attending Center for [employer] Fish farm manager)

## 2020-02-12 NOTE — Progress Notes (Signed)
Having light pink discharge since Nov 2020. Last period was 6 years ago.   Referred by primary care, had annual exam and has current PAP

## 2020-02-12 NOTE — Procedures (Signed)
Endometrial Biopsy Procedure Note  Pre-operative Diagnosis: PMB  Post-operative Diagnosis: PMB  Procedure Details  Urine pregnancy test was not done.  The risks (including infection, bleeding, pain, and uterine perforation) and benefits of the procedure were explained to the patient and Written informed consent was obtained.  The patient was placed in the dorsal lithotomy position.  Bimanual exam showed the uterus to be in the neutral position.  A Pederson speculum inserted in the vagina, and the cervix was visualized. The cervix was then prepped with povidone iodine, and a sharp tenaculum was applied to the anterior lip of the cervix for stabilization, but I couldn't enter due to slippage so it was then placed on the posterior lip.  A pipelle was inserted into the uterine cavity and sounded the uterus to a depth of 8cm.  A Moderate amount of tissue was collected after 1 passes. The sample was sent for pathologic examination.  Condition: Stable  Complications: None  Plan: The patient was advised to call for any fever or for prolonged or severe pain or bleeding. She was advised to use OTC analgesics as needed for mild to moderate pain. She was advised to avoid vaginal intercourse for 48 hours or until the bleeding has completely stopped.  Durene Romans MD Attending Center for [employer] [facility] [employer] Fish farm manager)

## 2020-02-13 ENCOUNTER — Telehealth: Payer: Self-pay | Admitting: Obstetrics and Gynecology

## 2020-02-13 DIAGNOSIS — N950 Postmenopausal bleeding: Secondary | ICD-10-CM

## 2020-02-13 DIAGNOSIS — C55 Malignant neoplasm of uterus, part unspecified: Secondary | ICD-10-CM

## 2020-02-13 LAB — CERVICOVAGINAL ANCILLARY ONLY
Bacterial Vaginitis (gardnerella): NEGATIVE
Candida Glabrata: NEGATIVE
Candida Vaginitis: NEGATIVE
Chlamydia: NEGATIVE
Comment: NEGATIVE
Comment: NEGATIVE
Comment: NEGATIVE
Comment: NEGATIVE
Comment: NEGATIVE
Comment: NORMAL
Neisseria Gonorrhea: NEGATIVE
Trichomonas: NEGATIVE

## 2020-02-13 NOTE — Telephone Encounter (Signed)
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  Patient called at [phone] and VM left stating I'll try again later today to call her with the results  Durene Romans MD Attending Center for [place] (Faculty Practice) 02/13/2020 Time: [phone]

## 2020-02-13 NOTE — Telephone Encounter (Signed)
GYN Telephone Note  I was able to get in touch with Ms. Pastrano and d/w her re: the embx findings. Referral made to GYN Oncology  Durene Romans MD Attending [facility] for [place] (Faculty Practice) 02/13/2020 Time: 1800

## 2020-02-17 ENCOUNTER — Telehealth: Payer: Self-pay | Admitting: *Deleted

## 2020-02-17 NOTE — Patient Instructions (Addendum)
Preparing for your Surgery  Plan for surgery on March 08, 2020 with Dr. Everitt Amber at [facility]. You will be scheduled for a robotic assisted total laparoscopic hysterectomy, bilateral salpingo-oophorectomy, sentinel lymph node biopsy, possible lymph node dissection.   Pre-operative Testing -You will receive a phone call from presurgical testing at [facility] to arrange for a pre-operative appointment over the phone, lab appointment, and COVID test. The COVID test normally happens 3 days prior to the surgery and they ask that you self quarantine after the test up until surgery to decrease chance of exposure.  -Bring your insurance card, copy of an advanced directive if applicable, medication list  -At that visit, you will be asked to sign a consent for a possible blood transfusion in case a transfusion becomes necessary during surgery.  The need for a blood transfusion is rare but having consent is a necessary part of your care.     -You should not be taking blood thinners or aspirin at least ten days prior to surgery unless instructed by your surgeon. DO NOT START TAKING ASPIRIN.  -Do not take supplements such as fish oil (omega 3), red yeast rice, turmeric before your surgery.   Day Before Surgery at [place] will be asked to take in a light diet the day before surgery.  Avoid carbonated beverages.  You will be advised you can have clear liquids after midnight and up until 3 hours before your surgery.    Eat a light diet the day before surgery.  Examples including soups, broths, toast, yogurt, mashed potatoes.  AVOID ITEMS THAT CAUSE GAS. Things to avoid include carbonated beverages (fizzy beverages), raw fruits and raw vegetables, or beans.   If your bowels are filled with gas, your surgeon will have difficulty visualizing your pelvic organs which increases your surgical risks.  Your role in recovery Your role is to become active as soon as directed by your  doctor, while still giving yourself time to heal.  Rest when you feel tired. You will be asked to do the following in order to speed your recovery:  - Cough and breathe deeply. This helps to clear and expand your lungs and can prevent pneumonia after surgery.  - [place]. Do mild physical activity. Walking or moving your legs help your circulation and body functions return to normal. Do not try to get up or walk alone the first time after surgery.   -If you develop swelling on one leg or the other, pain in the back of your leg, redness/warmth in one of your legs, please call the office or go to the Emergency Room to have a doppler to rule out a blood clot. For shortness of breath, chest pain-seek care in the Emergency Room as soon as possible.  - Actively manage your pain. Managing your pain lets you move in comfort. We will ask you to rate your pain on a scale of zero to 10. It is your responsibility to tell your doctor or nurse where and how much you hurt so your pain can be treated.  Special Considerations -If you are diabetic, you may be placed on insulin after surgery to have closer control over your blood sugars to promote healing and recovery.  This does not mean that you will be discharged on insulin.  If applicable, your oral antidiabetics will be resumed when you are tolerating a solid diet.  -Your final pathology results from surgery should be  available around one week after surgery and the results will be relayed to you when available.  -FMLA forms can be faxed to [phone] and please allow 5-7 business days for completion.  Pain Management After Surgery -You will be prescribed your pain medication and bowel regimen medications before surgery so that you can have these available when you are discharged from the hospital. The pain medication is for use ONLY AFTER surgery and a new prescription will not be given.   -Make sure that you have Tylenol and Ibuprofen at  home to use on a regular basis after surgery for pain control. We recommend alternating the medications every hour to six hours since they work differently and are processed in the body differently for pain relief.  -Review the attached handout on narcotic use and their risks and side effects.   Bowel Regimen -You will be prescribed Sennakot-S to take nightly to prevent constipation especially if you are taking the narcotic pain medication intermittently.  It is important to prevent constipation and drink adequate amounts of liquids. You can stop taking this medication when you are not taking pain medication and you are back on your normal bowel routine.  Risks of Surgery Risks of surgery are low but include bleeding, infection, damage to surrounding structures, re-operation, blood clots, and very rarely death.   Blood Transfusion Information (For the consent to be signed before surgery)  We will be checking your blood type before surgery so in case of emergencies, we will know what type of blood you would need.                                            WHAT IS A BLOOD TRANSFUSION?  A transfusion is the replacement of blood or some of its parts. Blood is made up of multiple cells which provide different functions.  Red blood cells carry oxygen and are used for blood loss replacement.  White blood cells fight against infection.  Platelets control bleeding.  Plasma helps clot blood.  Other blood products are available for specialized needs, such as hemophilia or other clotting disorders. BEFORE THE TRANSFUSION  Who gives blood for transfusions?   You may be able to donate blood to be used at a later date on yourself (autologous donation).  Relatives can be asked to donate blood. This is generally not any safer than if you have received blood from a stranger. The same precautions are taken to ensure safety when a relative's blood is donated.  Healthy volunteers who are fully  evaluated to make sure their blood is safe. This is blood bank blood. Transfusion therapy is the safest it has ever been in the practice of medicine. Before blood is taken from a donor, a complete history is taken to make sure that person has no history of diseases nor engages in risky social behavior (examples are intravenous drug use or sexual activity with multiple partners). The donor's travel history is screened to minimize risk of transmitting infections, such as malaria. The donated blood is tested for signs of infectious diseases, such as HIV and hepatitis. The blood is then tested to be sure it is compatible with you in order to minimize the chance of a transfusion reaction. If you or a relative donates blood, this is often done in anticipation of surgery and is not appropriate for emergency situations. It takes many days  to process the donated blood. RISKS AND COMPLICATIONS Although transfusion therapy is very safe and saves many lives, the main dangers of transfusion include:   Getting an infectious disease.  Developing a transfusion reaction. This is an allergic reaction to something in the blood you were given. Every precaution is taken to prevent this. The decision to have a blood transfusion has been considered carefully by your caregiver before blood is given. Blood is not given unless the benefits outweigh the risks.  AFTER SURGERY INSTRUCTIONS  Return to work: 4-6 weeks if applicable  Activity: 1. Be up and out of the bed during the day.  Take a nap if needed.  You may walk up steps but be careful and use the hand rail.  Stair climbing will tire you more than you think, you may need to stop part way and rest.   2. No lifting or straining for 6 weeks over 10 pounds. No pushing, pulling, straining for 6 weeks.  3. No driving for 1 week(s).  Do not drive if you are taking narcotic pain medicine and make sure that your reaction time has returned.   4. You can shower as soon as the  next day after surgery. Shower daily.  Use soap and water on your incision and pat dry; don't rub.  No tub baths or submerging your body in water until cleared by your surgeon. If you have the soap that was given to you by pre-surgical testing that was used before surgery, you do not need to use it afterwards because this can irritate your incisions.   5. No sexual activity and nothing in the vagina for 8 weeks.  6. You may experience a small amount of clear drainage from your incisions, which is normal.  If the drainage persists, increases, or changes color please call the office.  7. Do not use creams, lotions, or ointments such as neosporin on your incisions after surgery until advised by your surgeon because they can cause removal of the dermabond glue on your incisions.    8. You may experience vaginal spotting after surgery or around the 6-8 week mark from surgery when the stitches at the top of the vagina begin to dissolve.  The spotting is normal but if you experience heavy bleeding, call our office.  9. Take Tylenol or ibuprofen first for pain and only use narcotic pain medication for severe pain not relieved by the Tylenol or Ibuprofen.  Monitor your Tylenol intake to a max of 4,000 mg.  Diet: 1. Low sodium Heart Healthy Diet is recommended.  2. It is safe to use a laxative, such as Miralax or Colace, if you have difficulty moving your bowels. You can take Sennakot at bedtime every evening to keep bowel movements regular and to prevent constipation.    Wound Care: 1. Keep clean and dry.  Shower daily.  Reasons to call the Doctor:  Fever - Oral temperature greater than 100.4 degrees Fahrenheit  Foul-smelling vaginal discharge  Difficulty urinating  Nausea and vomiting  Increased pain at the site of the incision that is unrelieved with pain medicine.  Difficulty breathing with or without chest pain  New calf pain especially if only on one side  Sudden, continuing increased  vaginal bleeding with or without clots.   Contacts: For questions or concerns you should contact:  Dr. Everitt Amber at [phone]  Joylene John, NP at [phone]  After Hours: call [phone] and have the GYN Oncologist paged/contacted

## 2020-02-17 NOTE — Telephone Encounter (Signed)
Called the patient and schedule an appt for tomorrow with Dr Denman George. Gave the address and phone number for the clinic. Also gave the policy for mask, visitors and parking

## 2020-02-18 ENCOUNTER — Encounter: Payer: Self-pay | Admitting: Gynecologic Oncology

## 2020-02-18 ENCOUNTER — Inpatient Hospital Stay: Payer: Medicare Other | Attending: Gynecologic Oncology | Admitting: Gynecologic Oncology

## 2020-02-18 ENCOUNTER — Other Ambulatory Visit: Payer: Self-pay | Admitting: Gynecologic Oncology

## 2020-02-18 ENCOUNTER — Other Ambulatory Visit: Payer: Self-pay

## 2020-02-18 VITALS — BP 125/72 | HR 73 | Temp 98.5°F | Resp 17 | Ht 63.0 in | Wt 215.0 lb

## 2020-02-18 DIAGNOSIS — Z7984 Long term (current) use of oral hypoglycemic drugs: Secondary | ICD-10-CM

## 2020-02-18 DIAGNOSIS — E7800 Pure hypercholesterolemia, unspecified: Secondary | ICD-10-CM

## 2020-02-18 DIAGNOSIS — Z6838 Body mass index (BMI) 38.0-38.9, adult: Secondary | ICD-10-CM | POA: Diagnosis not present

## 2020-02-18 DIAGNOSIS — C55 Malignant neoplasm of uterus, part unspecified: Secondary | ICD-10-CM

## 2020-02-18 DIAGNOSIS — E669 Obesity, unspecified: Secondary | ICD-10-CM

## 2020-02-18 DIAGNOSIS — Z8349 Family history of other endocrine, nutritional and metabolic diseases: Secondary | ICD-10-CM

## 2020-02-18 DIAGNOSIS — Z808 Family history of malignant neoplasm of other organs or systems: Secondary | ICD-10-CM

## 2020-02-18 DIAGNOSIS — F319 Bipolar disorder, unspecified: Secondary | ICD-10-CM

## 2020-02-18 DIAGNOSIS — C541 Malignant neoplasm of endometrium: Secondary | ICD-10-CM

## 2020-02-18 DIAGNOSIS — I10 Essential (primary) hypertension: Secondary | ICD-10-CM

## 2020-02-18 DIAGNOSIS — Z79899 Other long term (current) drug therapy: Secondary | ICD-10-CM

## 2020-02-18 DIAGNOSIS — E785 Hyperlipidemia, unspecified: Secondary | ICD-10-CM

## 2020-02-18 DIAGNOSIS — Z8249 Family history of ischemic heart disease and other diseases of the circulatory system: Secondary | ICD-10-CM

## 2020-02-18 DIAGNOSIS — Z8049 Family history of malignant neoplasm of other genital organs: Secondary | ICD-10-CM

## 2020-02-18 DIAGNOSIS — Z7982 Long term (current) use of aspirin: Secondary | ICD-10-CM

## 2020-02-18 DIAGNOSIS — Z791 Long term (current) use of non-steroidal anti-inflammatories (NSAID): Secondary | ICD-10-CM

## 2020-02-18 DIAGNOSIS — K219 Gastro-esophageal reflux disease without esophagitis: Secondary | ICD-10-CM

## 2020-02-18 DIAGNOSIS — F419 Anxiety disorder, unspecified: Secondary | ICD-10-CM

## 2020-02-18 MED ORDER — SENNOSIDES-DOCUSATE SODIUM 8.6-50 MG PO TABS: 2.0000 | ORAL_TABLET | Freq: Every day | ORAL | 0 refills | Status: DC

## 2020-02-18 MED ORDER — TRAMADOL HCL 50 MG PO TABS: 50.0000 mg | ORAL_TABLET | Freq: Four times a day (QID) | ORAL | 0 refills | Status: DC | PRN

## 2020-02-18 MED ORDER — IBUPROFEN 800 MG PO TABS: 800.0000 mg | ORAL_TABLET | Freq: Three times a day (TID) | ORAL | 0 refills | Status: DC | PRN

## 2020-02-18 NOTE — Progress Notes (Signed)
Consult Note: Gyn-Onc  Consult was requested by Dr. Ilda Basset for the evaluation of Sherry [facility] 59 y.o. female  CC:  Chief Complaint  Patient presents with  . Malignant neoplasm of uterus, unspecified site [facility])    New Patient    Assessment/Plan:  Sherry Strong  is a 59 y.o.  year old with FIGO grade 2 endometrioid endometrial adenocarcinoma.   A detailed discussion was held with the patient and her family with regard to to her endometrial cancer diagnosis. We discussed the standard management options for uterine cancer which includes surgery followed possibly by adjuvant therapy depending on the results of surgery. The options for surgical management include a hysterectomy and removal of the tubes and ovaries possibly with removal of pelvic and para-aortic lymph nodes.If feasible, a minimally invasive approach including a robotic hysterectomy or laparoscopic hysterectomy have benefits including shorter hospital stay, recovery time and better wound healing than with open surgery. The patient has been counseled about these surgical options and the risks of surgery in general including infection, bleeding, damage to surrounding structures (including bowel, bladder, ureters, nerves or vessels), and the postoperative risks of PE/ DVT, and lymphedema. I extensively reviewed the additional risks of robotic hysterectomy including possible need for conversion to open laparotomy.  I discussed positioning during surgery of trendelenberg and risks of minor facial swelling and care we take in preoperative positioning.  After counseling and consideration of her options, she desires to proceed with robotic assisted total hysterectomy with bilateral sapingo-oophorectomy and SLN biopsy.   She will be seen by anesthesia for preoperative clearance and discussion of postoperative pain management.  She was given the opportunity to ask questions, which were answered to her satisfaction, and she is agreement with  the above mentioned plan of care.  HPI: Sherry Strong is a 59 year old P1 who was seen in consultation at the request of Dr Ilda Basset for evaluation of Grade 2 endometrioid adenocarcinoma of the endometrium.  The patient reported having symptoms of pink discharge in light vaginal bleeding that was postmenopausal in nature since December 2020.  She knew she had a primary care appointment scheduled with her primary care physician, Eldridge Abrahams, NP, in February 2021 and therefore waited until this visit to present with symptoms.  At that time she informed Sherry. Ronnald Ramp that she was having bleeding and a Pap smear was done.  That Pap smear was normal.  The bleeding symptoms persisted and therefore the patient called back to discuss this in April 2021 with Eldridge Abrahams who promptly referred her to an OB/GYN doctor, Dr. Ilda Basset.  Dr. Ilda Basset saw the patient on Feb 12, 2020 and after learning of symptoms of postmenopausal bleeding immediately performed an endometrial biopsy at that visit.  This revealed endometrioid carcinoma, approaching FIGO grade 2.  The patient reported having a past medical history significant for severe depression and anxiety, hiatal hernia, obesity with a BMI of 38kg/m2, hypertension, prediabetes, and hypercholesterolemia.  She has a reported history of postop nausea and vomiting.  Her surgical history is remarkable for a laparoscopic cholecystectomy and a cesarean section.  She also reported having a laparoscopy approximately 30 years ago for infertility at which time a very small area of endometriosis was identified but not operated on.  She subsequently conceived.  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  Her job involves entertainment, and taking patient to appointments.  It does not involve heavy lifting other than lifting the patient's wheelchair or  walker into the car.  Patient lives with her boyfriend who is independent in activities of daily living.  Family  history is remarkable for only her mother having a possible history of cervical dysplasia but no gynecologic, colon, or breast malignancies in the family.  Current Meds:  Outpatient Encounter Medications as of 02/18/2020  Medication Sig  . ALPRAZolam (XANAX) 1 MG tablet SMARTSIG:1 Tablet(s) By Mouth 1 to 3 Times Daily PRN  . dexlansoprazole (DEXILANT) 60 MG capsule Take 60 mg by mouth daily.  . fenofibrate (TRICOR) 145 MG tablet Take 145 mg by mouth daily.  . fenofibrate (TRICOR) 145 MG tablet TAKE 1 TABLET BY MOUTH EVERY DAY  . ketoconazole (NIZORAL) 2 % shampoo APPLY TO SCALP 3 TIMES A WEEK. LATHER WELL, LET SIT 3 5 MINUTES THEN RINSE  . lamoTRIgine (LAMICTAL) 150 MG tablet Take 1 tablet (150 mg total) by mouth daily.  Marland Kitchen lamoTRIgine (LAMICTAL) 200 MG tablet Take 200 mg by mouth daily.  Marland Kitchen LATUDA 60 MG TABS Take 1 tablet by mouth at bedtime.  . pantoprazole (PROTONIX) 40 MG tablet Take 40 mg by mouth daily.  . pravastatin (PRAVACHOL) 40 MG tablet Take 40 mg by mouth every evening.  . traZODone (DESYREL) 100 MG tablet TAKE 1 TABLET BY MOUTH 45 MINS BEFORE BEDTIME FOR SLEEP  . metoprolol succinate (TOPROL-XL) 25 MG 24 hr tablet Take 25 mg by mouth daily.   . [DISCONTINUED] ALPRAZolam (XANAX) 0.5 MG tablet Take 0.5 mg by mouth 2 (two) times daily as needed. For anxiety  . [DISCONTINUED] aspirin EC 81 MG tablet Take 81 mg by mouth.  . [DISCONTINUED] ibuprofen (ADVIL,MOTRIN) 800 MG tablet Take 800 mg by mouth every 8 (eight) hours as needed for pain.  . [DISCONTINUED] LATUDA 80 MG TABS tablet TAKE 1 TABLET BY MOUTH EVERY DAY WITH BREAKFAST (Patient not taking: Reported on 02/18/2020)  . [DISCONTINUED] metFORMIN (GLUCOPHAGE) 500 MG tablet TAKE 1 TABLET BY MOUTH EVERY DAY WITH BREAKFAST (Patient not taking: Reported on 02/12/2020)  . [DISCONTINUED] vitamin C (ASCORBIC ACID) 500 MG tablet Take 500 mg by mouth daily.  . [DISCONTINUED] Vitamin D, Ergocalciferol, (DRISDOL) 1.25 MG (50000 UT) CAPS capsule  Take 1 capsule (50,000 Units total) by mouth every 7 (seven) days. (Patient not taking: Reported on 02/18/2020)   No facility-administered encounter medications on file as of 02/18/2020.    Allergy:  Allergies  Allergen Reactions  . Vistaril  [Hydroxyzine Hcl] Swelling    Social Hx:   Social History   Socioeconomic History  . Marital status: Divorced    Spouse name: Not on file  . Number of children: Not on file  . Years of education: Not on file  . Highest education level: Not on file  Occupational History  . 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 Smoking status: Never Smoker  . Smokeless tobacco: Never Used  Substance and Sexual Activity  . Alcohol use: Yes    Alcohol/week: 5.0 - 6.0 standard drinks    Types: 5 - 6 Cans of beer per week    Comment: occas   . Drug use: No  . Sexual activity: Not Currently    Birth control/protection: None  Other Topics Concern  . Not on file  Social History Narrative  . Not on file   Social Determinants of Health   Financial Resource Strain:   . Difficulty of Paying Living Expenses:   Food Insecurity:   . Worried About Charity fundraiser in the Last Year:   .  Ran Out of Food in the Last Year:   Transportation Needs:   . Film/video editor (Medical):   Marland Kitchen Lack of Transportation (Non-Medical):   Physical Activity:   . Days of Exercise per Week:   . Minutes of Exercise per Session:   Stress:   . Feeling of Stress :   Social Connections:   . Frequency of Communication with Friends and Family:   . Frequency of Social Gatherings with Friends and Family:   . Attends Religious Services:   . Active Member of Clubs or Organizations:   . Attends Archivist Meetings:   Marland Kitchen Marital Status:   Intimate Partner Violence:   . Fear of Current or Ex-Partner:   . Emotionally Abused:   Marland Kitchen Physically Abused:   . Sexually Abused:     Past Surgical Hx:  Past Surgical History:  Procedure Laterality Date  . CESAREAN  SECTION    . CHOLECYSTECTOMY N/A 10/05/2017   Procedure: LAPAROSCOPIC CHOLECYSTECTOMY WITH INTRAOPERATIVE CHOLANGIOGRAM;  Surgeon: Alphonsa Overall, MD;  Location: [facility];  Service: General;  Laterality: N/A;  . ENDOMETRIAL BIOPSY  02/12/2020      . LAPAROSCOPY      Past Medical Hx:  Past Medical History:  Diagnosis Date  . Anxiety   . Bipolar disorder ([place])   . Complication of anesthesia   . Depression   . GERD (gastroesophageal reflux disease)   . Heart murmur    echo- 08/30/15-epic   . History of hiatal hernia   . HLD (hyperlipidemia)   . HTN (hypertension)   . Infertility, female   . Menopause   . PONV (postoperative nausea and vomiting)     Past Gynecological History:  See HPI Patient's last menstrual period was 01/25/2011.  Family Hx:  Family History  Problem Relation Age of Onset  . Heart disease Mother   . Depression Mother   . Cancer Mother        precancerous cells on cervix  . Hyperlipidemia Father   . Hypertension Father   . Heart disease Father   . Cancer Father   . Depression Father   . Anxiety disorder Father   . Sleep apnea Father     Review of Systems:  Constitutional  Feels well,    ENT Normal appearing ears and nares bilaterally Skin/Breast  No rash, sores, jaundice, itching, dryness Cardiovascular  No chest pain, shortness of breath, or edema  Pulmonary  No cough or wheeze.  Gastro Intestinal  No nausea, vomitting, or diarrhoea. No bright red blood per rectum, no abdominal pain, change in bowel movement, or constipation.  Genito Urinary  No frequency, urgency, dysuria, + postmenopausal bleeding Musculo Skeletal  No myalgia, arthralgia, joint swelling or pain  Neurologic  No weakness, numbness, change in gait,  Psychology  No depression, anxiety, insomnia.   Vitals:  Blood pressure 125/72, pulse 73, temperature 98.5 F (36.9 C), temperature source Oral, resp. rate 17, height 5\' 3"  (1.6 m), weight 215 lb (97.5 kg), last menstrual period  01/25/2011, SpO2 99 %.  Physical Exam: WD in NAD Neck  Supple NROM, without any enlargements.  Lymph Node Survey No cervical supraclavicular or inguinal adenopathy Cardiovascular  Pulse normal rate, regularity and rhythm. S1 and S2 normal.  Lungs  Clear to auscultation bilateraly, without wheezes/crackles/rhonchi. Good air movement.  Skin  No rash/lesions/breakdown  Psychiatry  Alert and oriented to person, place, and time  Abdomen  Normoactive bowel sounds, abdomen soft, non-tender and obese without evidence of hernia.  Back No CVA tenderness Genito Urinary  Vulva/vagina: Normal external female genitalia.  No lesions. No discharge or bleeding.  Bladder/urethra:  No lesions or masses, well supported bladder  Vagina: smooth, no lesions.  Cervix: Normal appearing, no lesions.  Uterus: Small, mobile, no parametrial involvement or nodularity.  Adnexa: no palpable masses. Rectal  deferred Extremities  No bilateral cyanosis, clubbing or edema.   Thereasa Solo, MD  02/18/2020, 9:38 AM

## 2020-02-18 NOTE — H&P (View-Only) (Signed)
Consult Note: Gyn-Onc  Consult was requested by Dr. Ilda Basset for the evaluation of Sherry Strong [facility] 59 y.o. female  CC:  Chief Complaint  Patient presents with  . Malignant neoplasm of uterus, unspecified site [facility])    New Patient    Assessment/Plan:  Sherry Strong  is a 59 y.o.  year old with FIGO grade 2 endometrioid endometrial adenocarcinoma.   A detailed discussion was held with the patient and her family with regard to to her endometrial cancer diagnosis. We discussed the standard management options for uterine cancer which includes surgery followed possibly by adjuvant therapy depending on the results of surgery. The options for surgical management include a hysterectomy and removal of the tubes and ovaries possibly with removal of pelvic and para-aortic lymph nodes.If feasible, a minimally invasive approach including a robotic hysterectomy or laparoscopic hysterectomy have benefits including shorter hospital stay, recovery time and better wound healing than with open surgery. The patient has been counseled about these surgical options and the risks of surgery in general including infection, bleeding, damage to surrounding structures (including bowel, bladder, ureters, nerves or vessels), and the postoperative risks of PE/ DVT, and lymphedema. I extensively reviewed the additional risks of robotic hysterectomy including possible need for conversion to open laparotomy.  I discussed positioning during surgery of trendelenberg and risks of minor facial swelling and care we take in preoperative positioning.  After counseling and consideration of her options, she desires to proceed with robotic assisted total hysterectomy with bilateral sapingo-oophorectomy and SLN biopsy.   She will be seen by anesthesia for preoperative clearance and discussion of postoperative pain management.  She was given the opportunity to ask questions, which were answered to her satisfaction, and she is agreement with  the above mentioned plan of care.  HPI: [name] is a 59 year old P1 who was seen in consultation at the request of Dr Ilda Basset for evaluation of Grade 2 endometrioid adenocarcinoma of the endometrium.  The patient reported having symptoms of pink discharge in light vaginal bleeding that was postmenopausal in nature since December 2020.  She knew she had a primary care appointment scheduled with her primary care physician, Eldridge Abrahams, NP, in February 2021 and therefore waited until this visit to present with symptoms.  At that time she informed Ms. Ronnald Ramp that she was having bleeding and a Pap smear was done.  That Pap smear was normal.  The bleeding symptoms persisted and therefore the patient called back to discuss this in April 2021 with Eldridge Abrahams who promptly referred her to an OB/GYN doctor, Dr. Ilda Basset.  Dr. Ilda Basset saw the patient on Feb 12, 2020 and after learning of symptoms of postmenopausal bleeding immediately performed an endometrial biopsy at that visit.  This revealed endometrioid carcinoma, approaching FIGO grade 2.  The patient reported having a past medical history significant for severe depression and anxiety, hiatal hernia, obesity with a BMI of 38kg/m2, hypertension, prediabetes, and hypercholesterolemia.  She has a reported history of postop nausea and vomiting.  Her surgical history is remarkable for a laparoscopic cholecystectomy and a cesarean section.  She also reported having a laparoscopy approximately 30 years ago for infertility at which time a very small area of endometriosis was identified but not operated on.  She subsequently conceived.  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  Her job involves entertainment, and taking patient to appointments.  It does not involve heavy lifting other than lifting the patient's wheelchair or  walker into the car.  Patient lives with her boyfriend who is independent in activities of daily living.  Family  history is remarkable for only her mother having a possible history of cervical dysplasia but no gynecologic, colon, or breast malignancies in the family.  Current Meds:  Outpatient Encounter Medications as of 02/18/2020  Medication Sig  . ALPRAZolam (XANAX) 1 MG tablet SMARTSIG:1 Tablet(s) By Mouth 1 to 3 Times Daily PRN  . dexlansoprazole (DEXILANT) 60 MG capsule Take 60 mg by mouth daily.  . fenofibrate (TRICOR) 145 MG tablet Take 145 mg by mouth daily.  . fenofibrate (TRICOR) 145 MG tablet TAKE 1 TABLET BY MOUTH EVERY DAY  . ketoconazole (NIZORAL) 2 % shampoo APPLY TO SCALP 3 TIMES A WEEK. LATHER WELL, LET SIT 3 5 MINUTES THEN RINSE  . lamoTRIgine (LAMICTAL) 150 MG tablet Take 1 tablet (150 mg total) by mouth daily.  Marland Kitchen lamoTRIgine (LAMICTAL) 200 MG tablet Take 200 mg by mouth daily.  Marland Kitchen LATUDA 60 MG TABS Take 1 tablet by mouth at bedtime.  . pantoprazole (PROTONIX) 40 MG tablet Take 40 mg by mouth daily.  . pravastatin (PRAVACHOL) 40 MG tablet Take 40 mg by mouth every evening.  . traZODone (DESYREL) 100 MG tablet TAKE 1 TABLET BY MOUTH 45 MINS BEFORE BEDTIME FOR SLEEP  . metoprolol succinate (TOPROL-XL) 25 MG 24 hr tablet Take 25 mg by mouth daily.   . [DISCONTINUED] ALPRAZolam (XANAX) 0.5 MG tablet Take 0.5 mg by mouth 2 (two) times daily as needed. For anxiety  . [DISCONTINUED] aspirin EC 81 MG tablet Take 81 mg by mouth.  . [DISCONTINUED] ibuprofen (ADVIL,MOTRIN) 800 MG tablet Take 800 mg by mouth every 8 (eight) hours as needed for pain.  . [DISCONTINUED] LATUDA 80 MG TABS tablet TAKE 1 TABLET BY MOUTH EVERY DAY WITH BREAKFAST (Patient not taking: Reported on 02/18/2020)  . [DISCONTINUED] metFORMIN (GLUCOPHAGE) 500 MG tablet TAKE 1 TABLET BY MOUTH EVERY DAY WITH BREAKFAST (Patient not taking: Reported on 02/12/2020)  . [DISCONTINUED] vitamin C (ASCORBIC ACID) 500 MG tablet Take 500 mg by mouth daily.  . [DISCONTINUED] Vitamin D, Ergocalciferol, (DRISDOL) 1.25 MG (50000 UT) CAPS capsule  Take 1 capsule (50,000 Units total) by mouth every 7 (seven) days. (Patient not taking: Reported on 02/18/2020)   No facility-administered encounter medications on file as of 02/18/2020.    Allergy:  Allergies  Allergen Reactions  . Vistaril  [Hydroxyzine Hcl] Swelling    Social Hx:   Social History   Socioeconomic History  . Marital status: Divorced    Spouse name: Not on file  . Number of children: Not on file  . Years of education: Not on file  . Highest education level: Not on file  Occupational History  . 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 Smoking status: Never Smoker  . Smokeless tobacco: Never Used  Substance and Sexual Activity  . Alcohol use: Yes    Alcohol/week: 5.0 - 6.0 standard drinks    Types: 5 - 6 Cans of beer per week    Comment: occas   . Drug use: No  . Sexual activity: Not Currently    Birth control/protection: None  Other Topics Concern  . Not on file  Social History Narrative  . Not on file   Social Determinants of Health   Financial Resource Strain:   . Difficulty of Paying Living Expenses:   Food Insecurity:   . Worried About Charity fundraiser in the Last Year:   .  Ran Out of Food in the Last Year:   Transportation Needs:   . Film/video editor (Medical):   Marland Kitchen Lack of Transportation (Non-Medical):   Physical Activity:   . Days of Exercise per Week:   . Minutes of Exercise per Session:   Stress:   . Feeling of Stress :   Social Connections:   . Frequency of Communication with Friends and Family:   . Frequency of Social Gatherings with Friends and Family:   . Attends Religious Services:   . Active Member of Clubs or Organizations:   . Attends Archivist Meetings:   Marland Kitchen Marital Status:   Intimate Partner Violence:   . Fear of Current or Ex-Partner:   . Emotionally Abused:   Marland Kitchen Physically Abused:   . Sexually Abused:     Past Surgical Hx:  Past Surgical History:  Procedure Laterality Date  . CESAREAN  SECTION    . CHOLECYSTECTOMY N/A 10/05/2017   Procedure: LAPAROSCOPIC CHOLECYSTECTOMY WITH INTRAOPERATIVE CHOLANGIOGRAM;  Surgeon: Alphonsa Overall, MD;  Location: [facility];  Service: General;  Laterality: N/A;  . ENDOMETRIAL BIOPSY  02/12/2020      . LAPAROSCOPY      Past Medical Hx:  Past Medical History:  Diagnosis Date  . Anxiety   . Bipolar disorder ([place])   . Complication of anesthesia   . Depression   . GERD (gastroesophageal reflux disease)   . Heart murmur    echo- 08/30/15-epic   . History of hiatal hernia   . HLD (hyperlipidemia)   . HTN (hypertension)   . Infertility, female   . Menopause   . PONV (postoperative nausea and vomiting)     Past Gynecological History:  See HPI Patient's last menstrual period was 01/25/2011.  Family Hx:  Family History  Problem Relation Age of Onset  . Heart disease Mother   . Depression Mother   . Cancer Mother        precancerous cells on cervix  . Hyperlipidemia Father   . Hypertension Father   . Heart disease Father   . Cancer Father   . Depression Father   . Anxiety disorder Father   . Sleep apnea Father     Review of Systems:  Constitutional  Feels well,    ENT Normal appearing ears and nares bilaterally Skin/Breast  No rash, sores, jaundice, itching, dryness Cardiovascular  No chest pain, shortness of breath, or edema  Pulmonary  No cough or wheeze.  Gastro Intestinal  No nausea, vomitting, or diarrhoea. No bright red blood per rectum, no abdominal pain, change in bowel movement, or constipation.  Genito Urinary  No frequency, urgency, dysuria, + postmenopausal bleeding Musculo Skeletal  No myalgia, arthralgia, joint swelling or pain  Neurologic  No weakness, numbness, change in gait,  Psychology  No depression, anxiety, insomnia.   Vitals:  Blood pressure 125/72, pulse 73, temperature 98.5 F (36.9 C), temperature source Oral, resp. rate 17, height 5\' 3"  (1.6 m), weight 215 lb (97.5 kg), last menstrual period  01/25/2011, SpO2 99 %.  Physical Exam: WD in NAD Neck  Supple NROM, without any enlargements.  Lymph Node Survey No cervical supraclavicular or inguinal adenopathy Cardiovascular  Pulse normal rate, regularity and rhythm. S1 and S2 normal.  Lungs  Clear to auscultation bilateraly, without wheezes/crackles/rhonchi. Good air movement.  Skin  No rash/lesions/breakdown  Psychiatry  Alert and oriented to person, place, and time  Abdomen  Normoactive bowel sounds, abdomen soft, non-tender and obese without evidence of hernia.  Back No CVA tenderness Genito Urinary  Vulva/vagina: Normal external female genitalia.  No lesions. No discharge or bleeding.  Bladder/urethra:  No lesions or masses, well supported bladder  Vagina: smooth, no lesions.  Cervix: Normal appearing, no lesions.  Uterus: Small, mobile, no parametrial involvement or nodularity.  Adnexa: no palpable masses. Rectal  deferred Extremities  No bilateral cyanosis, clubbing or edema.   Thereasa Solo, MD  02/18/2020, 9:38 AM

## 2020-02-20 ENCOUNTER — Other Ambulatory Visit: Payer: Medicare Other

## 2020-02-20 ENCOUNTER — Telehealth: Payer: Self-pay | Admitting: *Deleted

## 2020-02-20 NOTE — Telephone Encounter (Signed)
Fax office note and pathology to [facility] for surgery approval

## 2020-02-26 ENCOUNTER — Ambulatory Visit: Payer: Medicare Other | Admitting: Obstetrics and Gynecology

## 2020-02-26 NOTE — Patient Instructions (Addendum)
Get your Covid test at [place], [place] on 03/04/20 at 2:55 PM. It is a drive thru.   Sherry Strong [id number]       Your procedure is scheduled on 03/08/20   Report to [facility]  At 7:00  A.M.   Call this number if you have problems the morning of surgery:[phone]   OUR ADDRESS IS [place], WE ARE LOCATED IN THE [facility]ICAL PLA[facility] WITH [facility].   Remember:  Do not eat food or drink liquids after midnight.    Take these medicines the morning of surgery with A SIP OF WATER: none   Do not wear jewelry, make-up or nail polish.  Do not wear lotions, powders, or perfumes, or deoderant.  Do not shave 48 hours prior to surgery.    Do not bring valuables to the hospital.  [facility] is not responsible for any belongings or valuables.  Contacts, dentures or bridgework may not be worn into surgery.   For patients admitted to the hospital, discharge time will be determined by your treatment team.  Patients discharged the day of surgery will not be allowed to drive home.   Special instructions:  Bring your prescription meds in their original bottles  Please read over the following fact sheets that you were given:       [facility] - Preparing for Surgery Before surgery, you can play an important role.   Because skin is not sterile, your skin needs to be as free of germs as possible.   You can reduce the number of germs on your skin by washing with CHG (chlorahexidine gluconate) soap before surgery.   CHG is an antiseptic cleaner which kills germs and bonds with the skin to continue killing germs even after washing. Please DO NOT use if you have an allergy to CHG or antibacterial soaps.   If your skin becomes reddened/irritated stop using the CHG and inform your nurse when you arrive at Short Stay. Do not shave (including legs and underarms) for at least 48 hours prior to the first CHG shower.    Please follow these instructions  carefully:   1.  Shower with CHG Soap the night before surgery and the  morning of Surgery.  2.  If you choose to wash your hair, wash your hair first as usual with your  normal  shampoo.  3.  After you shampoo, rinse your hair and body thoroughly to remove the  shampoo.                                        4.  Use CHG as you would any other liquid soap.  You can apply chg directly  to the skin and wash                       Gently with a scrungie or clean washcloth.  5.  Apply the CHG Soap to your body ONLY FROM THE NECK DOWN.   Do not use on face/ open                           Wound or open sores. Avoid contact with eyes, ears mouth and genitals (private parts).  Wash face,  Genitals (private parts) with your normal soap.             6.  Wash thoroughly, paying special attention to the area where your surgery  will be performed.  7.  Thoroughly rinse your body with warm water from the neck down.  8.  DO NOT shower/wash with your normal soap after using and rinsing off  the CHG Soap.             9.  Pat yourself dry with a clean towel.            10.  Wear clean pajamas.            11.  Place clean sheets on your bed the night of your first shower and do not  sleep with pets. Day of Surgery : Do not apply any lotions/deodorants the morning of surgery.  Please wear clean clothes to the hospital/surgery center.  FAILURE TO FOLLOW THESE INSTRUCTIONS MAY RESULT IN THE CANCELLATION OF YOUR SURGERY PATIENT SIGNATURE_________________________________  NURSE SIGNATURE__________________________________  ________________________________________________________________________   Sherry Strong  An incentive spirometer is a tool that can help keep your lungs clear and active. This tool measures how well you are filling your lungs with each breath. Taking long deep breaths may help reverse or decrease the chance of developing breathing (pulmonary) problems (especially  infection) following:  A long period of time when you are unable to move or be active. BEFORE THE PROCEDURE   If the spirometer includes an indicator to show your best effort, your nurse or respiratory therapist will set it to a desired goal.  If possible, sit up straight or lean slightly forward. Try not to slouch.  Hold the incentive spirometer in an upright position. INSTRUCTIONS FOR USE  1. Sit on the edge of your bed if possible, or sit up as far as you can in bed or on a chair. 2. Hold the incentive spirometer in an upright position. 3. Breathe out normally. 4. Place the mouthpiece in your mouth and seal your lips tightly around it. 5. Breathe in slowly and as deeply as possible, raising the piston or the ball toward the top of the column. 6. Hold your breath for 3-5 seconds or for as long as possible. Allow the piston or ball to fall to the bottom of the column. 7. Remove the mouthpiece from your mouth and breathe out normally. 8. Rest for a few seconds and repeat Steps 1 through 7 at least 10 times every 1-2 hours when you are awake. Take your time and take a few normal breaths between deep breaths. 9. The spirometer may include an indicator to show your best effort. Use the indicator as a goal to work toward during each repetition. 10. After each set of 10 deep breaths, practice coughing to be sure your lungs are clear. If you have an incision (the cut made at the time of surgery), support your incision when coughing by placing a pillow or rolled up towels firmly against it. Once you are able to get out of bed, walk around indoors and cough well. You may stop using the incentive spirometer when instructed by your caregiver.  RISKS AND COMPLICATIONS  Take your time so you do not get dizzy or light-headed.  If you are in pain, you may need to take or ask for pain medication before doing incentive spirometry. It is harder to take a deep breath if you are having pain. AFTER  USE  Rest and breathe slowly and easily.  It can be helpful to keep track of a log of your progress. Your caregiver can provide you with a simple table to help with this. If you are using the spirometer at home, follow these instructions: [name] IF:   You are having difficultly using the spirometer.  You have trouble using the spirometer as often as instructed.  Your pain medication is not giving enough relief while using the spirometer.  You develop fever of 100.5 F (38.1 C) or higher. SEEK IMMEDIATE MEDICAL CARE IF:   You cough up bloody sputum that had not been present before.  You develop fever of 102 F (38.9 C) or greater.  You develop worsening pain at or near the incision site. MAKE SURE YOU:   Understand these instructions.  Will watch your condition.  Will get help right away if you are not doing well or get worse. Document Released: 01/22/2007 Document Revised: 12/04/2011 Document Reviewed: 03/25/2007 ExitCare Patient Information 2014 Exit[employer], [place].   ________________________________________________________________________  WHAT IS A BLOOD TRANSFUSION? Blood Transfusion Information  A transfusion is the replacement of blood or some of its parts. Blood is made up of multiple cells which provide different functions.  Red blood cells carry oxygen and are used for blood loss replacement.  White blood cells fight against infection.  Platelets control bleeding.  Plasma helps clot blood.  Other blood products are available for specialized needs, such as hemophilia or other clotting disorders. BEFORE THE TRANSFUSION  Who gives blood for transfusions?   Healthy volunteers who are fully evaluated to make sure their blood is safe. This is blood bank blood. Transfusion therapy is the safest it has ever been in the practice of medicine. Before blood is taken from a donor, a complete history is taken to make sure that person has no history of diseases  nor engages in risky social behavior (examples are intravenous drug use or sexual activity with multiple partners). The donor's travel history is screened to minimize risk of transmitting infections, such as malaria. The donated blood is tested for signs of infectious diseases, such as HIV and hepatitis. The blood is then tested to be sure it is compatible with you in order to minimize the chance of a transfusion reaction. If you or a relative donates blood, this is often done in anticipation of surgery and is not appropriate for emergency situations. It takes many days to process the donated blood. RISKS AND COMPLICATIONS Although transfusion therapy is very safe and saves many lives, the main dangers of transfusion include:   Getting an infectious disease.  Developing a transfusion reaction. This is an allergic reaction to something in the blood you were given. Every precaution is taken to prevent this. The decision to have a blood transfusion has been considered carefully by your caregiver before blood is given. Blood is not given unless the benefits outweigh the risks. AFTER THE TRANSFUSION  Right after receiving a blood transfusion, you will usually feel much better and more energetic. This is especially true if your red blood cells have gotten low (anemic). The transfusion raises the level of the red blood cells which carry oxygen, and this usually causes an energy increase.  The nurse administering the transfusion will monitor you carefully for complications. HOME CARE INSTRUCTIONS  No special instructions are needed after a transfusion. You may find your energy is better. Speak with your caregiver about any limitations on activity for underlying diseases you may have. SEEK  MEDICAL CARE IF:   Your condition is not improving after your transfusion.  You develop redness or irritation at the intravenous (IV) site. SEEK IMMEDIATE MEDICAL CARE IF:  Any of the following symptoms occur over the  next 12 hours:  Shaking chills.  You have a temperature by mouth above 102 F (38.9 C), not controlled by medicine.  Chest, back, or muscle pain.  People around you feel you are not acting correctly or are confused.  Shortness of breath or difficulty breathing.  Dizziness and fainting.  You get a rash or develop hives.  You have a decrease in urine output.  Your urine turns a dark color or changes to pink, red, or brown. Any of the following symptoms occur over the next 10 days:  You have a temperature by mouth above 102 F (38.9 C), not controlled by medicine.  Shortness of breath.  Weakness after normal activity.  The white part of the eye turns yellow (jaundice).  You have a decrease in the amount of urine or are urinating less often.  Your urine turns a dark color or changes to pink, red, or brown. Document Released: 09/08/2000 Document Revised: 12/04/2011 Document Reviewed: 04/27/2008 [facility] Patient Information 2014 [place], [place].  _______________________________________________________________________

## 2020-02-27 ENCOUNTER — Encounter (HOSPITAL_COMMUNITY): Payer: Self-pay

## 2020-02-27 ENCOUNTER — Other Ambulatory Visit: Payer: Self-pay

## 2020-02-27 ENCOUNTER — Encounter (HOSPITAL_COMMUNITY)
Admission: RE | Admit: 2020-02-27 | Discharge: 2020-02-27 | Disposition: A | Discharge status: Home / Self Care | Payer: Medicare Other | Source: Ambulatory Visit | Attending: Gynecologic Oncology | Admitting: Gynecologic Oncology

## 2020-02-27 DIAGNOSIS — Z01812 Encounter for preprocedural laboratory examination: Secondary | ICD-10-CM | POA: Insufficient documentation

## 2020-02-27 DIAGNOSIS — Z01818 Encounter for other preprocedural examination: Secondary | ICD-10-CM | POA: Diagnosis present

## 2020-02-27 DIAGNOSIS — I10 Essential (primary) hypertension: Secondary | ICD-10-CM | POA: Diagnosis not present

## 2020-02-27 LAB — URINALYSIS, ROUTINE W REFLEX MICROSCOPIC
Bilirubin Urine: NEGATIVE
Glucose, UA: NEGATIVE mg/dL
Ketones, ur: NEGATIVE mg/dL
Nitrite: NEGATIVE
Protein, ur: NEGATIVE mg/dL
Specific Gravity, Urine: 1.004 — ABNORMAL LOW (ref 1.005–1.030)
pH: 7 (ref 5.0–8.0)

## 2020-02-27 LAB — COMPREHENSIVE METABOLIC PANEL
ALT: 63 U/L — ABNORMAL HIGH (ref 0–44)
AST: 44 U/L — ABNORMAL HIGH (ref 15–41)
Albumin: 4.4 g/dL (ref 3.5–5.0)
Alkaline Phosphatase: 63 U/L (ref 38–126)
Anion gap: 9 (ref 5–15)
BUN: 10 mg/dL (ref 6–20)
CO2: 26 mmol/L (ref 22–32)
Calcium: 8.9 mg/dL (ref 8.9–10.3)
Chloride: 107 mmol/L (ref 98–111)
Creatinine, Ser: 0.7 mg/dL (ref 0.44–1.00)
GFR calc Af Amer: >60 mL/min (ref >60)
GFR calc non Af Amer: >60 mL/min (ref >60)
Glucose, Bld: 104 mg/dL — ABNORMAL HIGH (ref 70–99)
Potassium: 4.3 mmol/L (ref 3.5–5.1)
Sodium: 142 mmol/L (ref 135–145)
Total Bilirubin: 0.5 mg/dL (ref 0.3–1.2)
Total Protein: 7.2 g/dL (ref 6.5–8.1)

## 2020-02-27 LAB — ABO/RH: ABO/RH(D): O POS

## 2020-02-27 LAB — CBC
HCT: 40.8 % (ref 36.0–46.0)
Hemoglobin: 13.2 g/dL (ref 12.0–15.0)
MCH: 32.9 pg (ref 26.0–34.0)
MCHC: 32.4 g/dL (ref 30.0–36.0)
MCV: 101.7 fL — ABNORMAL HIGH (ref 80.0–100.0)
Platelets: 216 10*3/uL (ref 150–400)
RBC: 4.01 MIL/uL (ref 3.87–5.11)
RDW: 13 % (ref 11.5–15.5)
WBC: 5.5 10*3/uL (ref 4.0–10.5)
nRBC: 0 % (ref 0.0–0.2)

## 2020-02-27 NOTE — Progress Notes (Signed)
COVID Vaccine Completed:No Date COVID Vaccine completed: COVID vaccine manufacturer: [place]   PCP - Eldridge Abrahams Cardiologist - no  Chest x-ray - no EKG - 02/27/20 Stress Test - no ECHO - 2018 Cardiac Cath - no  Sleep Study - yes negative findings CPAP -   Fasting Blood Sugar - NA Checks Blood Sugar _____ times a day  Blood Thinner Instructions:NA Aspirin Instructions: Last Dose:  Anesthesia review:   Patient denies shortness of breath, fever, cough and chest pain at PAT appointment yes  Patient verbalized understanding of instructions that were given to them at the PAT appointment. Patient was also instructed that they will need to review over the PAT instructions again at home before surgery. Yes

## 2020-02-28 LAB — URINE CULTURE

## 2020-03-01 ENCOUNTER — Telehealth: Payer: Self-pay | Admitting: *Deleted

## 2020-03-01 NOTE — Telephone Encounter (Signed)
Sherry Strong called from pre-surgery regarding the patient's urine culture results. Results read "multiple species present suggest recollection" Per Melissa APP recollect urine morning of surgery

## 2020-03-04 ENCOUNTER — Other Ambulatory Visit (HOSPITAL_COMMUNITY)
Admission: RE | Admit: 2020-03-04 | Discharge: 2020-03-04 | Disposition: A | Discharge status: Home / Self Care | Payer: Medicare Other | Source: Ambulatory Visit | Attending: Gynecologic Oncology | Admitting: Gynecologic Oncology

## 2020-03-04 DIAGNOSIS — Z01812 Encounter for preprocedural laboratory examination: Secondary | ICD-10-CM | POA: Insufficient documentation

## 2020-03-04 DIAGNOSIS — Z20822 Contact with and (suspected) exposure to covid-19: Secondary | ICD-10-CM | POA: Diagnosis not present

## 2020-03-04 LAB — SARS CORONAVIRUS 2 (TAT 6-24 HRS): SARS Coronavirus 2: NEGATIVE

## 2020-03-05 ENCOUNTER — Telehealth: Payer: Self-pay

## 2020-03-05 NOTE — Telephone Encounter (Signed)
Told Sherry Strong that her surgery on Monday 03-08-20 is at 0800 at the Surgery center not 0900.   She needs to arrive at the surgery center at 0600 on Monday 03-08-20.  Sherry Strong verbalized understanding.

## 2020-03-05 NOTE — Progress Notes (Signed)
Spoke with patient son and made aware surgery time changed arrive 600 am 03-08-2020

## 2020-03-05 NOTE — Telephone Encounter (Signed)
Reviewed written pre-op instructions with Ms [phone]. She understands instructions. She and her son Gwyndolyn Saxon concerned about her advance directives being able to be seen by Dr. Denman George in an untoward event peri operative. Pt had Advance directives made and notarized this week. Told her to give them to the nurse at the surgery center and have them on the front of the patient's chart if the document cannot immediately uploaded so Dr. Denman George can see them prior to surgery. Made Dr. Denman George aware that the Advance directive was new and coming with patient for surgery 03-08-20. Her son Altie Savard is her Press photographer. Told Ms Konkel that her son Gwyndolyn Saxon is the first contact person in her chart.   Dr. Denman George notified of the above information.

## 2020-03-07 ENCOUNTER — Encounter (HOSPITAL_BASED_OUTPATIENT_CLINIC_OR_DEPARTMENT_OTHER): Payer: Self-pay | Admitting: Gynecologic Oncology

## 2020-03-07 NOTE — Anesthesia Preprocedure Evaluation (Addendum)
Anesthesia Evaluation  Patient identified by MRN, date of birth, ID band Patient awake    Reviewed: Allergy & Precautions, NPO status , Patient's Chart, lab work & pertinent test results  History of Anesthesia Complications (+) PONV and history of anesthetic complications  Airway Mallampati: II  TM Distance: >3 FB Neck ROM: Full    Dental  (+) Chipped,    Pulmonary neg pulmonary ROS,    Pulmonary exam normal        Cardiovascular hypertension, Normal cardiovascular exam  TTE 2018: EF 60-65%, grade 1 DD   Neuro/Psych Anxiety Depression Bipolar Disorder negative neurological ROS     GI/Hepatic Neg liver ROS, hiatal hernia, GERD  Medicated and Controlled,  Endo/Other  negative endocrine ROS  Renal/GU negative Renal ROS  negative genitourinary   Musculoskeletal negative musculoskeletal ROS (+)   Abdominal   Peds  Hematology negative hematology ROS (+)   Anesthesia Other Findings Day of surgery medications reviewed with patient.  Reproductive/Obstetrics Endometrial ca                            Anesthesia Physical Anesthesia Plan  ASA: III  Anesthesia Plan: General   Post-op Pain Management:    Induction: Intravenous  PONV Risk Score and Plan: 4 or greater and Scopolamine patch - Pre-op, Ondansetron, Dexamethasone, Treatment may vary due to age or medical condition, Midazolam and Propofol infusion  Airway Management Planned: Oral ETT  Additional Equipment: None  Intra-op Plan:   Post-operative Plan: Extubation in OR  Informed Consent: I have reviewed the patients History and Physical, chart, labs and discussed the procedure including the risks, benefits and alternatives for the proposed anesthesia with the patient or authorized representative who has indicated his/her understanding and acceptance.     Dental advisory given  Plan Discussed with: CRNA  Anesthesia Plan  Comments:       Anesthesia Quick Evaluation

## 2020-03-08 ENCOUNTER — Other Ambulatory Visit: Payer: Self-pay

## 2020-03-08 ENCOUNTER — Ambulatory Visit (HOSPITAL_BASED_OUTPATIENT_CLINIC_OR_DEPARTMENT_OTHER): Payer: Medicare Other | Admitting: Anesthesiology

## 2020-03-08 ENCOUNTER — Encounter (HOSPITAL_BASED_OUTPATIENT_CLINIC_OR_DEPARTMENT_OTHER)
Admission: RE | Disposition: A | Discharge status: Home / Self Care | Payer: Self-pay | Source: Home / Self Care | Attending: Gynecologic Oncology

## 2020-03-08 ENCOUNTER — Encounter (HOSPITAL_BASED_OUTPATIENT_CLINIC_OR_DEPARTMENT_OTHER): Payer: Self-pay | Admitting: Gynecologic Oncology

## 2020-03-08 ENCOUNTER — Ambulatory Visit (HOSPITAL_BASED_OUTPATIENT_CLINIC_OR_DEPARTMENT_OTHER)
Admission: RE | Admit: 2020-03-08 | Discharge: 2020-03-08 | Disposition: A | Discharge status: Home / Self Care | Payer: Medicare Other | Attending: Gynecologic Oncology | Admitting: Gynecologic Oncology

## 2020-03-08 ENCOUNTER — Encounter: Payer: Self-pay | Admitting: Gynecologic Oncology

## 2020-03-08 DIAGNOSIS — Z8349 Family history of other endocrine, nutritional and metabolic diseases: Secondary | ICD-10-CM | POA: Diagnosis not present

## 2020-03-08 DIAGNOSIS — I10 Essential (primary) hypertension: Secondary | ICD-10-CM | POA: Diagnosis not present

## 2020-03-08 DIAGNOSIS — R7303 Prediabetes: Secondary | ICD-10-CM | POA: Insufficient documentation

## 2020-03-08 DIAGNOSIS — Z888 Allergy status to other drugs, medicaments and biological substances status: Secondary | ICD-10-CM | POA: Diagnosis not present

## 2020-03-08 DIAGNOSIS — C55 Malignant neoplasm of uterus, part unspecified: Secondary | ICD-10-CM

## 2020-03-08 DIAGNOSIS — Z6837 Body mass index (BMI) 37.0-37.9, adult: Secondary | ICD-10-CM | POA: Insufficient documentation

## 2020-03-08 DIAGNOSIS — E785 Hyperlipidemia, unspecified: Secondary | ICD-10-CM | POA: Insufficient documentation

## 2020-03-08 DIAGNOSIS — R011 Cardiac murmur, unspecified: Secondary | ICD-10-CM | POA: Diagnosis not present

## 2020-03-08 DIAGNOSIS — K449 Diaphragmatic hernia without obstruction or gangrene: Secondary | ICD-10-CM | POA: Diagnosis not present

## 2020-03-08 DIAGNOSIS — Z809 Family history of malignant neoplasm, unspecified: Secondary | ICD-10-CM | POA: Insufficient documentation

## 2020-03-08 DIAGNOSIS — Z9049 Acquired absence of other specified parts of digestive tract: Secondary | ICD-10-CM | POA: Diagnosis not present

## 2020-03-08 DIAGNOSIS — C541 Malignant neoplasm of endometrium: Secondary | ICD-10-CM | POA: Insufficient documentation

## 2020-03-08 DIAGNOSIS — E669 Obesity, unspecified: Secondary | ICD-10-CM

## 2020-03-08 DIAGNOSIS — C801 Malignant (primary) neoplasm, unspecified: Secondary | ICD-10-CM

## 2020-03-08 DIAGNOSIS — Z8249 Family history of ischemic heart disease and other diseases of the circulatory system: Secondary | ICD-10-CM | POA: Insufficient documentation

## 2020-03-08 DIAGNOSIS — Z791 Long term (current) use of non-steroidal anti-inflammatories (NSAID): Secondary | ICD-10-CM | POA: Diagnosis not present

## 2020-03-08 DIAGNOSIS — Z7984 Long term (current) use of oral hypoglycemic drugs: Secondary | ICD-10-CM | POA: Diagnosis not present

## 2020-03-08 DIAGNOSIS — F319 Bipolar disorder, unspecified: Secondary | ICD-10-CM | POA: Insufficient documentation

## 2020-03-08 DIAGNOSIS — E7800 Pure hypercholesterolemia, unspecified: Secondary | ICD-10-CM | POA: Insufficient documentation

## 2020-03-08 DIAGNOSIS — F419 Anxiety disorder, unspecified: Secondary | ICD-10-CM | POA: Insufficient documentation

## 2020-03-08 DIAGNOSIS — Z8049 Family history of malignant neoplasm of other genital organs: Secondary | ICD-10-CM | POA: Insufficient documentation

## 2020-03-08 DIAGNOSIS — K219 Gastro-esophageal reflux disease without esophagitis: Secondary | ICD-10-CM | POA: Diagnosis not present

## 2020-03-08 DIAGNOSIS — Z836 Family history of other diseases of the respiratory system: Secondary | ICD-10-CM | POA: Insufficient documentation

## 2020-03-08 DIAGNOSIS — Z79899 Other long term (current) drug therapy: Secondary | ICD-10-CM | POA: Diagnosis not present

## 2020-03-08 DIAGNOSIS — Z7982 Long term (current) use of aspirin: Secondary | ICD-10-CM | POA: Insufficient documentation

## 2020-03-08 DIAGNOSIS — Z818 Family history of other mental and behavioral disorders: Secondary | ICD-10-CM | POA: Insufficient documentation

## 2020-03-08 HISTORY — PX: SENTINEL NODE BIOPSY: SHX6608

## 2020-03-08 HISTORY — PX: ROBOTIC ASSISTED TOTAL HYSTERECTOMY WITH BILATERAL SALPINGO OOPHERECTOMY: SHX6086

## 2020-03-08 HISTORY — DX: Malignant (primary) neoplasm, unspecified: C80.1

## 2020-03-08 HISTORY — PX: LYMPHADENECTOMY: SHX5960

## 2020-03-08 LAB — TYPE AND SCREEN
ABO/RH(D): O POS
Antibody Screen: NEGATIVE

## 2020-03-08 SURGERY — HYSTERECTOMY, TOTAL, ROBOT-ASSISTED, LAPAROSCOPIC, WITH BILATERAL SALPINGO-OOPHORECTOMY
Anesthesia: General | Site: Abdomen | Laterality: Right

## 2020-03-08 MED ORDER — OXYCODONE HCL 5 MG PO TABS
5.0000 mg | ORAL_TABLET | Freq: Once | ORAL | Status: AC | PRN
  Administered 2020-03-08: 5 mg via ORAL

## 2020-03-08 MED ORDER — OXYCODONE HCL 5 MG/5ML PO SOLN: 5.0000 mg | Freq: Once | ORAL | Status: AC | PRN

## 2020-03-08 MED ORDER — ENOXAPARIN SODIUM 40 MG/0.4ML ~~LOC~~ SOLN
40.0000 mg | SUBCUTANEOUS | Status: AC
  Administered 2020-03-08: 40 mg via SUBCUTANEOUS

## 2020-03-08 MED ORDER — SCOPOLAMINE 1 MG/3DAYS TD PT72
MEDICATED_PATCH | TRANSDERMAL | Status: AC
  Filled 2020-03-08: qty 1

## 2020-03-08 MED ORDER — DEXAMETHASONE SODIUM PHOSPHATE 10 MG/ML IJ SOLN: 4.0000 mg | INTRAMUSCULAR | Status: DC

## 2020-03-08 MED ORDER — FENTANYL CITRATE (PF) 100 MCG/2ML IJ SOLN
INTRAMUSCULAR | Status: DC | PRN
  Administered 2020-03-08: 100 ug via INTRAVENOUS
  Administered 2020-03-08 (×3): 50 ug via INTRAVENOUS

## 2020-03-08 MED ORDER — CEFAZOLIN SODIUM-DEXTROSE 2-4 GM/100ML-% IV SOLN
INTRAVENOUS | Status: AC
  Filled 2020-03-08: qty 100

## 2020-03-08 MED ORDER — DEXAMETHASONE SODIUM PHOSPHATE 4 MG/ML IJ SOLN
INTRAMUSCULAR | Status: DC | PRN
  Administered 2020-03-08: 10 mg via INTRAVENOUS

## 2020-03-08 MED ORDER — SCOPOLAMINE 1 MG/3DAYS TD PT72: 1.0000 | MEDICATED_PATCH | TRANSDERMAL | Status: DC

## 2020-03-08 MED ORDER — OXYCODONE HCL 5 MG PO TABS
ORAL_TABLET | ORAL | Status: AC
  Filled 2020-03-08: qty 1

## 2020-03-08 MED ORDER — FENTANYL CITRATE (PF) 100 MCG/2ML IJ SOLN
INTRAMUSCULAR | Status: AC
  Filled 2020-03-08: qty 2

## 2020-03-08 MED ORDER — ONDANSETRON HCL 4 MG/2ML IJ SOLN: 4.0000 mg | Freq: Four times a day (QID) | INTRAMUSCULAR | Status: DC | PRN

## 2020-03-08 MED ORDER — CELECOXIB 200 MG PO CAPS
ORAL_CAPSULE | ORAL | Status: AC
  Filled 2020-03-08: qty 2

## 2020-03-08 MED ORDER — DEXAMETHASONE SODIUM PHOSPHATE 10 MG/ML IJ SOLN
INTRAMUSCULAR | Status: AC
  Filled 2020-03-08: qty 1

## 2020-03-08 MED ORDER — CELECOXIB 200 MG PO CAPS
400.0000 mg | ORAL_CAPSULE | ORAL | Status: AC
  Administered 2020-03-08: 400 mg via ORAL

## 2020-03-08 MED ORDER — SUGAMMADEX SODIUM 200 MG/2ML IV SOLN
INTRAVENOUS | Status: DC | PRN
  Administered 2020-03-08: 200 mg via INTRAVENOUS

## 2020-03-08 MED ORDER — PHENYLEPHRINE 40 MCG/ML (10ML) SYRINGE FOR IV PUSH (FOR BLOOD PRESSURE SUPPORT)
PREFILLED_SYRINGE | INTRAVENOUS | Status: DC | PRN
  Administered 2020-03-08 (×2): 80 ug via INTRAVENOUS

## 2020-03-08 MED ORDER — PHENYLEPHRINE 40 MCG/ML (10ML) SYRINGE FOR IV PUSH (FOR BLOOD PRESSURE SUPPORT)
PREFILLED_SYRINGE | INTRAVENOUS | Status: AC
  Filled 2020-03-08: qty 10

## 2020-03-08 MED ORDER — TRAMADOL HCL 50 MG PO TABS: 50.0000 mg | ORAL_TABLET | Freq: Four times a day (QID) | ORAL | Status: DC | PRN

## 2020-03-08 MED ORDER — STERILE WATER FOR INJECTION IJ SOLN
INTRAMUSCULAR | Status: DC | PRN
  Administered 2020-03-08: 10 mL

## 2020-03-08 MED ORDER — ACETAMINOPHEN 500 MG PO TABS
1000.0000 mg | ORAL_TABLET | Freq: Once | ORAL | Status: AC
  Administered 2020-03-08: 1000 mg via ORAL

## 2020-03-08 MED ORDER — ROCURONIUM BROMIDE 10 MG/ML (PF) SYRINGE
PREFILLED_SYRINGE | INTRAVENOUS | Status: AC
  Filled 2020-03-08: qty 10

## 2020-03-08 MED ORDER — SUGAMMADEX SODIUM 200 MG/2ML IV SOLN
INTRAVENOUS | Status: DC | PRN
Start: 2020-03-08 — End: 2020-03-08

## 2020-03-08 MED ORDER — PROPOFOL 10 MG/ML IV BOLUS
INTRAVENOUS | Status: DC | PRN
  Administered 2020-03-08: 180 mg via INTRAVENOUS

## 2020-03-08 MED ORDER — GABAPENTIN 300 MG PO CAPS
ORAL_CAPSULE | ORAL | Status: AC
  Filled 2020-03-08: qty 1

## 2020-03-08 MED ORDER — FENTANYL CITRATE (PF) 250 MCG/5ML IJ SOLN
INTRAMUSCULAR | Status: AC
  Filled 2020-03-08: qty 5

## 2020-03-08 MED ORDER — SODIUM CHLORIDE 0.9 % IR SOLN
Status: DC | PRN
  Administered 2020-03-08: 3000 mL

## 2020-03-08 MED ORDER — LACTATED RINGERS IV SOLN: INTRAVENOUS | Status: DC

## 2020-03-08 MED ORDER — ACETAMINOPHEN 500 MG PO TABS: 1000.0000 mg | ORAL_TABLET | ORAL | Status: DC

## 2020-03-08 MED ORDER — LIDOCAINE 2% (20 MG/ML) 5 ML SYRINGE
INTRAMUSCULAR | Status: AC
  Filled 2020-03-08: qty 5

## 2020-03-08 MED ORDER — PROPOFOL 500 MG/50ML IV EMUL
INTRAVENOUS | Status: AC
  Filled 2020-03-08: qty 50

## 2020-03-08 MED ORDER — SCOPOLAMINE 1 MG/3DAYS TD PT72
1.0000 | MEDICATED_PATCH | Freq: Once | TRANSDERMAL | Status: DC
  Administered 2020-03-08: 1.5 mg via TRANSDERMAL

## 2020-03-08 MED ORDER — MIDAZOLAM HCL 2 MG/2ML IJ SOLN
INTRAMUSCULAR | Status: AC
  Filled 2020-03-08: qty 2

## 2020-03-08 MED ORDER — HYDROMORPHONE HCL 1 MG/ML IJ SOLN: 0.2000 mg | INTRAMUSCULAR | Status: DC | PRN

## 2020-03-08 MED ORDER — ACETAMINOPHEN 500 MG PO TABS
ORAL_TABLET | ORAL | Status: AC
  Filled 2020-03-08: qty 2

## 2020-03-08 MED ORDER — ONDANSETRON HCL 4 MG PO TABS: 4.0000 mg | ORAL_TABLET | Freq: Four times a day (QID) | ORAL | Status: DC | PRN

## 2020-03-08 MED ORDER — ONDANSETRON HCL 4 MG/2ML IJ SOLN
INTRAMUSCULAR | Status: AC
  Filled 2020-03-08: qty 2

## 2020-03-08 MED ORDER — ROCURONIUM BROMIDE 100 MG/10ML IV SOLN
INTRAVENOUS | Status: DC | PRN
  Administered 2020-03-08: 60 mg via INTRAVENOUS
  Administered 2020-03-08: 20 mg via INTRAVENOUS

## 2020-03-08 MED ORDER — KETOROLAC TROMETHAMINE 30 MG/ML IJ SOLN
INTRAMUSCULAR | Status: DC | PRN
  Administered 2020-03-08: 30 mg via INTRAVENOUS

## 2020-03-08 MED ORDER — MIDAZOLAM HCL 5 MG/5ML IJ SOLN
INTRAMUSCULAR | Status: DC | PRN
  Administered 2020-03-08: 2 mg via INTRAVENOUS

## 2020-03-08 MED ORDER — OXYCODONE HCL 5 MG PO TABS: 5.0000 mg | ORAL_TABLET | ORAL | Status: DC | PRN

## 2020-03-08 MED ORDER — KETOROLAC TROMETHAMINE 30 MG/ML IJ SOLN
INTRAMUSCULAR | Status: AC
  Filled 2020-03-08: qty 1

## 2020-03-08 MED ORDER — CEFAZOLIN SODIUM-DEXTROSE 2-4 GM/100ML-% IV SOLN
2.0000 g | INTRAVENOUS | Status: AC
  Administered 2020-03-08: 2 g via INTRAVENOUS

## 2020-03-08 MED ORDER — LIDOCAINE HCL (CARDIAC) PF 100 MG/5ML IV SOSY
PREFILLED_SYRINGE | INTRAVENOUS | Status: DC | PRN
  Administered 2020-03-08: 100 mg via INTRAVENOUS

## 2020-03-08 MED ORDER — ONDANSETRON HCL 4 MG/2ML IJ SOLN
INTRAMUSCULAR | Status: DC | PRN
  Administered 2020-03-08: 4 mg via INTRAVENOUS

## 2020-03-08 MED ORDER — GABAPENTIN 300 MG PO CAPS
300.0000 mg | ORAL_CAPSULE | ORAL | Status: AC
  Administered 2020-03-08: 300 mg via ORAL

## 2020-03-08 MED ORDER — PROMETHAZINE HCL 25 MG/ML IJ SOLN: 6.2500 mg | INTRAMUSCULAR | Status: DC | PRN

## 2020-03-08 MED ORDER — ENOXAPARIN SODIUM 40 MG/0.4ML ~~LOC~~ SOLN
SUBCUTANEOUS | Status: AC
  Filled 2020-03-08: qty 0.4

## 2020-03-08 MED ORDER — FENTANYL CITRATE (PF) 100 MCG/2ML IJ SOLN
25.0000 ug | INTRAMUSCULAR | Status: DC | PRN
  Administered 2020-03-08 (×2): 50 ug via INTRAVENOUS

## 2020-03-08 MED ORDER — BUPIVACAINE HCL 0.25 % IJ SOLN
INTRAMUSCULAR | Status: DC | PRN
  Administered 2020-03-08: 18 mL

## 2020-03-08 SURGICAL SUPPLY — 63 items
ADH SKN CLS APL DERMABOND .7 (GAUZE/BANDAGES/DRESSINGS) ×3
AGENT HMST KT MTR STRL THRMB (HEMOSTASIS) ×3
APL ESCP 34 STRL LF DISP (HEMOSTASIS) ×3
APL PRP STRL LF DISP 70% ISPRP (MISCELLANEOUS) ×3
APPLICATOR SURGIFLO ENDO (HEMOSTASIS) ×1 IMPLANT
BAG SPEC RTRVL LRG 6X4 10 (ENDOMECHANICALS) ×3
CHLORAPREP W/TINT 26 (MISCELLANEOUS) ×1 IMPLANT
COVER BACK TABLE 60X90IN (DRAPES) ×4 IMPLANT
COVER TIP SHEARS 8 DVNC (MISCELLANEOUS) ×3 IMPLANT
COVER TIP SHEARS 8MM DA VINCI (MISCELLANEOUS) ×4
COVER WAND RF STERILE (DRAPES) ×4 IMPLANT
DECANTER SPIKE VIAL GLASS SM (MISCELLANEOUS) ×1 IMPLANT
DERMABOND ADVANCED (GAUZE/BANDAGES/DRESSINGS) ×1
DERMABOND ADVANCED .7 DNX12 (GAUZE/BANDAGES/DRESSINGS) ×3 IMPLANT
DRAPE ARM DVNC X/XI (DISPOSABLE) ×12 IMPLANT
DRAPE COLUMN DVNC XI (DISPOSABLE) ×3 IMPLANT
DRAPE DA VINCI XI ARM (DISPOSABLE) ×16
DRAPE DA VINCI XI COLUMN (DISPOSABLE) ×4
DRAPE SHEET LG 3/4 BI-LAMINATE (DRAPES) ×4 IMPLANT
DRAPE SURG IRRIG POUCH 19X23 (DRAPES) ×4 IMPLANT
ELECT REM PT RETURN 9FT ADLT (ELECTROSURGICAL) ×4
ELECTRODE REM PT RTRN 9FT ADLT (ELECTROSURGICAL) ×3 IMPLANT
GAUZE 4X4 16PLY RFD (DISPOSABLE) ×4 IMPLANT
GLOVE BIO SURGEON STRL SZ 6 (GLOVE) ×16 IMPLANT
GLOVE BIO SURGEON STRL SZ 6.5 (GLOVE) ×8 IMPLANT
GLOVE BIO SURGEON STRL SZ7 (GLOVE) ×2 IMPLANT
GLOVE BIOGEL PI IND STRL 6.5 (GLOVE) IMPLANT
GLOVE BIOGEL PI IND STRL 7.0 (GLOVE) IMPLANT
GLOVE BIOGEL PI INDICATOR 6.5 (GLOVE) ×2
GLOVE BIOGEL PI INDICATOR 7.0 (GLOVE) ×3
GLOVE ECLIPSE 6.5 STRL STRAW (GLOVE) ×2 IMPLANT
HOLDER FOLEY CATH W/STRAP (MISCELLANEOUS) ×4 IMPLANT
IRRIG SUCT STRYKERFLOW 2 WTIP (MISCELLANEOUS) ×4
IRRIGATION SUCT STRKRFLW 2 WTP (MISCELLANEOUS) ×3 IMPLANT
KIT PROCEDURE DA VINCI SI (MISCELLANEOUS) ×4
KIT PROCEDURE DVNC SI (MISCELLANEOUS) IMPLANT
KIT TURNOVER CYSTO (KITS) ×4 IMPLANT
LEGGING LITHOTOMY PAIR STRL (DRAPES) ×4 IMPLANT
MANIFOLD NEPTUNE II (INSTRUMENTS) ×1 IMPLANT
MANIPULATOR UTERINE 4.5 ZUMI (MISCELLANEOUS) ×4 IMPLANT
NDL SPNL 18GX3.5 QUINCKE PK (NEEDLE) IMPLANT
NEEDLE HYPO 22GX1.5 SAFETY (NEEDLE) ×4 IMPLANT
NEEDLE SPNL 18GX3.5 QUINCKE PK (NEEDLE) ×4 IMPLANT
NS IRRIG 500ML POUR BTL (IV SOLUTION) ×1 IMPLANT
OBTURATOR OPTICAL STANDARD 8MM (TROCAR) ×4
OBTURATOR OPTICAL STND 8 DVNC (TROCAR) ×3
OBTURATOR OPTICALSTD 8 DVNC (TROCAR) ×3 IMPLANT
PACK ROBOT GYN WLCUSTOM (TRAY / TRAY PROCEDURE) ×4 IMPLANT
PACK ROBOTIC GOWN (GOWN DISPOSABLE) ×4 IMPLANT
PAD POSITIONING PINK XL (MISCELLANEOUS) ×4 IMPLANT
PORT ACCESS TROCAR AIRSEAL 12 (TROCAR) ×3 IMPLANT
PORT ACCESS TROCAR AIRSEAL 5M (TROCAR) ×1
POUCH SPECIMEN RETRIEVAL 10MM (ENDOMECHANICALS) ×1 IMPLANT
SEAL CANN UNIV 5-8 DVNC XI (MISCELLANEOUS) ×9 IMPLANT
SEAL XI 5MM-8MM UNIVERSAL (MISCELLANEOUS) ×12
SET TRI-LUMEN FLTR TB AIRSEAL (TUBING) ×4 IMPLANT
SURGIFLO W/THROMBIN 8M KIT (HEMOSTASIS) ×1 IMPLANT
SUT VIC AB 4-0 PS2 18 (SUTURE) ×8 IMPLANT
SYR 10ML LL (SYRINGE) ×1 IMPLANT
TRAY FOL W/BAG SLVR 16FR STRL (SET/KITS/TRAYS/PACK) ×3 IMPLANT
TRAY FOLEY W/BAG SLVR 16FR LF (SET/KITS/TRAYS/PACK) ×4
TUBE CONNECTING 12X1/4 (SUCTIONS) ×4 IMPLANT
UNDERPAD 30X30 (UNDERPADS AND DIAPERS) ×4 IMPLANT

## 2020-03-08 NOTE — Progress Notes (Signed)
States "I can't move my left leg. It feels heavy." Unable to adduct left leg without using hands. Able to bend knee without difficulty. No loss of sensation. Able to stand and bear weight, but weak to walk. Wheelchair to Phase 2 Bed 1 from  PACU 3. Dr Denman George aware. No new orders. Will monitor.

## 2020-03-08 NOTE — Interval H&P Note (Signed)
History and Physical Interval Note:  03/08/2020 7:13 AM  Sherry Strong GLOVFIE  has presented today for surgery, with the diagnosis of ENDOMETRIAL CANCER.  The various methods of treatment have been discussed with the patient and family. After consideration of risks, benefits and other options for treatment, the patient has consented to  Procedure(s): XI ROBOTIC ASSISTED TOTAL HYSTERECTOMY WITH BILATERAL SALPINGO OOPHORECTOMY (Bilateral) SENTINEL NODE BIOPSY (N/A) as a surgical intervention.  The patient's history has been reviewed, patient examined, no change in status, stable for surgery.  I have reviewed the patient's chart and labs.  Questions were answered to the patient's satisfaction.     Thereasa Solo

## 2020-03-08 NOTE — Transfer of Care (Signed)
Immediate Anesthesia Transfer of Care Note  Patient: Sherry Strong  Procedure(s) Performed: XI ROBOTIC ASSISTED TOTAL HYSTERECTOMY WITH BILATERAL SALPINGO OOPHORECTOMY (Bilateral Abdomen) SENTINEL NODE BIOPSY (N/A Abdomen) PELVIC LYMPHADENECTOMY (Right Abdomen)  Patient Location: PACU  Anesthesia Type:General  Level of Consciousness: drowsy  Airway & Oxygen Therapy: Patient Spontanous Breathing and Patient connected to face mask oxygen  Post-op Assessment: Report given to RN and Post -op Vital signs reviewed and stable  Post vital signs: Reviewed and stable  Last Vitals:  Vitals Value Taken Time  BP 155/94 03/08/20 1017  Temp    Pulse 74 03/08/20 1019  Resp 21 03/08/20 1019  SpO2 97 % 03/08/20 1019  Vitals shown include unvalidated device data.  Last Pain:  Vitals:   03/08/20 0641  TempSrc: Oral  PainSc: 2       Patients Stated Pain Goal: 5 (92/[id number])  Complications: No complications documented.

## 2020-03-08 NOTE — Progress Notes (Signed)
Ambulated to bathroom without difficulty with one assist. Balance stable, not apparent weakness. Feeling better.

## 2020-03-08 NOTE — Discharge Instructions (Addendum)
03/08/2020  Return to work: 4-6 weeks  Activity: 1. Be up and out of the bed during the day.  Take a nap if needed.  You may walk up steps but be careful and use the hand rail.  Stair climbing will tire you more than you think, you may need to stop part way and rest.   2. No lifting or straining for 4 weeks.  3. No driving for 1 weeks.  Do Not drive if you are taking narcotic pain medicine.  4. Shower daily.  Use soap and water on your incision and pat dry; don't rub.   5. No sexual activity and nothing in the vagina for 8 weeks.  Medications:  - Take ibuprofen and tylenol first line for pain control. Take these regularly (every 6 hours) to decrease the build up of pain.  - If necessary, for severe pain not relieved by ibuprofen, take tramadol.  - While taking tramadol you should take sennakot every night to reduce the likelihood of constipation. If this causes diarrhea, stop its use.  Diet: 1. Low sodium Heart Healthy Diet is recommended.  2. It is safe to use a laxative if you have difficulty moving your bowels.   Wound Care: 1. Keep clean and dry.  Shower daily.  Reasons to call the Doctor:   Fever - Oral temperature greater than 100.4 degrees Fahrenheit  Foul-smelling vaginal discharge  Difficulty urinating  Nausea and vomiting  Increased pain at the site of the incision that is unrelieved with pain medicine.  Difficulty breathing with or without chest pain  New calf pain especially if only on one side  Sudden, continuing increased vaginal bleeding with or without clots.   Follow-up: 1. You have a phone visit with Dr. Denman George on 03/23/2020 and your in person appt will be on July 6.   Contacts: For questions or concerns you should contact:  Dr. Everitt Amber at [phone] After hours and on week-ends call [phone] and ask to speak to the physician on call for Gynecologic Oncology   Post Anesthesia Home Care Instructions  Activity: Get plenty of rest for  the remainder of the day. A responsible individual must stay with you for 24 hours following the procedure.  For the next 24 hours, DO NOT: -Drive a car -Paediatric nurse -Drink alcoholic beverages -Take any medication unless instructed by your physician -Make any legal decisions or sign important papers.  Meals: Start with liquid foods such as gelatin or soup. Progress to regular foods as tolerated. Avoid greasy, spicy, heavy foods. If nausea and/or vomiting occur, drink only clear liquids until the nausea and/or vomiting subsides. Call your physician if vomiting continues.  Special Instructions/Symptoms: Your throat may feel dry or sore from the anesthesia or the breathing tube placed in your throat during surgery. If this causes discomfort, gargle with warm salt water. The discomfort should disappear within 24 hours.  If you had a scopolamine patch placed behind your ear for the management of post- operative nausea and/or vomiting:  1. The medication in the patch is effective for 72 hours, after which it should be removed.  Wrap patch in a tissue and discard in the trash. Wash hands thoroughly with soap and water. 2. You may remove the patch earlier than 72 hours if you experience unpleasant side effects which may include dry mouth, dizziness or visual disturbances. 3. Avoid touching the patch. Wash your hands with soap and water after contact with the patch.    May  remove patch behind ear Thursday, March 11, 2020. May take Tylenol at 3 PM.

## 2020-03-08 NOTE — Anesthesia Postprocedure Evaluation (Signed)
Anesthesia Post Note  Patient: Sherry Strong [id number]  Procedure(s) Performed: XI ROBOTIC ASSISTED TOTAL HYSTERECTOMY WITH BILATERAL SALPINGO OOPHORECTOMY (Bilateral Abdomen) SENTINEL NODE BIOPSY (N/A Abdomen) PELVIC LYMPHADENECTOMY (Right Abdomen)     Patient location during evaluation: PACU Anesthesia Type: General Level of consciousness: awake and alert and oriented Pain management: pain level controlled Vital Signs Assessment: post-procedure vital signs reviewed and stable Respiratory status: spontaneous breathing, nonlabored ventilation and respiratory function stable Cardiovascular status: blood pressure returned to baseline Postop Assessment: no apparent nausea or vomiting Anesthetic complications: no   No complications documented.  Last Vitals:  Vitals:   03/08/20 1030 03/08/20 1045  BP: (!) 153/94 (!) 151/118  Pulse: 74 76  Resp: (!) 21 14  Temp:  36.7 C  SpO2: 99% 100%    Last Pain:  Vitals:   03/08/20 1045  TempSrc:   PainSc: [place]

## 2020-03-08 NOTE — Progress Notes (Signed)
Spoke with Publishing copy in [facility].  Patient is much stronger on the left leg and is able to bear weight and ambulate.  Patient has access to a walker and cane at home.  Plan for discharge later today as planned. Dr. Denman George updated.

## 2020-03-08 NOTE — Op Note (Signed)
OPERATIVE NOTE 03/08/20  Surgeon: Donaciano Eva   Assistants: Joylene John, NP (a provider assistant was necessary for tissue manipulation, management of robotic instrumentation, retraction and positioning due to the complexity of the case and hospital policies).   Anesthesia: General endotracheal anesthesia  ASA Class: 3   Pre-operative Diagnosis: endometrial cancer grade 2  Post-operative Diagnosis: same,   Operation: Robotic-assisted laparoscopic total hysterectomy with bilateral salpingoophorectomy, SLN biopsy, right pelvic lymphadenectomy   Surgeon: Donaciano Eva  Assistant Surgeon: Joylene John, NP  Anesthesia: GET  Urine Output: 100cc  Operative Findings:  : 6cm uterus, extreme abdominal adiposity, normal tubes and ovaries, no gross extrauterine disease, unilateral SLN mapping to left  Estimated Blood Loss:  less than 50 mL      Total IV Fluids: 800 ml         Specimens: uterus, cervix, bilateral tubes and ovaries, right pelvic lymph nodes, left obturator SLN         Complications:  None; patient tolerated the procedure well.         Disposition: PACU - hemodynamically stable.  Procedure Details  The patient was seen in the Holding Room. The risks, benefits, complications, treatment options, and expected outcomes were discussed with the patient.  The patient concurred with the proposed plan, giving informed consent.  The site of surgery properly noted/marked. The patient was identified as Sherry Strong and the procedure verified as a Robotic-assisted hysterectomy with bilateral salpingo oophorectomy with SLN biopsy. A Time Out was held and the above information confirmed.  After induction of anesthesia, the patient was draped and prepped in the usual sterile manner. Pt was placed in supine position after anesthesia and draped and prepped in the usual sterile manner. The abdominal drape was placed after the CholoraPrep had been allowed to dry for 3  minutes.  Her arms were tucked to her side with all appropriate precautions.  The shoulders were stabilized with padded shoulder blocks applied to the acromium processes.  The patient was placed in the semi-lithotomy position in [place].  The perineum was prepped with Betadine. The patient was then prepped. Foley catheter was placed.  A sterile speculum was placed in the vagina.  The cervix was grasped with a single-tooth tenaculum. 2mg  total of ICG was injected into the cervical stroma at 2 and 9 o'clock with 1cc injected at a 1cm and 17mm depth (concentration 0.5mg /ml) in all locations. The cervix was dilated with Kennon Rounds dilators.  The ZUMI uterine manipulator with a medium colpotomizer ring was placed without difficulty.  A pneum occluder balloon was placed over the manipulator.  OG tube placement was confirmed and to suction.   Next, a 5 mm skin incision was made 1 cm below the subcostal margin in the midclavicular line.  The 5 mm Optiview port and scope was used for direct entry.  Opening pressure was under 10 mm CO2.  The abdomen was insufflated and the findings were noted as above.   At this point and all points during the procedure, the patient's intra-abdominal pressure did not exceed 15 mmHg. Next, a 10 mm skin incision was made in the umbilicus and a right and left port was placed about 10 cm lateral to the robot port on the right and left side. All ports were placed under direct visualization.  The patient was placed in steep Trendelenburg.  Bowel was folded away into the upper abdomen.  The robot was docked in the normal manner.  The right and left peritoneum  were opened parallel to the IP ligament to open the retroperitoneal spaces bilaterally. The SLN mapping was performed in bilateral pelvic basins. The para rectal and paravesical spaces were opened up entirely with careful dissection below the level of the ureters bilaterally and to the depth of the uterine artery origin in order to  skeletonize the uterine "web" and ensure visualization of all parametrial channels. The para-aortic basins were carefully exposed and evaluated for isolated para-aortic SLN's. Lymphatic channels were identified travelling to the following visualized sentinel lymph node's: left obturator SLN. These SLN's were separated from their surrounding lymphatic tissue, removed and sent for permanent pathology.  There was unilateral SLN mapping therefore a completion lymphadenectomy was performed. The paravesical space was developed with monopolar and sharp dissection. It was held open with tension on the median umbilical ligament with the forth arm. The paravesical space was opened with blunt and sharp dissection to mobilize the ureter off of the medial surface of the internal iliac artery. The medial leaf of the broad ligament containing the ureter was held medially (opening the pararectal space) by the assistant's grasper. The right pelvic lymphadenectomy was performed by skeletonizing the internal iliac artery at the bifurcation with the external iliac artery. The obturator nerve was identified in the base of lateral paravesical space. The ureter was mobilized medially off of the dissection by developing the pararectal space. The genitofemoral nerve was identified, skeletonized and mobilized laterally off of the external iliac artery. An enbloc resection of lymph nodes was performed within the following boundaries: the mid portion of the common iliac proximally, the circumflex iliac vein distally, the obturator nerve posteriorally, the genitofemoral nerve laterally. The nodal basin (including obturator space) were confirmed to be empty of nodes and hemostatic. The nodes were placed in an endocatch bag and retrieved vaginally.  The hysterectomy was started after the round ligament on the right side was incised and the retroperitoneum was entered and the pararectal space was developed.  The ureter was noted to be on the  medial leaf of the broad ligament.  The peritoneum above the ureter was incised and stretched and the infundibulopelvic ligament was skeletonized, cauterized and cut.  The posterior peritoneum was taken down to the level of the KOH ring.  The anterior peritoneum was also taken down.  The bladder flap was created to the level of the KOH ring.  The uterine artery on the right side was skeletonized, cauterized and cut in the normal manner.  A similar procedure was performed on the left.  The colpotomy was made and the uterus, cervix, bilateral ovaries and tubes were amputated and delivered through the vagina.  Pedicles were inspected and excellent hemostasis was achieved.    The colpotomy at the vaginal cuff was closed with Vicryl on a CT1 needle in running manner.  Irrigation was used and excellent hemostasis was achieved.  At this point in the procedure was completed.  Robotic instruments were removed under direct visulaization.  The robot was undocked. The 10 mm ports were closed with Vicryl on a UR-5 needle and the fascia was closed with 0 Vicryl on a UR-5 needle.  The skin was closed with 4-0 Vicryl in a subcuticular manner.  Dermabond was applied.  Sponge, lap and needle counts correct x 2.  The patient was taken to the recovery room in stable condition.  The vagina was swabbed with  minimal bleeding noted.   All instrument and needle counts were correct x  3.   The patient was  transferred to the recovery room in stable condition.  Donaciano Eva, MD

## 2020-03-08 NOTE — Anesthesia Procedure Notes (Signed)
Procedure Name: Intubation Date/Time: 03/08/2020 8:14 AM Performed by: Lieutenant Diego, CRNA Pre-anesthesia Checklist: Patient identified, Emergency Drugs available, Suction available and Patient being monitored Patient Re-evaluated:Patient Re-evaluated prior to induction Oxygen Delivery Method: Circle system utilized Preoxygenation: Pre-oxygenation with 100% oxygen Induction Type: IV induction Ventilation: Mask ventilation without difficulty Laryngoscope Size: Glidescope Grade View: Grade I Tube type: Oral Number of attempts: 2 Airway Equipment and Method: Stylet and Video-laryngoscopy Placement Confirmation: ETT inserted through vocal cords under direct vision,  positive ETCO2 and breath sounds checked- equal and bilateral Secured at: 21 cm Tube secured with: Tape Dental Injury: Teeth and Oropharynx as per pre-operative assessment  Difficulty Due To: Difficulty was anticipated, Difficult Airway- due to limited oral opening and Difficult Airway- due to anterior larynx Future Recommendations: Recommend- induction with short-acting agent, and alternative techniques readily available

## 2020-03-09 ENCOUNTER — Encounter (HOSPITAL_BASED_OUTPATIENT_CLINIC_OR_DEPARTMENT_OTHER): Payer: Self-pay | Admitting: Gynecologic Oncology

## 2020-03-09 ENCOUNTER — Telehealth: Payer: Self-pay

## 2020-03-09 LAB — URINE CULTURE

## 2020-03-09 NOTE — Telephone Encounter (Signed)
Sherry Strong states that she is eating, drinking, and urinating well. She just got up and needs to begin drinking the water. She has not passed gas. Told her to begin the senokot-S 2 tabs bid with 8 oz of water. She she has no BM by lunch time tomorrow, she needs to add a capful of Miralax bid.  She can adjust the laxative and stool softener if she begins with diarrhea.  Afebrile. Incisions are D&I. She is using ibuprofen and tramadol for pain. Pain level is a 4/10. Her left leg continues to be slow to react. She has to concentrate to make it move the way she wants it to. She states that the control of her leg is improving as the day is passing. She has walked to the BR without walker and gotten up from the commode by her self. Her skin is itchy on her face and right arm where tape was yesterday. No rash noted.  Reviewed above with Melissa Cross,NP. Told her to get Hydrocortisone 1% cream and take either Claritin or zyrtec daily per Melissa Cross,NP.  She is aware of her post-op appointments and the office number [phone] to call if she questions or concerns.

## 2020-03-09 NOTE — Telephone Encounter (Signed)
Told Ms Murdy that this numbness and tingling in the right leg can happen after several lymph nodes removed for pathology. Dr. Denman George said that the numbness will improve but could take several months to resolve.  She is able to stand and walk on right leg without difficulty.

## 2020-03-10 ENCOUNTER — Other Ambulatory Visit: Payer: Self-pay

## 2020-03-12 ENCOUNTER — Telehealth: Payer: Self-pay | Admitting: Gynecologic Oncology

## 2020-03-12 LAB — SURGICAL PATHOLOGY

## 2020-03-12 NOTE — Telephone Encounter (Signed)
Patient stating she is doing well post-operatively and her leg weakness is improving. She is able to ambulate.  Final path discussed along with Dr. Serita Grit recommendations for no adjuvant therapy.  Advised to call for any needs.  Discussed the surveillance program for the next 5 years.

## 2020-03-22 ENCOUNTER — Encounter (HOSPITAL_COMMUNITY): Payer: Self-pay | Admitting: Gynecologic Oncology

## 2020-03-22 ENCOUNTER — Telehealth: Payer: Self-pay | Admitting: Gynecologic Oncology

## 2020-03-23 ENCOUNTER — Inpatient Hospital Stay: Payer: Medicare Other | Attending: Gynecologic Oncology | Admitting: Gynecologic Oncology

## 2020-03-23 ENCOUNTER — Encounter: Payer: Self-pay | Admitting: Gynecologic Oncology

## 2020-03-23 DIAGNOSIS — C541 Malignant neoplasm of endometrium: Secondary | ICD-10-CM

## 2020-03-23 DIAGNOSIS — Z7189 Other specified counseling: Secondary | ICD-10-CM

## 2020-03-23 NOTE — Progress Notes (Signed)
Gynecologic Oncology Telehealth Consult Note: Gyn-Onc  I connected with Sherry Downum [facility] on 03/23/20 at  3:30 PM EDT by telephone and verified that I am speaking with the correct person using two identifiers.  I discussed the limitations, risks, security and privacy concerns of performing an evaluation and management service by telemedicine and the availability of in-person appointments. I also discussed with the patient that there may be a patient responsible charge related to this service. The patient expressed understanding and agreed to proceed.  Other persons participating in the visit and their role in the encounter: none.  Patient's location: home Provider's location: [place]  Chief Complaint:  Chief Complaint  Patient presents with  . endometrial cancer    counseling and coordination of care    Assessment/Plan:  [name]  is a 59 y.o.  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  Pathology revealed low risk factors for recurrence, therefore no adjuvant therapy is recommended according to NCCN guidelines.  I discussed risk for recurrence and typical symptoms encouraged her to notify us of these should they develop between visits.  I recommend she have follow-up every 6 months for 5 years in accordance with NCCN guidelines. Those visits should include symptom assessment, physical exam and pelvic examination. Pap smears are not indicated or recommended in the routine surveillance of endometrial cancer.  Left obturator nerve dysfunction - offered PT. Declined as improving on its own.  Right genitofemoral nerve dysfunction - offered gabapentin - declined as its improving on its own.   HPI: [name] is a 59 year old P1 who was seen in consultation at the request of Dr Ilda Basset for evaluation of Grade 2 endometrioid adenocarcinoma of the endometrium.  The patient reported having symptoms of pink  discharge in light vaginal bleeding that was postmenopausal in nature since December 2020.  She knew she had a primary care appointment scheduled with her primary care physician, Eldridge Abrahams, NP, in February 2021 and therefore waited until this visit to present with symptoms.  At that time she informed Ms. Ronnald Ramp that she was having bleeding and a Pap smear was done.  That Pap smear was normal.  The bleeding symptoms persisted and therefore the patient called back to discuss this in April 2021 with Eldridge Abrahams who promptly referred her to an OB/GYN doctor, Dr. Ilda Basset.  Dr. Ilda Basset saw the patient on Feb 12, 2020 and after learning of symptoms of postmenopausal bleeding immediately performed an endometrial biopsy at that visit.  This revealed endometrioid carcinoma, approaching FIGO grade 2.  The patient reported having a past medical history significant for severe depression and anxiety, hiatal hernia, obesity with a BMI of 38kg/m2, hypertension, prediabetes, and hypercholesterolemia.  She has a reported history of postop nausea and vomiting.  Her surgical history is remarkable for a laparoscopic cholecystectomy and a cesarean section.  She also reported having a laparoscopy approximately 30 years ago for infertility at which time a very small area of endometriosis was identified but not operated on.  She subsequently conceived.  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  Her job involves entertainment, and taking patient to appointments.  It does not involve heavy lifting other than lifting the patient's wheelchair or walker into the car.  Patient lives with her boyfriend who is independent in activities of daily living.  Family history is remarkable for only her mother having a possible history of  cervical dysplasia but no gynecologic, colon, or breast malignancies in the family.  Interval Hx:  On 03/08/20 she underwent robotic assisted total hysterectomy, BSO, SLN mapping and right  pelvic lymphadenectomy. Intraoperative findings were significant for a 6 cm uterus, extreme abdominal adiposity, normal tubes and ovaries, no gross extrauterine disease, unilateral lateral sentinel lymph node mapping to the left. Surgery was uncomplicated.  Final pathology revealed FIGO grade 1 endometrioid endometrial adenocarcinoma focally invading the myometrium for depth of 0.3 cm with a myometrial thickness of 2 cm.  No LVSI.  Negative cervix, negative lymph nodes.  MMR normal, MSI stable.  This pathology represented low risk features for recurrence and therefore no adjuvant therapy was recommended in accordance with NCCN guidelines.  Since surgery she has had concerns with left obturator nerve weakness which is gradually improving over time.  She initially had trouble weightbearing on the left lower extremity and a sensation of weakness on that side but can now move the leg independently including cross the legs.  She also has genitofemoral nerve distribution pain on the right anterior thigh.  This is also getting better with time gradually though still noticeable.   Current Meds:  Outpatient Encounter Medications as of 03/23/2020  Medication Sig  . ALPRAZolam (XANAX) 1 MG tablet Take 1 mg by mouth 3 (three) times daily as needed for anxiety.   . Cholecalciferol (VITAMIN D) 125 MCG (5000 UT) CAPS Take 5,000 Units by mouth daily.  . cyanocobalamin (,VITAMIN B-12,) 1000 MCG/ML injection Inject 1,000 mcg into the muscle every 30 (thirty) days.  . Cyanocobalamin (B-12) 5000 MCG CAPS Take 5,000 mcg by mouth daily.  . fenofibrate (TRICOR) 145 MG tablet Take 145 mg by mouth at bedtime.   Marland Kitchen ibuprofen (ADVIL) 800 MG tablet Take 1 tablet (800 mg total) by mouth every 8 (eight) hours as needed for moderate pain. For AFTER surgery  . lamoTRIgine (LAMICTAL) 150 MG tablet Take 1 tablet (150 mg total) by mouth daily. (Patient not taking: Reported on 02/20/2020)  . lamoTRIgine (LAMICTAL) 200 MG tablet Take 200  mg by mouth at bedtime.   Marland Kitchen LATUDA 60 MG TABS Take 60 mg by mouth daily with supper.   . metoprolol succinate (TOPROL-XL) 25 MG 24 hr tablet Take 25 mg by mouth at bedtime.   . pantoprazole (PROTONIX) 40 MG tablet Take 40 mg by mouth at bedtime.   . pravastatin (PRAVACHOL) 40 MG tablet Take 40 mg by mouth at bedtime.   . senna-docusate (SENOKOT-S) 8.6-50 MG tablet Take 2 tablets by mouth at bedtime. For AFTER surgery, do not take if having diarrhea  . traMADol (ULTRAM) 50 MG tablet Take 1 tablet (50 mg total) by mouth every 6 (six) hours as needed for severe pain. For AFTER surgery, do not take and drive  . traZODone (DESYREL) 100 MG tablet Take 100 mg by mouth at bedtime.   Marland Kitchen zinc gluconate 50 MG tablet Take 50 mg by mouth daily.   No facility-administered encounter medications on file as of 03/23/2020.    Allergy:  Allergies  Allergen Reactions  . Vistaril  [Hydroxyzine Hcl] Swelling    Social Hx:   Social History   Socioeconomic History  . Marital status: Divorced    Spouse name: Not on file  . Number of children: Not on file  . Years of education: Not on file  . Highest education level: Not on file  Occupational History  . 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 Smoking status: Never  Smoker  . Smokeless tobacco: Never Used  Vaping Use  . Vaping Use: Never used  Substance and Sexual Activity  . Alcohol use: Yes    Alcohol/week: 5.0 - 6.0 standard drinks    Types: 5 - 6 Cans of beer per week    Comment: occas   . Drug use: No  . Sexual activity: Not Currently    Birth control/protection: None  Other Topics Concern  . Not on file  Social History Narrative  . Not on file   Social Determinants of Health   Financial Resource Strain:   . Difficulty of Paying Living Expenses:   Food Insecurity:   . Worried About Charity fundraiser in the Last Year:   . Arboriculturist in the Last Year:   Transportation Needs:   . Film/video editor (Medical):   Marland Kitchen Lack of  Transportation (Non-Medical):   Physical Activity:   . Days of Exercise per Week:   . Minutes of Exercise per Session:   Stress:   . Feeling of Stress :   Social Connections:   . Frequency of Communication with Friends and Family:   . Frequency of Social Gatherings with Friends and Family:   . Attends Religious Services:   . Active Member of Clubs or Organizations:   . Attends Archivist Meetings:   Marland Kitchen Marital Status:   Intimate Partner Violence:   . Fear of Current or Ex-Partner:   . Emotionally Abused:   Marland Kitchen Physically Abused:   . Sexually Abused:     Past Surgical Hx:  Past Surgical History:  Procedure Laterality Date  . CESAREAN SECTION    . CHOLECYSTECTOMY N/A 10/05/2017  . ENDOMETRIAL BIOPSY  02/12/2020      . LAPAROSCOPY    . LYMPHADENECTOMY Right 03/08/2020   Procedure: PELVIC LYMPHADENECTOMY;  Surgeon: Everitt Amber, MD;  Location: [facility];  Service: Gynecology;  Laterality: Right;  . ROBOTIC ASSISTED TOTAL HYSTERECTOMY WITH BILATERAL SALPINGO OOPHERECTOMY Bilateral 03/08/2020   Procedure: XI ROBOTIC ASSISTED TOTAL HYSTERECTOMY WITH BILATERAL SALPINGO OOPHORECTOMY;  Surgeon: Everitt Amber, MD;  Location: [place];  Service: Gynecology;  Laterality: Bilateral;  . SENTINEL NODE BIOPSY N/A 03/08/2020   Procedure: SENTINEL NODE BIOPSY;  Surgeon: Everitt Amber, MD;  Location: [facility];  Service: Gynecology;  Laterality: N/A;    Past Medical Hx:  Past Medical History:  Diagnosis Date  . Anxiety   . Bipolar disorder ([place])   . Cancer ([place]) 03/08/2020  . Depression   . GERD (gastroesophageal reflux disease)   . Heart murmur    echo- 08/30/15-epic   . History of hiatal hernia   . HLD (hyperlipidemia)   . HTN (hypertension)   . Infertility, female   . Menopause   . PONV (postoperative nausea and vomiting)     Past Gynecological History:  See HPI Patient's last menstrual period was 01/25/2011.  Family Hx:  Family  History  Problem Relation Age of Onset  . Heart disease Mother   . Depression Mother   . Cancer Mother        precancerous cells on cervix  . Hyperlipidemia Father   . Hypertension Father   . Heart disease Father   . Cancer Father   . Depression Father   . Anxiety disorder Father   . Sleep apnea Father     Review of Systems:  Constitutional  Feels well,    ENT Normal appearing ears and nares bilaterally  Skin/Breast  No rash, sores, jaundice, itching, dryness Cardiovascular  No chest pain, shortness of breath, or edema  Pulmonary  No cough or wheeze.  Gastro Intestinal  No nausea, vomitting, or diarrhoea. No bright red blood per rectum, no abdominal pain, change in bowel movement, or constipation.  Genito Urinary  No frequency, urgency, dysuria,  Musculo Skeletal  No myalgia, arthralgia, joint swelling or pain  Neurologic  + left thigh weakness, + right anterior thigh numbness Psychology  No depression, anxiety, insomnia.   Vitals:  Last menstrual period 01/25/2011.  Physical Exam: Deferred  I discussed the assessment and treatment plan with the patient. The patient was provided with an opportunity to ask questions and all were answered. The patient agreed with the plan and demonstrated an understanding of the instructions.   The patient was advised to call back or see an in-person evaluation if the symptoms worsen or if the condition fails to improve as anticipated.   I provided 20 minutes of non face-to-face telephone visit time during this encounter, and > 50% was spent counseling as documented under my assessment & plan.    Thereasa Solo, MD  03/23/2020, 3:50 PM

## 2020-03-30 ENCOUNTER — Other Ambulatory Visit: Payer: Self-pay

## 2020-03-30 ENCOUNTER — Inpatient Hospital Stay: Payer: Medicare Other | Attending: Gynecologic Oncology | Admitting: Gynecologic Oncology

## 2020-03-30 VITALS — BP 115/76 | HR 71 | Temp 98.6°F | Resp 17 | Ht 63.0 in | Wt 215.7 lb

## 2020-03-30 DIAGNOSIS — Z79899 Other long term (current) drug therapy: Secondary | ICD-10-CM | POA: Diagnosis not present

## 2020-03-30 DIAGNOSIS — I10 Essential (primary) hypertension: Secondary | ICD-10-CM | POA: Insufficient documentation

## 2020-03-30 DIAGNOSIS — Z6838 Body mass index (BMI) 38.0-38.9, adult: Secondary | ICD-10-CM | POA: Diagnosis not present

## 2020-03-30 DIAGNOSIS — F419 Anxiety disorder, unspecified: Secondary | ICD-10-CM | POA: Diagnosis not present

## 2020-03-30 DIAGNOSIS — Z90722 Acquired absence of ovaries, bilateral: Secondary | ICD-10-CM | POA: Insufficient documentation

## 2020-03-30 DIAGNOSIS — R7303 Prediabetes: Secondary | ICD-10-CM | POA: Diagnosis not present

## 2020-03-30 DIAGNOSIS — Z90710 Acquired absence of both cervix and uterus: Secondary | ICD-10-CM

## 2020-03-30 DIAGNOSIS — C55 Malignant neoplasm of uterus, part unspecified: Secondary | ICD-10-CM

## 2020-03-30 DIAGNOSIS — E7800 Pure hypercholesterolemia, unspecified: Secondary | ICD-10-CM | POA: Insufficient documentation

## 2020-03-30 DIAGNOSIS — E669 Obesity, unspecified: Secondary | ICD-10-CM | POA: Insufficient documentation

## 2020-03-30 DIAGNOSIS — K219 Gastro-esophageal reflux disease without esophagitis: Secondary | ICD-10-CM | POA: Diagnosis not present

## 2020-03-30 DIAGNOSIS — E785 Hyperlipidemia, unspecified: Secondary | ICD-10-CM | POA: Insufficient documentation

## 2020-03-30 DIAGNOSIS — C541 Malignant neoplasm of endometrium: Secondary | ICD-10-CM | POA: Diagnosis present

## 2020-03-30 DIAGNOSIS — Z7189 Other specified counseling: Secondary | ICD-10-CM

## 2020-03-30 MED ORDER — IBUPROFEN 800 MG PO TABS: 800.0000 mg | ORAL_TABLET | Freq: Three times a day (TID) | ORAL | 0 refills | Status: DC | PRN

## 2020-03-30 NOTE — Patient Instructions (Signed)
Please notify Dr Denman George at phone number [phone] if you notice vaginal bleeding, new pelvic or abdominal pains, bloating, feeling full easy, or a change in bladder or bowel function.   Please contact Dr Serita Grit office (at [phone]) in October to request an appointment with her for January, 2022.  If your leg weakness persists, she would be happy to make a referral to physical therapy.  Once you run out of your prescription 800mg  ibuprofen, you can start taking 4 regular advil tablets (each is 200mg ).

## 2020-03-30 NOTE — Progress Notes (Signed)
Gynecologic Oncology Follow-up Note  Chief Complaint:  Chief Complaint  Patient presents with  . Endometrial cancer [facility])    Post Op    Assessment/Plan:  [name]  is a 59 y.o.  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  Pathology revealed low risk factors for recurrence, therefore no adjuvant therapy is recommended according to NCCN guidelines.  I discussed risk for recurrence and typical symptoms encouraged her to notify us of these should they develop between visits.  I recommend she have follow-up every 6 months for 5 years in accordance with NCCN guidelines. Those visits should include symptom assessment, physical exam and pelvic examination. Pap smears are not indicated or recommended in the routine surveillance of endometrial cancer.  Left obturator nerve dysfunction - offered PT. Declined as improving on its own.  Right genitofemoral nerve dysfunction - offered gabapentin - declined as its improving on its own.   HPI: [name] is a 59 year old P1 who was seen in consultation at the request of Dr Ilda Basset for evaluation of Grade 2 endometrioid adenocarcinoma of the endometrium.  The patient reported having symptoms of pink discharge in light vaginal bleeding that was postmenopausal in nature since December 2020.  She knew she had a primary care appointment scheduled with her primary care physician, Eldridge Abrahams, NP, in February 2021 and therefore waited until this visit to present with symptoms.  At that time she informed Ms. Ronnald Ramp that she was having bleeding and a Pap smear was done.  That Pap smear was normal.  The bleeding symptoms persisted and therefore the patient called back to discuss this in April 2021 with Eldridge Abrahams who promptly referred her to an OB/GYN doctor, Dr. Ilda Basset.  Dr. Ilda Basset saw the patient on Feb 12, 2020 and after learning of symptoms of postmenopausal bleeding immediately  performed an endometrial biopsy at that visit.  This revealed endometrioid carcinoma, approaching FIGO grade 2.  The patient reported having a past medical history significant for severe depression and anxiety, hiatal hernia, obesity with a BMI of 38kg/m2, hypertension, prediabetes, and hypercholesterolemia.  She has a reported history of postop nausea and vomiting.  Her surgical history is remarkable for a laparoscopic cholecystectomy and a cesarean section.  She also reported having a laparoscopy approximately 30 years ago for infertility at which time a very small area of endometriosis was identified but not operated on.  She subsequently conceived.  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  Her job involves entertainment, and taking patient to appointments.  It does not involve heavy lifting other than lifting the patient's wheelchair or walker into the car.  Patient lives with her boyfriend who is independent in activities of daily living.  Family history is remarkable for only her mother having a possible history of cervical dysplasia but no gynecologic, colon, or breast malignancies in the family.  Interval Hx:  On 03/08/20 she underwent robotic assisted total hysterectomy, BSO, SLN mapping and right pelvic lymphadenectomy. Intraoperative findings were significant for a 6 cm uterus, extreme abdominal adiposity, normal tubes and ovaries, no gross extrauterine disease, unilateral lateral sentinel lymph node mapping to the left. Surgery was uncomplicated.  Final pathology revealed FIGO grade 1 endometrioid endometrial adenocarcinoma focally invading the myometrium for depth of 0.3 cm with a myometrial thickness of 2 cm.  No LVSI.  Negative cervix, negative lymph nodes.  MMR normal, MSI stable.  This pathology represented low  risk features for recurrence and therefore no adjuvant therapy was recommended in accordance with NCCN guidelines.  Since surgery she has had concerns with  left obturator nerve weakness which is gradually improving over time.  She initially had trouble weightbearing on the left lower extremity and a sensation of weakness on that side but can now move the leg independently including cross the legs.  She also has genitofemoral nerve distribution pain on the right anterior thigh.  This is also getting better with time gradually though still noticeable.   Current Meds:  Outpatient Encounter Medications as of 03/30/2020  Medication Sig  . ALPRAZolam (XANAX) 1 MG tablet Take 1 mg by mouth 3 (three) times daily as needed for anxiety.   . Cholecalciferol (VITAMIN D) 125 MCG (5000 UT) CAPS Take 5,000 Units by mouth daily.  . cyanocobalamin (,VITAMIN B-12,) 1000 MCG/ML injection Inject 1,000 mcg into the muscle every 30 (thirty) days.  . Cyanocobalamin (B-12) 5000 MCG CAPS Take 5,000 mcg by mouth daily.  . fenofibrate (TRICOR) 145 MG tablet Take 145 mg by mouth at bedtime.   Marland Kitchen ibuprofen (ADVIL) 800 MG tablet Take 1 tablet (800 mg total) by mouth every 8 (eight) hours as needed for moderate pain. For AFTER surgery  . lamoTRIgine (LAMICTAL) 150 MG tablet Take 1 tablet (150 mg total) by mouth daily. (Patient not taking: Reported on 02/20/2020)  . lamoTRIgine (LAMICTAL) 200 MG tablet Take 200 mg by mouth at bedtime.   Marland Kitchen LATUDA 60 MG TABS Take 60 mg by mouth daily with supper.   . metoprolol succinate (TOPROL-XL) 25 MG 24 hr tablet Take 25 mg by mouth at bedtime.   . pantoprazole (PROTONIX) 40 MG tablet Take 40 mg by mouth at bedtime.   . pravastatin (PRAVACHOL) 40 MG tablet Take 40 mg by mouth at bedtime.   . senna-docusate (SENOKOT-S) 8.6-50 MG tablet Take 2 tablets by mouth at bedtime. For AFTER surgery, do not take if having diarrhea  . traMADol (ULTRAM) 50 MG tablet Take 1 tablet (50 mg total) by mouth every 6 (six) hours as needed for severe pain. For AFTER surgery, do not take and drive (Patient not taking: Reported on 03/30/2020)  . traZODone (DESYREL) 100 MG  tablet Take 100 mg by mouth at bedtime.   Marland Kitchen zinc gluconate 50 MG tablet Take 50 mg by mouth daily.  . [DISCONTINUED] ibuprofen (ADVIL) 800 MG tablet Take 1 tablet (800 mg total) by mouth every 8 (eight) hours as needed for moderate pain. For AFTER surgery   No facility-administered encounter medications on file as of 03/30/2020.    Allergy:  Allergies  Allergen Reactions  . Vistaril  [Hydroxyzine Hcl] Swelling    Social Hx:   Social History   Socioeconomic History  . Marital status: Divorced    Spouse name: Not on file  . Number of children: Not on file  . Years of education: Not on file  . Highest education level: Not on file  Occupational History  . 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 Smoking status: Never Smoker  . Smokeless tobacco: Never Used  Vaping Use  . Vaping Use: Never used  Substance and Sexual Activity  . Alcohol use: Yes    Alcohol/week: 5.0 - 6.0 standard drinks    Types: 5 - 6 Cans of beer per week    Comment: occas   . Drug use: No  . Sexual activity: Not Currently    Birth control/protection: None  Other Topics Concern  .  Not on file  Social History Narrative  . Not on file   Social Determinants of Health   Financial Resource Strain:   . Difficulty of Paying Living Expenses:   Food Insecurity:   . Worried About Charity fundraiser in the Last Year:   . Arboriculturist in the Last Year:   Transportation Needs:   . Film/video editor (Medical):   Marland Kitchen Lack of Transportation (Non-Medical):   Physical Activity:   . Days of Exercise per Week:   . Minutes of Exercise per Session:   Stress:   . Feeling of Stress :   Social Connections:   . Frequency of Communication with Friends and Family:   . Frequency of Social Gatherings with Friends and Family:   . Attends Religious Services:   . Active Member of Clubs or Organizations:   . Attends Archivist Meetings:   Marland Kitchen Marital Status:   Intimate Partner Violence:   . Fear of  Current or Ex-Partner:   . Emotionally Abused:   Marland Kitchen Physically Abused:   . Sexually Abused:     Past Surgical Hx:  Past Surgical History:  Procedure Laterality Date  . CESAREAN SECTION    . CHOLECYSTECTOMY N/A 10/05/2017  . ENDOMETRIAL BIOPSY  02/12/2020      . LAPAROSCOPY    . LYMPHADENECTOMY Right 03/08/2020   Procedure: PELVIC LYMPHADENECTOMY;  Surgeon: Everitt Amber, MD;  Location: [facility];  Service: Gynecology;  Laterality: Right;  . ROBOTIC ASSISTED TOTAL HYSTERECTOMY WITH BILATERAL SALPINGO OOPHERECTOMY Bilateral 03/08/2020   Procedure: XI ROBOTIC ASSISTED TOTAL HYSTERECTOMY WITH BILATERAL SALPINGO OOPHORECTOMY;  Surgeon: Everitt Amber, MD;  Location: [place];  Service: Gynecology;  Laterality: Bilateral;  . SENTINEL NODE BIOPSY N/A 03/08/2020   Procedure: SENTINEL NODE BIOPSY;  Surgeon: Everitt Amber, MD;  Location: [facility];  Service: Gynecology;  Laterality: N/A;    Past Medical Hx:  Past Medical History:  Diagnosis Date  . Anxiety   . Bipolar disorder ([place])   . Cancer ([place]) 03/08/2020  . Depression   . GERD (gastroesophageal reflux disease)   . Heart murmur    echo- 08/30/15-epic   . History of hiatal hernia   . HLD (hyperlipidemia)   . HTN (hypertension)   . Infertility, female   . Menopause   . PONV (postoperative nausea and vomiting)     Past Gynecological History:  See HPI Patient's last menstrual period was 01/25/2011.  Family Hx:  Family History  Problem Relation Age of Onset  . Heart disease Mother   . Depression Mother   . Cancer Mother        precancerous cells on cervix  . Hyperlipidemia Father   . Hypertension Father   . Heart disease Father   . Cancer Father   . Depression Father   . Anxiety disorder Father   . Sleep apnea Father     Review of Systems:  Constitutional  Feels well,    ENT Normal appearing ears and nares bilaterally Skin/Breast  No rash, sores, jaundice, itching,  dryness Cardiovascular  No chest pain, shortness of breath, or edema  Pulmonary  No cough or wheeze.  Gastro Intestinal  No nausea, vomitting, or diarrhoea. No bright red blood per rectum, no abdominal pain, change in bowel movement, or constipation.  Genito Urinary  No frequency, urgency, dysuria,  Musculo Skeletal  No myalgia, arthralgia, joint swelling or pain  Neurologic  + left thigh weakness, +  right anterior thigh numbness Psychology  No depression, anxiety, insomnia.   Vitals:  Blood pressure 115/76, pulse 71, temperature 98.6 F (37 C), temperature source Oral, resp. rate 17, height 5' 3"  (1.6 m), weight 215 lb 11.2 oz (97.8 kg), last menstrual period 01/25/2011, SpO2 98 %.  Physical Exam: WD in NAD Neck  Supple NROM, without any enlargements.  Lymph Node Survey No cervical supraclavicular or inguinal adenopathy Cardiovascular  Pulse normal rate, regularity and rhythm. S1 and S2 normal.  Lungs  Clear to auscultation bilateraly, without wheezes/crackles/rhonchi. Good air movement.  Skin  No rash/lesions/breakdown  Psychiatry  Alert and oriented to person, place, and time  Abdomen  Normoactive bowel sounds, abdomen soft, non-tender and obese without evidence of hernia. Well healed incisions Back No CVA tenderness Genito Urinary  Vulva/vagina: Normal external female genitalia.  No lesions. No discharge or bleeding.  Bladder/urethra:  No lesions or masses, well supported bladder  Vagina: well healed, smooth vaginal cuff free of lesions, no blood, no suture material.  Rectal  deferred Extremities  No bilateral cyanosis, clubbing or edema. Mild difficulty adducting left thigh into stirrup.   30 minutes of direct face to face counseling time was spent with the patient. This included discussion about prognosis, therapy recommendations and postoperative side effects and are beyond the scope of routine postoperative care.   Thereasa Solo, MD  03/30/2020, 3:24 PM

## 2020-03-31 ENCOUNTER — Telehealth: Payer: Self-pay | Admitting: *Deleted

## 2020-03-31 NOTE — Telephone Encounter (Signed)
Called and left the patient a message to call the office back. Mailed her SCP

## 2020-04-19 ENCOUNTER — Telehealth: Payer: Self-pay

## 2020-04-19 NOTE — Telephone Encounter (Signed)
Vaginal spotting began today with wiping with tissue after urinating and small amount on her pad. Told Sherry Strong that 6-8 weeks postop that the sutures in the vaginal cuff dissolve and cause light spotting.  Told Sherry Strong to call if the bleeding becomes heavy like a period.  Pt verbalized understanding.

## 2020-04-25 DIAGNOSIS — Z9889 Other specified postprocedural states: Secondary | ICD-10-CM

## 2020-04-25 HISTORY — DX: Other specified postprocedural states: Z98.890

## 2020-10-07 ENCOUNTER — Other Ambulatory Visit: Payer: Self-pay

## 2020-10-07 ENCOUNTER — Inpatient Hospital Stay: Payer: Medicare Other | Attending: Gynecologic Oncology | Admitting: Gynecologic Oncology

## 2020-10-07 ENCOUNTER — Encounter: Payer: Self-pay | Admitting: Gynecologic Oncology

## 2020-10-07 VITALS — BP 159/84 | HR 77 | Temp 96.8°F | Resp 20 | Ht 63.0 in | Wt 214.0 lb

## 2020-10-07 DIAGNOSIS — G5782 Other specified mononeuropathies of left lower limb: Secondary | ICD-10-CM | POA: Diagnosis not present

## 2020-10-07 DIAGNOSIS — Z90722 Acquired absence of ovaries, bilateral: Secondary | ICD-10-CM | POA: Insufficient documentation

## 2020-10-07 DIAGNOSIS — Z90710 Acquired absence of both cervix and uterus: Secondary | ICD-10-CM | POA: Insufficient documentation

## 2020-10-07 DIAGNOSIS — C541 Malignant neoplasm of endometrium: Secondary | ICD-10-CM | POA: Diagnosis present

## 2020-10-07 NOTE — Progress Notes (Signed)
Gynecologic Oncology Follow-up Note  Chief Complaint:  Chief Complaint  Patient presents with  . Endometrial cancer Sherry Strong)    Assessment/Plan:  [name]  is a 60 y.o.  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  Pathology revealed low risk factors for recurrence, therefore no adjuvant therapy is recommended according to NCCN guidelines.  I discussed risk for recurrence and typical symptoms encouraged her to notify us of these should they develop between visits.  I recommend she have follow-up every 6 months for 5 years in accordance with NCCN guidelines. Those visits should include symptom assessment, physical exam and pelvic examination. Pap smears are not indicated or recommended in the routine surveillance of endometrial cancer.  Left obturator nerve dysfunction - persistent. Recommend PT. She was offered this postop but declined, however is now interested in this.  Right genitofemoral nerve dysfunction - stable, counseled that there is no specific therapy for this. However, if it causes severe pain, it can be treated with gabapentin. However would not recommend this for her symptoms of numbness at present.   HPI: [name] is a 60 year old P1 who was seen in consultation at the request of Dr Sherry Strong for evaluation of Grade 2 endometrioid adenocarcinoma of the endometrium.  The patient reported having symptoms of pink discharge in light vaginal bleeding that was postmenopausal in nature since December 2020.  She knew she had a primary care appointment scheduled with her primary care physician, Sherry Abrahams, NP, in February 2021 and therefore waited until this visit to present with symptoms.  At that time she informed [name] that she was having bleeding and a Pap smear was done.  That Pap smear was normal.  The bleeding symptoms persisted and therefore the patient called back to discuss this in April 2021 with Sherry Strong who promptly referred her to an OB/GYN doctor, Dr. Ilda Strong.  Dr. Ilda Strong saw the patient on Feb 12, 2020 and after learning of symptoms of postmenopausal bleeding immediately performed an endometrial biopsy at that visit.  This revealed endometrioid carcinoma, approaching FIGO grade 2.  The patient reported having a past medical history significant for severe depression and anxiety, hiatal hernia, obesity with a BMI of 38kg/m2, hypertension, prediabetes, and hypercholesterolemia.  She has a reported history of postop nausea and vomiting.  Her surgical history is remarkable for a laparoscopic cholecystectomy and a cesarean section.  She also reported having a laparoscopy approximately 30 years ago for infertility at which time a very small area of endometriosis was identified but not operated on.  She subsequently conceived.  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  Her job involves entertainment, and taking patient to appointments.  It does not involve heavy lifting other than lifting the patient's wheelchair or walker into the car.  Patient lives with her boyfriend who is independent in activities of daily living.  Family history is remarkable for only her mother having a possible history of cervical dysplasia but no gynecologic, colon, or breast malignancies in the family.  Interval Hx:  On 03/08/20 she underwent robotic assisted total hysterectomy, BSO, SLN mapping and right pelvic lymphadenectomy. Intraoperative findings were significant for a 6 cm uterus, extreme abdominal adiposity, normal tubes and ovaries, no gross extrauterine disease, unilateral lateral sentinel lymph node mapping to the left. Surgery was uncomplicated.  Final pathology revealed FIGO grade 1 endometrioid endometrial adenocarcinoma focally invading the myometrium for depth of 0.3  cm with a myometrial thickness of 2 cm.  No LVSI.  Negative cervix, negative lymph nodes.  MMR normal, MSI stable.  This  pathology represented low risk features for recurrence and therefore no adjuvant therapy was recommended in accordance with NCCN guidelines.  Since surgery she has had concerns with left obturator nerve weakness which is stable but bothersome.  She initially had trouble weightbearing on the left lower extremity and a sensation of weakness on that side but can now move the leg independently including cross the legs however continues to feel conscious of stabilizing the knee when walking.  She also has genitofemoral nerve distribution pain on the right anterior thigh.  This has reached a plateau in improvement.   Current Meds:  Outpatient Encounter Medications as of 10/07/2020  Medication Sig  . ALPRAZolam (XANAX) 1 MG tablet Take 1 mg by mouth 3 (three) times daily as needed for anxiety.   . Cholecalciferol (VITAMIN D) 125 MCG (5000 UT) CAPS Take 5,000 Units by mouth daily.  . Cyanocobalamin (B-12) 5000 MCG CAPS Take 5,000 mcg by mouth daily.  . fenofibrate (TRICOR) 145 MG tablet Take 145 mg by mouth at bedtime.   . lamoTRIgine (LAMICTAL) 200 MG tablet Take 200 mg by mouth at bedtime.   Marland Kitchen LATUDA 60 MG TABS Take 60 mg by mouth daily with supper.   . metoprolol succinate (TOPROL-XL) 25 MG 24 hr tablet Take 25 mg by mouth at bedtime.   . pantoprazole (PROTONIX) 40 MG tablet Take 40 mg by mouth at bedtime.   . pravastatin (PRAVACHOL) 40 MG tablet Take 40 mg by mouth at bedtime.   . traZODone (DESYREL) 100 MG tablet Take 100 mg by mouth at bedtime.   Marland Kitchen zinc gluconate 50 MG tablet Take 50 mg by mouth daily.  . cyanocobalamin (,VITAMIN B-12,) 1000 MCG/ML injection Inject 1,000 mcg into the muscle every 30 (thirty) days. (Patient not taking: Reported on 10/07/2020)  . ibuprofen (ADVIL) 800 MG tablet Take 1 tablet (800 mg total) by mouth every 8 (eight) hours as needed for moderate pain. For AFTER surgery (Patient not taking: Reported on 10/07/2020)  . lamoTRIgine (LAMICTAL) 150 MG tablet Take 1 tablet (150  mg total) by mouth daily. (Patient not taking: Reported on 10/07/2020)  . senna-docusate (SENOKOT-S) 8.6-50 MG tablet Take 2 tablets by mouth at bedtime. For AFTER surgery, do not take if having diarrhea (Patient not taking: Reported on 10/07/2020)  . [DISCONTINUED] traMADol (ULTRAM) 50 MG tablet Take 1 tablet (50 mg total) by mouth every 6 (six) hours as needed for severe pain. For AFTER surgery, do not take and drive (Patient not taking: No sig reported)   No facility-administered encounter medications on file as of 10/07/2020.    Allergy:  Allergies  Allergen Reactions  . Vistaril  [Hydroxyzine Hcl] Swelling  . Tramadol Rash    Rash and itching    Social Hx:   Social History   Socioeconomic History  . Marital status: Divorced    Spouse name: Not on file  . Number of children: Not on file  . Years of education: Not on file  . Highest education level: Not on file  Occupational History  . 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 Smoking status: Never Smoker  . Smokeless tobacco: Never Used  Vaping Use  . Vaping Use: Never used  Substance and Sexual Activity  . Alcohol use: Yes    Alcohol/week: 5.0 - 6.0 standard drinks    Types: 5 -  6 Cans of beer per week    Comment: occas   . Drug use: No  . Sexual activity: Not Currently    Birth control/protection: None  Other Topics Concern  . Not on file  Social History Narrative  . Not on file   Social Determinants of Health   Financial Resource Strain: Not on file  Food Insecurity: Not on file  Transportation Needs: Not on file  Physical Activity: Not on file  Stress: Not on file  Social Connections: Not on file  Intimate Partner Violence: Not on file    Past Surgical Hx:  Past Surgical History:  Procedure Laterality Date  . CESAREAN SECTION    . CHOLECYSTECTOMY N/A 10/05/2017  . ENDOMETRIAL BIOPSY  02/12/2020      . LAPAROSCOPY    . LYMPHADENECTOMY Right 03/08/2020   Procedure: PELVIC LYMPHADENECTOMY;  Surgeon:  Everitt Amber, MD;  Location: [facility];  Service: Gynecology;  Laterality: Right;  . ROBOTIC ASSISTED TOTAL HYSTERECTOMY WITH BILATERAL SALPINGO OOPHERECTOMY Bilateral 03/08/2020   Procedure: XI ROBOTIC ASSISTED TOTAL HYSTERECTOMY WITH BILATERAL SALPINGO OOPHORECTOMY;  Surgeon: Everitt Amber, MD;  Location: [place];  Service: Gynecology;  Laterality: Bilateral;  . SENTINEL NODE BIOPSY N/A 03/08/2020   Procedure: SENTINEL NODE BIOPSY;  Surgeon: Everitt Amber, MD;  Location: [facility];  Service: Gynecology;  Laterality: N/A;    Past Medical Hx:  Past Medical History:  Diagnosis Date  . Anxiety   . Bipolar disorder ([place])   . Cancer ([place]) 03/08/2020  . Depression   . GERD (gastroesophageal reflux disease)   . Heart murmur    echo- 08/30/15-epic   . History of hiatal hernia   . HLD (hyperlipidemia)   . HTN (hypertension)   . Hx of colonoscopy 04/2020  . Infertility, female   . Menopause   . PONV (postoperative nausea and vomiting)     Past Gynecological History:  See HPI Patient's last menstrual period was 01/25/2011.  Family Hx:  Family History  Problem Relation Age of Onset  . Heart disease Mother   . Depression Mother   . Cancer Mother        precancerous cells on cervix  . Hyperlipidemia Father   . Hypertension Father   . Heart disease Father   . Cancer Father   . Depression Father   . Anxiety disorder Father   . Sleep apnea Father     Review of Systems:  Constitutional  Feels well,    ENT Normal appearing ears and nares bilaterally Skin/Breast  No rash, sores, jaundice, itching, dryness Cardiovascular  No chest pain, shortness of breath, or edema  Pulmonary  No cough or wheeze.  Gastro Intestinal  No nausea, vomitting, or diarrhoea. No bright red blood per rectum, no abdominal pain, change in bowel movement, or constipation.  Genito Urinary  No frequency, urgency, dysuria,  Musculo Skeletal  No myalgia,  arthralgia, joint swelling or pain  Neurologic  + left thigh weakness, + right anterior thigh numbness Psychology  No depression, anxiety, insomnia.   Vitals:  Blood pressure (!) 159/84, pulse 77, temperature (!) 96.8 F (36 C), temperature source Tympanic, resp. rate 20, height 5' 3"  (1.6 m), weight 214 lb (97.1 kg), last menstrual period 01/25/2011, SpO2 100 %.  Physical Exam: WD in NAD Neck  Supple NROM, without any enlargements.  Lymph Node Survey No cervical supraclavicular or inguinal adenopathy Cardiovascular  Pulse normal rate, regularity and rhythm. S1 and S2 normal.  Lungs  Clear to auscultation bilateraly, without wheezes/crackles/rhonchi. Good air movement.  Skin  No rash/lesions/breakdown  Psychiatry  Alert and oriented to person, place, and time  Abdomen  Normoactive bowel sounds, abdomen soft, non-tender and obese without evidence of hernia. Well healed incisions Back No CVA tenderness Genito Urinary  Vulva/vagina: Normal external female genitalia.  No lesions. No discharge or bleeding.  Bladder/urethra:  No lesions or masses, well supported bladder  Vagina: well healed, smooth vaginal cuff free of lesions, no blood, no suture material.  Rectal  deferred Extremities  No bilateral cyanosis, clubbing or edema. No difficulty adducting left thigh into stirrup. 4/5 left adductor muscle strength against manual resistance.  Thereasa Solo, MD  10/07/2020, 3:43 PM

## 2020-10-07 NOTE — Patient Instructions (Addendum)
Please notify Dr Denman George at phone number [phone] if you notice vaginal bleeding, new pelvic or abdominal pains, bloating, feeling full easy, or a change in bladder or bowel function.   Please contact Dr Serita Grit office (at [phone]) in or after April, 2022 to request an appointment with her for July, 2022.  Dr Denman George is setting you up to see a physical therapist to work on [place] the thigh muscle groups for your left obturator nerve dysfunction.

## 2020-11-04 ENCOUNTER — Ambulatory Visit: Payer: Medicare Other

## 2020-11-11 ENCOUNTER — Encounter: Payer: Self-pay | Admitting: Rehabilitation

## 2020-11-11 ENCOUNTER — Ambulatory Visit: Payer: Medicare Other | Attending: Nurse Practitioner | Admitting: Rehabilitation

## 2020-11-11 DIAGNOSIS — Z483 Aftercare following surgery for neoplasm: Secondary | ICD-10-CM | POA: Diagnosis present

## 2020-11-11 DIAGNOSIS — M6281 Muscle weakness (generalized): Secondary | ICD-10-CM | POA: Diagnosis present

## 2020-11-11 DIAGNOSIS — R208 Other disturbances of skin sensation: Secondary | ICD-10-CM | POA: Insufficient documentation

## 2020-11-11 NOTE — Therapy (Signed)
[place], [place], [address] Phone: [phone]   Fax:  [phone]  Physical Therapy Evaluation  Patient Details  Name: Sherry Strong [id number] MRN: [id number] Date of Birth: 01/16/1961 Referring Provider (PT): Dr. Denman George   Encounter Date: 11/11/2020   PT End of Session - 11/11/20 1445    Visit Number 1    Number of Visits 5    Date for PT Re-Evaluation 12/23/20    Authorization Type MCR    Progress Note Due on Visit 10    PT Start Time 1400    PT Stop Time 1445    PT Time Calculation (min) 45 min    Activity Tolerance Patient tolerated treatment well    Behavior During Therapy [facility] for tasks assessed/performed           Past Medical History:  Diagnosis Date  . Anxiety   . Bipolar disorder ([place])   . Cancer ([place]) 03/08/2020  . Depression   . GERD (gastroesophageal reflux disease)   . Heart murmur    echo- 08/30/15-epic   . History of hiatal hernia   . HLD (hyperlipidemia)   . HTN (hypertension)   . Hx of colonoscopy 04/2020  . Infertility, female   . Menopause   . PONV (postoperative nausea and vomiting)     Past Surgical History:  Procedure Laterality Date  . CESAREAN SECTION    . CHOLECYSTECTOMY N/A 10/05/2017  . ENDOMETRIAL BIOPSY  02/12/2020      . LAPAROSCOPY    . LYMPHADENECTOMY Right 03/08/2020   Procedure: PELVIC LYMPHADENECTOMY;  Surgeon: Everitt Amber, MD;  Location: [facility];  Service: Gynecology;  Laterality: Right;  . ROBOTIC ASSISTED TOTAL HYSTERECTOMY WITH BILATERAL SALPINGO OOPHERECTOMY Bilateral 03/08/2020   Procedure: XI ROBOTIC ASSISTED TOTAL HYSTERECTOMY WITH BILATERAL SALPINGO OOPHORECTOMY;  Surgeon: Everitt Amber, MD;  Location: [place];  Service: Gynecology;  Laterality: Bilateral;  . SENTINEL NODE BIOPSY N/A 03/08/2020   Procedure: SENTINEL NODE BIOPSY;  Surgeon: Everitt Amber, MD;  Location: [facility];  Service: Gynecology;   Laterality: N/A;    There were no vitals filed for this visit.    Subjective Assessment - 11/11/20 1401    Subjective The left leg just feels loose and I am aware of my walking.    Pertinent History endometrial adenocarcinoma with history of total hysterectomy/BSO/right pelvic dissection on 03/08/20 Dr. Denman George .  No other health problems    Limitations --   none   Patient Stated Goals Is there anything I can do for the leg    Currently in Pain? No/denies              [facility] PT Assessment - 11/11/20 0001      Assessment   Medical Diagnosis endometrial adenocarcinoma    Referring Provider (PT) Dr. Denman George    Onset Date/Surgical Date 03/08/20    Hand Dominance Right      Precautions   Precaution Comments lymphedema risk Rt LE      Balance Screen   Has the patient fallen in the past 6 months Yes    How many times? 1   accidental fall   Has the patient had a decrease in activity level because of a fear of falling?  No    Is the patient reluctant to leave their home because of a fear of falling?  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    Leisure no exercise      Cognition   Overall Cognitive Status Within Functional Limits for tasks assessed      Sensation   Additional Comments numbness Rt lateral hip from surgery      ROM / Strength   AROM / PROM / Strength Strength      Strength   Strength Assessment Site Hip;Knee    Right/Left Hip Right;Left    Right Hip Flexion 4-/5    Right Hip External Rotation  4/5    Right Hip Internal Rotation 4/5    Right Hip ABduction 4/5    Right Hip ADduction 4-/5    Left Hip Flexion 4/5    Left Hip External Rotation 4+/5    Left Hip Internal Rotation 4+/5     Left Hip ABduction 4/5    Left Hip ADduction 4/5    Right/Left Knee Right;Left    Right Knee Flexion 4+/5    Right Knee Extension 4+/5    Left Knee Flexion 4/5    Left Knee Extension 4/5      Flexibility   Soft Tissue Assessment /Muscle Length yes   pull felt with adductor stretch   Hamstrings 45 R,  75 L    Piriformis able to feel pull      Palpation   Palpation comment sensation intact      Ambulation/Gait   Gait Comments no deviations noted but pt reports that the leg just feels "loose"      High Level Balance   High Level Balance Comments SL stance R: 10" L: 5" no difference in tandem stance                      Objective measurements completed on examination: See above findings.               PT Education - 11/11/20 1445    Education Details POC, HEP    Person(s) Educated Patient    Methods Demonstration;Explanation;Tactile cues;Verbal cues;Handout    Comprehension Verbalized understanding;Returned demonstration;Verbal cues required;Tactile cues required               PT Long Term Goals - 11/11/20 1455      PT LONG TERM GOAL #1   Title Pt will improve left hip flexibility testing of the left hip to no inner groin pull (hamstring, SKTC, piriformis)    Baseline pull with all    Time 6    Period Weeks    Status New      PT LONG TERM GOAL #2   Title Pt will improve supine SLR to feeling equal to the opposite leg    Baseline feels around 75%    Time 6    Period Weeks    Status New      PT LONG TERM GOAL #3   Title Pt will be able to perform SLS x 10 seconds on the Lt LE to equal the well leg    Baseline 5sec    Time 6    Period Weeks    Status New  Plan - 11/11/20 1446    Clinical Impression Statement Pt presents with obturator nerve dysfunction post pelvic dissesction on 03/08/20.  Pt notes the left LE feels "loose" when she is walking and demonstrates weakness with hip flexion, abduction, adduction, ER  as well as pull in the medial thigh with most stretches of the hip.  single leg balance is also decreased by around 50%.  Pt will benefit from PT at this time to attempt left hip and groin strengthening.    Personal Factors and Comorbidities Age;Comorbidity 1    Comorbidities PLND    Stability/Clinical Decision Making Stable/Uncomplicated    Clinical Decision Making Low    Rehab Potential Good    PT Frequency 1x / week    PT Duration 6 weeks    PT Treatment/Interventions ADLs/Self Care Home Management;Therapeutic exercise;Patient/family education;Neuromuscular re-education;Gait training;Balance training    PT Next Visit Plan any improvements with HEP? review HEP and consider adding 3 or 4 way SLR, add some balance, consider trying nerve flossing    PT Home Exercise Plan Access Code: [email or web address]    [email or web address] and Agree with Plan of Care Patient           Patient will benefit from skilled therapeutic intervention in order to improve the following deficits and impairments:  Decreased strength,Difficulty walking  Visit Diagnosis: Other disturbances of skin sensation  Aftercare following surgery for neoplasm  Muscle weakness (generalized)     Problem List Patient Active Problem List   Diagnosis Date Noted  . Neuropathy of left obturator nerve 10/07/2020  . Endometrial cancer ([place]) 03/08/2020  . Postmenopausal bleeding 02/13/2020  . Class 1 obesity with serious comorbidity and body mass index (BMI) of 33.0 to 33.9 in adult 08/15/2018  . Cholecystitis, acute with cholelithiasis 10/05/2017  . Insulin resistance 08/15/2017  . Other fatigue 07/12/2017  . Shortness of breath on exertion 07/12/2017  . Essential hypertension 07/12/2017  . Other hyperlipidemia 07/12/2017  . B12 nutritional deficiency 07/12/2017  . Bipolar depression ([place]) 07/12/2017  . Vitamin D deficiency 07/12/2017  . Tremor 12/14/2014  . Gastroesophageal reflux disease without esophagitis 08/10/2014  .  HYPERCHOLESTEROLEMIA, PURE 10/21/2008  . GERD 10/21/2008    Stark Bray 11/11/2020, 2:58 PM  [place], [place], [address] Phone: [phone]   Fax:  [phone]  Name: Sherry Strong MRN: [id number] Date of Birth: Jun 12, 1961

## 2020-11-11 NOTE — Patient Instructions (Signed)
Access Code: [email or web address]  URL: [email or web address]: 02/17/2022Prepared by: Marcene Brawn TevisExercises  Supine Single Knee to Chest Stretch - 1 x daily - 7 x weekly - 1 sets - 3 reps - 20 second hold  Supine Hip Adductor Stretch - 1 x daily - 7 x weekly - 1 sets - 3 reps - 10-20second hold  Supine Hamstring Stretch with Strap - 1 x daily - 7 x weekly - 1 sets - 3 reps - 20 second hold  Supine Piriformis Stretch Pulling Heel to Hip - 1 x daily - 7 x weekly - 1 sets - 3 reps - 20 second hold  Bridge - 1 x daily - 7 x weekly - 1-3 sets - 10 reps - 3 seconds hold  Seated Hip Adduction Isometrics with Ball - 1 x daily - 7 x weekly - 1-3 sets - 6 reps - 6 seconds hold

## 2020-11-18 ENCOUNTER — Ambulatory Visit: Payer: Medicare Other

## 2020-11-25 ENCOUNTER — Ambulatory Visit: Payer: Medicare Other | Attending: Nurse Practitioner | Admitting: Rehabilitation

## 2021-04-04 ENCOUNTER — Telehealth: Payer: Self-pay | Admitting: *Deleted

## 2021-04-04 NOTE — Telephone Encounter (Signed)
Called and moved her appt on 7/14 to the morning

## 2021-04-06 ENCOUNTER — Encounter: Payer: Self-pay | Admitting: Gynecologic Oncology

## 2021-04-07 ENCOUNTER — Other Ambulatory Visit: Payer: Self-pay

## 2021-04-07 ENCOUNTER — Inpatient Hospital Stay: Payer: Medicare Other | Attending: Gynecologic Oncology | Admitting: Gynecologic Oncology

## 2021-04-07 VITALS — BP 164/92 | HR 89 | Temp 98.3°F | Resp 20 | Ht 63.0 in | Wt 219.0 lb

## 2021-04-07 DIAGNOSIS — Z90710 Acquired absence of both cervix and uterus: Secondary | ICD-10-CM | POA: Insufficient documentation

## 2021-04-07 DIAGNOSIS — C541 Malignant neoplasm of endometrium: Secondary | ICD-10-CM | POA: Diagnosis not present

## 2021-04-07 DIAGNOSIS — Z8542 Personal history of malignant neoplasm of other parts of uterus: Secondary | ICD-10-CM | POA: Diagnosis present

## 2021-04-07 DIAGNOSIS — Z90722 Acquired absence of ovaries, bilateral: Secondary | ICD-10-CM | POA: Insufficient documentation

## 2021-04-07 NOTE — Patient Instructions (Signed)
Please notify Dr Denman George at phone number [phone] if you notice vaginal bleeding, new pelvic or abdominal pains, bloating, feeling full easy, or a change in bladder or bowel function.   Dr Denman George is departing the [place] [facility] in October, 2022. Her partners and colleagues including Dr Berline Lopes, Dr Delsa Sale and Joylene John, Nurse Practitioner will be available to continue your care.   You are next scheduled to return to the Gynecologic Oncology office at the [facility] in January, 2023. Please call [phone] in November to request an appointment for Dr Delsa Sale with Dr Serita Grit partner.

## 2021-04-07 NOTE — Progress Notes (Signed)
Gynecologic Oncology Follow-up Note  Chief Complaint:  Chief Complaint  Patient presents with   Endometrial cancer [facility])    Assessment/Plan:  [name]  is a 60 y.o.  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 Free of disease on examination.  Pathology revealed low risk factors for recurrence, therefore no adjuvant therapy is recommended according to NCCN guidelines.  I discussed risk for recurrence and typical symptoms encouraged her to notify us of these should they develop between visits.  I recommend she continue to have follow-up every 6 months for 5 years in accordance with NCCN guidelines. Those visits should include symptom assessment, physical exam and pelvic examination. Pap smears are not indicated or recommended in the routine surveillance of endometrial cancer. She will follow-up with my partner, Dr Delsa Sale, as I will be leaving the practice in October.  Left obturator nerve dysfunction - resolved.   Right genitofemoral nerve dysfunction - stable, counseled that there is no specific therapy for this. However, if it causes severe pain, it can be treated with gabapentin. However would not recommend this for her symptoms of numbness at present.   HPI: [name] is a 60 year old P1 who was seen in consultation at the request of Dr Ilda Basset for evaluation of Grade 2 endometrioid adenocarcinoma of the endometrium.  The patient reported having symptoms of pink discharge in light vaginal bleeding that was postmenopausal in nature since December 2020.  She knew she had a primary care appointment scheduled with her primary care physician, Sherry Abrahams, NP, in February 2021 and therefore waited until this visit to present with symptoms.  At that time she informed [name] that she was having bleeding and a Pap smear was done.  That Pap smear was normal.  The bleeding symptoms persisted and therefore the patient  called back to discuss this in April 2021 with Sherry Strong who promptly referred her to an OB/GYN doctor, Dr. Ilda Basset.  Dr. Ilda Basset saw the patient on Feb 12, 2020 and after learning of symptoms of postmenopausal bleeding immediately performed an endometrial biopsy at that visit.  This revealed endometrioid carcinoma, approaching FIGO grade 2.  On 03/08/20 she underwent robotic assisted total hysterectomy, BSO, SLN mapping and right pelvic lymphadenectomy. Intraoperative findings were significant for a 6 cm uterus, extreme abdominal adiposity, normal tubes and ovaries, no gross extrauterine disease, unilateral lateral sentinel lymph node mapping to the left. Surgery was uncomplicated.  Final pathology revealed FIGO grade 1 endometrioid endometrial adenocarcinoma focally invading the myometrium for depth of 0.3 cm with a myometrial thickness of 2 cm.  No LVSI.  Negative cervix, negative lymph nodes.  MMR normal, MSI stable.  This pathology represented low risk features for recurrence and therefore no adjuvant therapy was recommended in accordance with NCCN guidelines.  Interval Hx:  After surgery she reported left obturator nerve weakness and right genitofemoral nerve dysfunction. The obturator nerve weakness resolved spontaneously. The genitofemoral nerve dysfunction remained stable.  She has no symptoms concerning for recurrence.    Current Meds:  Outpatient Encounter Medications as of 04/07/2021  Medication Sig   ALPRAZolam (XANAX) 1 MG tablet Take 1 mg by mouth 3 (three) times daily as needed for anxiety.    aspirin 81 MG EC tablet Take 1 tablet by mouth daily.   Cholecalciferol (VITAMIN D) 125 MCG (5000 UT) CAPS Take 5,000 Units by mouth daily.   Cyanocobalamin (B-12) 5000 MCG CAPS Take 5,000 mcg by  mouth daily.   Elderberry 500 MG CAPS Take 1 capsule by mouth daily.   fenofibrate (TRICOR) 145 MG tablet Take 145 mg by mouth at bedtime.    lamoTRIgine (LAMICTAL) 200 MG tablet Take 200 mg by  mouth at bedtime.    LATUDA 60 MG TABS Take 60 mg by mouth daily with supper.    pantoprazole (PROTONIX) 40 MG tablet Take 40 mg by mouth at bedtime.    pravastatin (PRAVACHOL) 80 MG tablet Take 80 mg by mouth daily.   traZODone (DESYREL) 150 MG tablet Take 150 mg by mouth at bedtime.   zinc gluconate 50 MG tablet Take 50 mg by mouth daily.   [DISCONTINUED] lamoTRIgine (LAMICTAL) 150 MG tablet Take 1 tablet (150 mg total) by mouth daily.   [DISCONTINUED] pravastatin (PRAVACHOL) 40 MG tablet Take 40 mg by mouth at bedtime.    metoprolol succinate (TOPROL-XL) 25 MG 24 hr tablet Take 25 mg by mouth at bedtime.    [DISCONTINUED] cyanocobalamin (,VITAMIN B-12,) 1000 MCG/ML injection Inject 1,000 mcg into the muscle every 30 (thirty) days. (Patient not taking: No sig reported)   [DISCONTINUED] ibuprofen (ADVIL) 800 MG tablet Take 1 tablet (800 mg total) by mouth every 8 (eight) hours as needed for moderate pain. For AFTER surgery (Patient not taking: Reported on 10/07/2020)   [DISCONTINUED] senna-docusate (SENOKOT-S) 8.6-50 MG tablet Take 2 tablets by mouth at bedtime. For AFTER surgery, do not take if having diarrhea (Patient not taking: Reported on 10/07/2020)   No facility-administered encounter medications on file as of 04/07/2021.    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  Tobacco Use   Smoking status: Never   Smokeless tobacco: Never  Vaping Use   Vaping Use: Never used  Substance and Sexual Activity   Alcohol use: Yes    Alcohol/week: 5.0 - 6.0 standard drinks    Types: 5 - 6 Cans of beer per week    Comment: occas    Drug use: No    Sexual activity: Not Currently    Birth control/protection: None  Other Topics Concern   Not on file  Social History Narrative   Not on file   Social Determinants of Health   Financial Resource Strain: Not on file  Food Insecurity: Not on file  Transportation Needs: Not on file  Physical Activity: Not on file  Stress: Not on file  Social Connections: Not on file  Intimate Partner Violence: Not on file    Past Surgical Hx:  Past Surgical History:  Procedure Laterality Date   CESAREAN SECTION     CHOLECYSTECTOMY N/A 10/05/2017   ENDOMETRIAL BIOPSY  02/12/2020       LAPAROSCOPY     LYMPHADENECTOMY Right 03/08/2020   Procedure: PELVIC LYMPHADENECTOMY;  Surgeon: Everitt Amber, MD;  Location: [place];  Service: Gynecology;  Laterality: Right;   ROBOTIC ASSISTED TOTAL HYSTERECTOMY WITH BILATERAL SALPINGO OOPHERECTOMY Bilateral 03/08/2020   Procedure: XI ROBOTIC ASSISTED TOTAL HYSTERECTOMY WITH BILATERAL SALPINGO OOPHORECTOMY;  Surgeon: Everitt Amber, MD;  Location: [facility];  Service:  Gynecology;  Laterality: Bilateral;   SENTINEL NODE BIOPSY N/A 03/08/2020   Procedure: SENTINEL NODE BIOPSY;  Surgeon: Everitt Amber, MD;  Location[facility];  Service: Gynecology;  Laterality: N/A;    Past Medical Hx:  Past Medical History:  Diagnosis Date   Anxiety    Bipolar disorder ([place])    Cancer ([place]) 03/08/2020   Depression    GERD (gastroesophageal reflux disease)    Heart murmur    echo- 08/30/15-epic    History of hiatal hernia    HLD (hyperlipidemia)    HTN (hypertension)    Hx of colonoscopy 04/2020   Infertility, female    Menopause    PONV (postoperative nausea and vomiting)     Past Gynecological History:  See HPI Patient's last menstrual period was 01/25/2011.  Family Hx:  Family History  Problem Relation Age of Onset   Heart disease Mother    Depression Mother    Cancer Mother        precancerous cells on cervix    Hyperlipidemia Father    Hypertension Father    Heart disease Father    Cancer Father    Depression Father    Anxiety disorder Father    Sleep apnea Father     Review of Systems:  Constitutional  Feels well,    ENT Normal appearing ears and nares bilaterally Skin/Breast  No rash, sores, jaundice, itching, dryness Cardiovascular  No chest pain, shortness of breath, or edema  Pulmonary  No cough or wheeze.  Gastro Intestinal  No nausea, vomitting, or diarrhoea. No bright red blood per rectum, no abdominal pain, change in bowel movement, or constipation.  Genito Urinary  No frequency, urgency, dysuria,  Musculo Skeletal  No myalgia, arthralgia, joint swelling or pain  Neurologic  + left thigh weakness, + right anterior thigh numbness Psychology  No depression, anxiety, insomnia.   Vitals:  Blood pressure (!) 164/92, pulse 89, temperature 98.3 F (36.8 C), temperature source Tympanic, resp. rate 20, height 5' 3"  (1.6 m), weight 219 lb (99.3 kg), last menstrual period 01/25/2011, SpO2 96 %.  Physical Exam: WD in NAD Neck  Supple NROM, without any enlargements.  Lymph Node Survey No cervical supraclavicular or inguinal adenopathy Cardiovascular  Pulse normal rate, regularity and rhythm. S1 and S2 normal.  Lungs  Clear to auscultation bilateraly, without wheezes/crackles/rhonchi. Good air movement.  Skin  No rash/lesions/breakdown  Psychiatry  Alert and oriented to person, place, and time  Abdomen  Normoactive bowel sounds, abdomen soft, non-tender and obese without evidence of hernia. Well healed incisions Back No CVA tenderness Genito Urinary  Vulva/vagina: Normal external female genitalia.  No lesions. No discharge or bleeding.  Bladder/urethra:  No lesions or masses, well supported bladder  Vagina: well healed, smooth vaginal cuff free of lesions, no blood, no suture material.  Rectal  deferred Extremities  No bilateral cyanosis, clubbing or edema.  Thereasa Solo, MD  04/07/2021, 11:50 AM

## 2021-10-04 ENCOUNTER — Encounter: Payer: Self-pay | Admitting: Obstetrics & Gynecology

## 2021-10-04 ENCOUNTER — Telehealth: Payer: Self-pay | Admitting: *Deleted

## 2021-10-04 NOTE — Telephone Encounter (Signed)
Patient called and scheduled a follow up appt with Dr Jackson-Moore  

## 2021-10-05 NOTE — Progress Notes (Unsigned)
Follow Up Note: Gyn-Onc  Sherry Strong OVZCHYI 61 y.o. female  CC:    HPI: The oncology history was reviewed.  Interval History: ***  Review of Systems  Review of Systems  Constitutional:  Negative for malaise/fatigue and weight loss.  Respiratory:  Negative for shortness of breath and wheezing.   Cardiovascular:  Negative for chest pain and leg swelling.  Gastrointestinal:  Negative for abdominal pain, blood in stool, constipation, nausea and vomiting.  Genitourinary:  Negative for dysuria, frequency, hematuria and urgency.  Musculoskeletal:  Negative for joint pain and myalgias.  Neurological:  Negative for weakness.  Psychiatric/Behavioral:  Negative for depression. The patient does not have insomnia.    Current medications, allergy, social history, past surgical history, past medical history, family history were all reviewed.    Vitals:  Last menstrual period 01/25/2011.  Physical Exam:  Physical Exam Exam conducted with a chaperone present.  Constitutional:      General: She is not in acute distress. Cardiovascular:     Rate and Rhythm: Normal rate and regular rhythm.  Pulmonary:     Effort: Pulmonary effort is normal.     Breath sounds: Normal breath sounds. No wheezing or rhonchi.  Abdominal:     Palpations: Abdomen is soft.     Tenderness: There is no abdominal tenderness. There is no right CVA tenderness or left CVA tenderness.     Hernia: No hernia is present.  Genitourinary:    General: Normal vulva.     Urethra: No urethral lesion.     Vagina: No lesions. No bleeding Musculoskeletal:     Cervical back: Neck supple.     Right lower leg: No edema.     Left lower leg: No edema.  Lymphadenopathy:     Upper Body:     Right upper body: No supraclavicular adenopathy.     Left upper body: No supraclavicular adenopathy.     Lower Body: No right inguinal adenopathy. No left inguinal adenopathy.  Skin:    Findings: No rash.  Neurological:     Mental Status: She is  oriented to person, place, and time.   Assessment/Plan: ***   Lahoma Crocker, MD

## 2021-10-05 NOTE — Assessment & Plan Note (Signed)
Sherry Strong  is a 61 y.o.  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  Pathology revealed low risk factors for recurrence,  Negative symptom review, normal exam.  No evidence of recurrence  >continue to have follow-up every 6 months for 5 years in accordance with NCCN guidelines.

## 2021-10-06 ENCOUNTER — Inpatient Hospital Stay: Payer: Medicare Other | Admitting: Obstetrics & Gynecology

## 2021-10-06 ENCOUNTER — Inpatient Hospital Stay: Payer: Medicare Other | Attending: Obstetrics & Gynecology | Admitting: Obstetrics & Gynecology

## 2021-10-06 ENCOUNTER — Other Ambulatory Visit: Payer: Self-pay

## 2021-10-06 ENCOUNTER — Encounter: Payer: Self-pay | Admitting: Obstetrics & Gynecology

## 2021-10-06 VITALS — BP 148/83 | HR 88 | Temp 97.9°F | Resp 18 | Ht 63.0 in | Wt 222.0 lb

## 2021-10-06 DIAGNOSIS — Z8542 Personal history of malignant neoplasm of other parts of uterus: Secondary | ICD-10-CM | POA: Diagnosis present

## 2021-10-06 DIAGNOSIS — C541 Malignant neoplasm of endometrium: Secondary | ICD-10-CM

## 2021-10-06 DIAGNOSIS — G629 Polyneuropathy, unspecified: Secondary | ICD-10-CM | POA: Diagnosis not present

## 2021-10-06 NOTE — Progress Notes (Signed)
Follow Up Note: Gyn-Onc   Sherry Strong 61 y.o. female   CC: She returns for a f/u visit     HPI: The oncology history was reviewed.   Interval History: She reports less numbness on right outer thigh/hip area. No pain.  She denies any vaginal bleeding, abdominal/pelvic pain, cough, lethargy or increasing abdominal girth.    Review of Systems  Review of Systems  Constitutional:  Negative for malaise/fatigue and weight loss.  Respiratory:  Negative for shortness of breath and wheezing.   Cardiovascular:  Negative for chest pain and leg swelling.  Gastrointestinal:  Negative for abdominal pain, blood in stool, constipation, nausea and vomiting.  Genitourinary:  Negative for dysuria, frequency, hematuria and urgency.  Musculoskeletal:  Negative for joint pain and myalgias.  Neurological:  Negative for weakness.  Psychiatric/Behavioral:  Negative for depression. The patient does not have insomnia.     Current medications, allergy, social history, past surgical history, past medical history, family history were all reviewed.       Vitals:  BP (!) 148/83 (BP Location: Left Arm, Patient Position: Sitting)    Pulse 88    Temp 97.9 F (36.6 C) (Tympanic)    Resp 18    Ht 5\' 3"  (1.6 m)    Wt 222 lb (100.7 kg)    LMP 01/25/2011    SpO2 100%    BMI 39.33 kg/m     Physical Exam:  Physical Exam Exam conducted with a chaperone present.  Constitutional:      General: She is not in acute distress. Cardiovascular:     Rate and Rhythm: Normal rate and regular rhythm.  Pulmonary:     Effort: Pulmonary effort is normal.     Breath sounds: Normal breath sounds. No wheezing or rhonchi.  Abdominal:     Palpations: Abdomen is soft.     Tenderness: There is no abdominal tenderness. There is no right CVA tenderness or left CVA tenderness.     Hernia: No hernia is present.  Genitourinary:    General: Normal vulva.     Urethra: No urethral lesion.     Vagina: No lesions. No  bleeding Musculoskeletal:     Cervical back: Neck supple.     Right lower leg: No edema.     Left lower leg: No edema.  Lymphadenopathy:     Upper Body:     Right upper body: No supraclavicular adenopathy.     Left upper body: No supraclavicular adenopathy.     Lower Body: No right inguinal adenopathy. No left inguinal adenopathy.  Skin:    Findings: No rash.  Neurological:     Mental Status: She is oriented to person, place, and time.    Assessment/Plan:  Endometrial cancer [facility]) [name] is a 61 yo w/stage IA FIGO Gr 1 EC  Genitofemoral neuropathy improving Negative symptom review, normal exam.  No evidence of recurrence.  >continue f/u q 6 mos in accordance w/NCCN guidelines      Lahoma Crocker, MD

## 2021-10-06 NOTE — Assessment & Plan Note (Signed)
Sherry Strong is a 61 yo w/stage IA FIGO Gr 1 EC  Genitofemoral neuropathy improving Negative symptom review, normal exam.  No evidence of recurrence.  >continue f/u q 6 mos in accordance w/NCCN guidelines

## 2021-10-06 NOTE — Patient Instructions (Signed)
Return in 1 yr 

## 2022-01-20 ENCOUNTER — Other Ambulatory Visit: Payer: Self-pay | Admitting: Sports Medicine

## 2022-01-20 ENCOUNTER — Ambulatory Visit
Admission: RE | Admit: 2022-01-20 | Discharge: 2022-01-20 | Disposition: A | Discharge status: Home / Self Care | Payer: Medicare Other | Source: Ambulatory Visit | Attending: Sports Medicine | Admitting: Sports Medicine

## 2022-01-20 DIAGNOSIS — M25562 Pain in left knee: Secondary | ICD-10-CM

## 2022-03-06 ENCOUNTER — Telehealth: Payer: Self-pay | Admitting: *Deleted

## 2022-03-06 NOTE — Telephone Encounter (Signed)
Attempted to speak to pt to schedule her 6 month follow up appt with Dr.Jackson Moore. She stated she can only come on Mondays due to starting a new job. She'll need to see Melissa for a follow up when sch. LVM fore return call.

## 2022-03-06 NOTE — Telephone Encounter (Signed)
Pt scheduled with Sherry Strong on July 3, 23 @ 3:30pm because she says Monday at or after 3:30pm works best for her due to her work schedule.

## 2022-03-27 ENCOUNTER — Other Ambulatory Visit: Payer: Self-pay

## 2022-03-27 ENCOUNTER — Inpatient Hospital Stay: Payer: Medicare Other | Attending: Gynecologic Oncology | Admitting: Gynecologic Oncology

## 2022-03-27 VITALS — BP 129/77 | HR 93 | Temp 97.8°F | Resp 18 | Ht 63.0 in | Wt 209.0 lb

## 2022-03-27 DIAGNOSIS — Z780 Asymptomatic menopausal state: Secondary | ICD-10-CM | POA: Diagnosis not present

## 2022-03-27 DIAGNOSIS — R6882 Decreased libido: Secondary | ICD-10-CM | POA: Diagnosis not present

## 2022-03-27 DIAGNOSIS — Z8542 Personal history of malignant neoplasm of other parts of uterus: Secondary | ICD-10-CM | POA: Diagnosis present

## 2022-03-27 DIAGNOSIS — Z7982 Long term (current) use of aspirin: Secondary | ICD-10-CM | POA: Insufficient documentation

## 2022-03-27 DIAGNOSIS — F419 Anxiety disorder, unspecified: Secondary | ICD-10-CM | POA: Diagnosis not present

## 2022-03-27 DIAGNOSIS — Z90722 Acquired absence of ovaries, bilateral: Secondary | ICD-10-CM | POA: Insufficient documentation

## 2022-03-27 DIAGNOSIS — N941 Unspecified dyspareunia: Secondary | ICD-10-CM | POA: Diagnosis not present

## 2022-03-27 DIAGNOSIS — Z90710 Acquired absence of both cervix and uterus: Secondary | ICD-10-CM | POA: Insufficient documentation

## 2022-03-27 DIAGNOSIS — C541 Malignant neoplasm of endometrium: Secondary | ICD-10-CM

## 2022-03-27 DIAGNOSIS — Z79899 Other long term (current) drug therapy: Secondary | ICD-10-CM | POA: Insufficient documentation

## 2022-03-27 DIAGNOSIS — N393 Stress incontinence (female) (male): Secondary | ICD-10-CM | POA: Diagnosis not present

## 2022-03-27 NOTE — Patient Instructions (Signed)
No concerning findings on today's exam. Plan to follow up in six months or sooner if needed. Please call the office for any new, persistent symptoms or for any questions.  Symptoms to report to your health care team include vaginal bleeding, rectal bleeding, bloating, weight loss without effort, new and persistent pain, new and  persistent fatigue, new leg swelling, new masses (i.e., bumps in your neck or groin), new and persistent cough, new and persistent nausea and vomiting, change in bowel or bladder habits, and any other concerns.   Options for lubrication include coconut oil, olive oil.   Good luck with the weight loss efforts and family transitions.

## 2022-03-27 NOTE — Progress Notes (Signed)
Gynecologic Oncology Follow-up Note  Chief Complaint:  Chief Complaint  Patient presents with   Endometrial Cancer    Assessment/Plan: Sherry Strong is a 61 y.o. 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 Pathology revealed low risk factors for recurrence, therefore no adjuvant therapy is recommended according to NCCN guidelines.  No evidence of recurrence on today's examination. She is advised to continue to have follow-up every 6 months for 5 years in accordance with NCCN guidelines. Those visits should include symptom assessment, physical exam and pelvic examination. Pap smears are not indicated or recommended in the routine surveillance of endometrial cancer. Reportable signs and symptoms reviewed. She is advised to call for any needs or new symptoms. Samples for vaginal moisturizers and lubrication given.  HPI: [name] is a 61 year old P1 who was initially seen in consultation at the request of Dr Ilda Basset for evaluation of Grade 2 endometrioid adenocarcinoma of the endometrium.  Starting in December 2020, she began having symptoms of pink discharge and light vaginal bleeding that was postmenopausal in nature.  She knew she had a primary care appointment scheduled with her primary care physician, Eldridge Abrahams, NP, in February 2021 and therefore waited until this visit to present with symptoms.  At that time, she informed Ms. Ronnald Ramp that she was having bleeding and a pap smear was done.  That Pap smear was normal.  The bleeding symptoms persisted and therefore the patient called back to discuss this in April 2021 with Eldridge Abrahams NP who promptly referred her to an OB/GYN doctor, Dr. Ilda Basset. Dr. Ilda Basset saw the patient on Feb 12, 2020 and after learning of symptoms of postmenopausal bleeding immediately performed an endometrial biopsy at that visit.  This revealed endometrioid carcinoma, approaching FIGO grade 2.  On  03/08/20, she underwent robotic assisted total hysterectomy, BSO, SLN mapping and right pelvic lymphadenectomy. Intraoperative findings were significant for a 6 cm uterus, extreme abdominal adiposity, normal tubes and ovaries, no gross extrauterine disease, unilateral lateral sentinel lymph node mapping to the left. Surgery was uncomplicated.   Final pathology revealed FIGO grade 1 endometrioid endometrial adenocarcinoma focally invading the myometrium for depth of 0.3 cm with a myometrial thickness of 2 cm.  No LVSI.  Negative cervix, negative lymph nodes.  MMR normal, MSI stable. This pathology represented low risk features for recurrence and therefore no adjuvant therapy was recommended in accordance with NCCN guidelines. Post-operatively, she experienced left obturator nerve weakness and right genitofemoral nerve dysfunction with the obturator nerve weakness resolving spontaneously and the genitofemoral nerve dysfunction remaining stable.  Interval Hx:  Patient is constantly on the go and has a busy lifestyle. She is a caregiver for an older adult and they are active throughout the week including walking and yoga. She has been going to a weight loss clinic and feels she has plateaued around 200 lbs. She usually eats one meal a day and does not have a strong appetite. She has been given dietary choices to incorporate from the weight loss clinic. She is focused on improving her liver health and fatty liver noted on imaging. She did state her liver enzymes are improving on recent lab work. She reports having diarrhea since January. She was seen by GI and was started on questran but was unable to tolerate this. She denies abdominal pain, bloating, early satiety. She had a colonoscopy 1-2 years ago and had 2 precancerous polyps removed with the recommendation to follow  up in 5 years.   She has seen a dermatologist in the past for skin changes under her right eye and with her eyebrows and plans to follow up  since the issue has not resolved. She is up to date with mammograms.  She has insomnia and takes xanax and trazadone at bedtime. She reports painful intercourse with lubrication. She has a decreased libido due to pain.   She has been transitioning her mother to assisted living over the past year and is currently working on selling her home, arranging estate Public relations account executive. She plans to follow up with her ortho MD due to anterior right foot discomfort. She had a recent steroid injection in her left knee. No vaginal bleeding. Mild stress incontinence but she relates this to drinking over a gallon a day of water. Denies hematuria, blood in stool. No cough, chest pain, dyspnea.  She feels extremely stressed and feels this contributes to challenges losing weight. No symptoms concerning for recurrence voiced.  Current Meds:  Outpatient Encounter Medications as of 03/27/2022  Medication Sig   ALPRAZolam (XANAX) 1 MG tablet Take 1 mg by mouth 3 (three) times daily as needed for anxiety.    aspirin 81 MG EC tablet Take 1 tablet by mouth daily.   brexpiprazole (REXULTI) 1 MG TABS tablet Take 1 mg by mouth daily.   Cholecalciferol (VITAMIN D) 125 MCG (5000 UT) CAPS Take 5,000 Units by mouth daily.   Cyanocobalamin (B-12) 5000 MCG CAPS Take 5,000 mcg by mouth daily.   Elderberry 500 MG CAPS Take 1 capsule by mouth daily.   fenofibrate (TRICOR) 145 MG tablet Take 145 mg by mouth at bedtime.    lamoTRIgine (LAMICTAL) 200 MG tablet Take 200 mg by mouth at bedtime.    Multiple Vitamins-Minerals (MULTIVITAMIN ADULT, MINERALS, PO) 1 tablet   pantoprazole (PROTONIX) 40 MG tablet Take 40 mg by mouth at bedtime.    pravastatin (PRAVACHOL) 80 MG tablet Take 80 mg by mouth daily.   Probiotic TBEC See admin instructions.   Quercetin 50 MG TABS 1 tablet   traZODone (DESYREL) 150 MG tablet Take 150 mg by mouth at bedtime.   zinc gluconate 50 MG tablet Take 50 mg by mouth daily.   [DISCONTINUED] REXULTI 2 MG TABS tablet Take 2  mg by mouth daily.   metoprolol succinate (TOPROL-XL) 25 MG 24 hr tablet Take 25 mg by mouth at bedtime.    No facility-administered encounter medications on file as of 03/27/2022.    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  Tobacco Use   Smoking status: Never   Smokeless tobacco: Never  Vaping Use   Vaping Use: Never used  Substance and Sexual Activity   Alcohol use: Yes    Alcohol/week: 5.0 - 6.0 standard drinks of alcohol    Types: 5 - 6 Cans of beer per week    Comment: occas    Drug use: No   Sexual activity: Not Currently    Birth control/protection: None  Other Topics Concern   Not on file  Social History Narrative   Not on file   Social Determinants of Health   Financial Resource Strain: Low Risk  (08/01/2017)   Overall Financial Resource Strain (CARDIA)    Difficulty of Paying Living Expenses: Not hard at all  Food Insecurity: No Food Insecurity (08/01/2017)   Hunger Vital Sign    Worried About Running Out of Food in the Last Year: Never true    Ran Out of Food in the Last Year: Never true  Transportation Needs: No Transportation Needs (08/01/2017)   PRAPARE - Hydrologist (Medical): No    Lack of Transportation (Non-Medical): No  Physical Activity: Inactive (08/01/2017)   Exercise Vital Sign    Days of Exercise per Week: 0 days    Minutes of Exercise per Session: 0 min  Stress: Stress Concern Present (08/01/2017)   [place]    Feeling of Stress : Very much   Social Connections: Moderately Isolated (08/01/2017)   Social Connection and Isolation Panel [NHANES]    Frequency of Communication with Friends and Family: More than three times a week    Frequency of Social Gatherings with Friends and Family: More than three times a week    Attends Religious Services: Never    Marine scientist or Organizations: No    Attends Archivist Meetings: Never    Marital Status: Divorced  Human resources officer Violence: Not At Risk (08/01/2017)   Humiliation, Afraid, Rape, and Kick questionnaire    Fear of Current or Ex-Partner: No    Emotionally Abused: No    Physically Abused: No    Sexually Abused: No    Past Surgical Hx:  Past Surgical History:  Procedure Laterality Date   CESAREAN SECTION     CHOLECYSTECTOMY N/A 10/05/2017   ENDOMETRIAL BIOPSY  02/12/2020       LAPAROSCOPY     LYMPHADENECTOMY Right 03/08/2020   Procedure: PELVIC LYMPHADENECTOMY;  Surgeon: Everitt Amber, MD;  Location: [place];  Service: Gynecology;  Laterality: Right;   ROBOTIC ASSISTED TOTAL HYSTERECTOMY WITH BILATERAL SALPINGO OOPHERECTOMY Bilateral 03/08/2020   Procedure: XI ROBOTIC ASSISTED TOTAL HYSTERECTOMY WITH BILATERAL SALPINGO OOPHORECTOMY;  Surgeon: Everitt Amber, MD;  Location: [place];  Service: Gynecology;  Laterality: Bilateral;   SENTINEL NODE BIOPSY N/A 03/08/2020   Procedure: SENTINEL NODE BIOPSY;  Surgeon: Everitt Amber, MD;  Location: [facility];  Service: Gynecology;  Laterality: N/A;    Past Medical Hx:  Past Medical History:  Diagnosis Date   Anxiety    Bipolar disorder ([place])    Cancer ([place]) 03/08/2020   Depression    GERD (gastroesophageal reflux disease)    Heart murmur    echo- 08/30/15-epic    History of hiatal hernia    HLD (hyperlipidemia)    HTN (hypertension)    Hx of colonoscopy 04/2020   Infertility, female    Menopause    PONV (postoperative nausea and vomiting)     Past  Gynecological History:  See HPI Patient's last menstrual period was 01/25/2011.  Family Hx:  Family History  Problem Relation Age of Onset   Heart disease Mother    Depression Mother    Cancer Mother        precancerous cells on cervix   Hyperlipidemia Father    Hypertension Father    Heart disease Father    Cancer Father  Depression Father    Anxiety disorder Father    Sleep apnea Father     Review of Systems: Constitutional: Feels stressed. Attempting weight loss efforts. No fever, chills, early satiety.  ENT: Normal appearing ears and nares bilaterally Skin/Breast: No new rashes, sores, jaundice, itching, dryness. Has seen dermatologist in the past for area on the skin under the right eye and for eyebrow since dryness/flakiness. Cardiovascular: No chest pain, shortness of breath, or edema  Pulmonary: No new cough or wheeze.  Gastrointestinal: No nausea, vomiting. No bright red blood per rectum, no abdominal pain, change in bowel movement, or constipation. Has had diarrhea since earlier this year- was prescribed questran but unable to tolerate  Genitourinary: No new frequency, urgency, dysuria,  Musculoskeletal: Recent steroid injection in left knee with osteoarthritis noted on imaging. New discomfort on the anterior aspect of the right foot-no erythema or known recent injury in this area.  Psychology: Currently on 2 medications for depression. Has insomnia- uses xanax and trazodone at bedtime   Health Maintenance: Mammogram: Up to date Colonoscopy: 1-2 years ago with 2 precancerous polyps removed with recommendation for repeat in 5 years  Vitals:  Blood pressure 129/77, pulse 93, temperature 97.8 F (36.6 C), temperature source Tympanic, resp. rate 18, height 5' 3"  (1.6 m), weight 209 lb (94.8 kg), last menstrual period 01/25/2011, SpO2 98 %. General: Well developed, well nourished female in no acute distress. Alert and oriented x 3.  Neck: Supple without any enlargements.   Lymph node survey: No cervical, supraclavicular, or inguinal adenopathy.  Cardiovascular: Regular rate and rhythm. S1 and S2 normal.  Lungs: Clear to auscultation bilaterally. No wheezes/crackles/rhonchi noted.  Skin: No new rashes or lesions present. Skin dryness noted under right eye. Back: No CVA tenderness.  Abdomen: Abdomen soft, non-tender and obese. Active bowel sounds in all quadrants. No evidence of a fluid wave or abdominal masses. Incisions well healed without nodularity. Genitourinary:    Vulva/vagina: Normal external female genitalia. No lesions.    Urethra: No lesions or masses.    Vagina: Atrophic without any lesions. No palpable masses. No vaginal bleeding or drainage noted. Well healed, smooth vaginal cuff free of lesions. Rectal: Good tone, no masses, no cul de sac nodularity.  Extremities: No bilateral cyanosis, edema, or clubbing.    Dorothyann Gibbs, NP  03/29/2022, 4:42 PM

## 2022-06-12 ENCOUNTER — Ambulatory Visit: Payer: Medicare Other | Admitting: Neurology

## 2022-07-31 ENCOUNTER — Ambulatory Visit: Payer: Medicare Other | Attending: Cardiology | Admitting: Cardiology

## 2022-07-31 ENCOUNTER — Encounter: Payer: Self-pay | Admitting: Cardiology

## 2022-07-31 VITALS — BP 124/72 | HR 61 | Ht 63.0 in | Wt 191.0 lb

## 2022-07-31 DIAGNOSIS — Z8249 Family history of ischemic heart disease and other diseases of the circulatory system: Secondary | ICD-10-CM

## 2022-07-31 DIAGNOSIS — I10 Essential (primary) hypertension: Secondary | ICD-10-CM | POA: Diagnosis not present

## 2022-07-31 DIAGNOSIS — R002 Palpitations: Secondary | ICD-10-CM

## 2022-07-31 NOTE — Addendum Note (Signed)
Addended by: Vergia Alcon A on: 07/31/2022 08:38 AM   Modules accepted: Orders

## 2022-07-31 NOTE — Progress Notes (Signed)
Cardiology CONSULT Note    Date:  07/31/2022   ID:  Sherry Strong, DOB 07-21-1961, MRN [id number]  PCP:  Marda Stalker, PA-C  Cardiologist:  Fransico Him, MD   Chief Complaint  Patient presents with   New Patient (Initial Visit)    Palpitations    History of Present Illness:  Sherry Strong is a 61 y.o. female who is being seen today for the evaluation of palpitations at the request of Lurline Del, DO.  This is a 61 year old female with a history of bipolar disorder, depression, GERD, hypertension, hyperlipidemia who is referred for evaluation of palpitations.  SHe tells me that a few months ago she went to a weight loss program and they put her on a weight loss drug that she initially tolerated but after a few months her heart started to race.  She was also drinking 2 ice coffees daily.  She stopped the weight loss drug and her palpitations completely stopped even with continuing the iced coffees.  She is now in another weight loss program and has lost 20lbs since Sept and has not problems on the new weight loss program. She does have GERD and occasionally will have a pain in the center of her chest that resolves with belching immediately.  She has had some DOE which has significantly improved with the weight loss.  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[place])    Anxiety    Bipolar disorder ([place])    Bipolar disorder ([place])    Cancer ([place]) 03/08/2020   Depression    Elevated blood sugar    GERD (gastroesophageal reflux disease)    Heart murmur    echo- 08/30/15-epic    History of hiatal hernia    HLD (hyperlipidemia)    HTN (hypertension)    Hx of colonoscopy 04/2020   Infertility, female    Menopause    Obesity    Other chronic pain    Palpitations    PONV (postoperative nausea and vomiting)    Prediabetes    Vitamin D deficiency     Past Surgical History:  Procedure Laterality Date   CESAREAN SECTION     CHOLECYSTECTOMY N/A  10/05/2017   ENDOMETRIAL BIOPSY  02/12/2020       LAPAROSCOPY     LYMPHADENECTOMY Right 03/08/2020   Procedure: PELVIC LYMPHADENECTOMY;  Surgeon: Everitt Amber, MD;  Location: [place];  Service: Gynecology;  Laterality: Right;   ROBOTIC ASSISTED TOTAL HYSTERECTOMY WITH BILATERAL SALPINGO OOPHERECTOMY Bilateral 03/08/2020   Procedure: XI ROBOTIC ASSISTED TOTAL HYSTERECTOMY WITH BILATERAL SALPINGO OOPHORECTOMY;  Surgeon: Everitt Amber, MD;  Location: [place];  Service: Gynecology;  Laterality: Bilateral;   SENTINEL NODE BIOPSY N/A 03/08/2020   Procedure: SENTINEL NODE BIOPSY;  Surgeon: Everitt Amber, MD;  Location: [facility];  Service: Gynecology;  Laterality: N/A;    Current Medications: Current Meds  Medication Sig   ALPRAZolam (XANAX) 1 MG tablet Take 1 mg by mouth 3 (three) times daily as needed for anxiety.    aspirin 81 MG EC tablet Take 1 tablet by mouth daily.   brexpiprazole (REXULTI) 1 MG TABS tablet Take 1 mg by mouth daily.   Cholecalciferol (VITAMIN D) 125 MCG (5000 UT) CAPS Take 5,000 Units by mouth daily.   Cyanocobalamin (B-12) 5000 MCG CAPS Take 5,000 mcg by mouth daily.   Elderberry 500 MG CAPS Take 1 capsule by mouth daily.   fenofibrate (TRICOR) 145  MG tablet Take 145 mg by mouth at bedtime.    lamoTRIgine (LAMICTAL) 200 MG tablet Take 200 mg by mouth at bedtime.    metoprolol succinate (TOPROL-XL) 25 MG 24 hr tablet Take 25 mg by mouth at bedtime.    Multiple Vitamins-Minerals (MULTIVITAMIN ADULT, MINERALS, PO) 1 tablet   pantoprazole (PROTONIX) 40 MG tablet Take 40 mg by mouth at bedtime.    pravastatin (PRAVACHOL) 80 MG tablet Take 80 mg by mouth daily.   Probiotic TBEC See admin instructions.   Quercetin 50 MG TABS 1 tablet   traZODone (DESYREL) 150 MG tablet Take 150 mg by mouth at bedtime.   zinc gluconate 50 MG tablet Take 50 mg by mouth daily.    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  Tobacco Use   Smoking status: Never   Smokeless tobacco: Never  Vaping Use   Vaping Use: Never used  Substance and Sexual Activity   Alcohol use: Yes    Alcohol/week: 5.0 - 6.0 standard drinks of alcohol    Types: 5 - 6 Cans of beer per week    Comment: occas    Drug use: No   Sexual activity: Not Currently    Birth control/protection: None  Other Topics Concern   Not on file  Social History Narrative   Not on file   Social Determinants of Health   Financial Resource Strain: Low Risk  (08/01/2017)   Overall Financial Resource Strain (CARDIA)    Difficulty of Paying Living Expenses: Not hard at all  Food Insecurity: No Food Insecurity (08/01/2017)   Hunger Vital Sign    Worried About Running Out of Food in the Last Year: Never true    Ran Out of Food in the Last Year: Never true  Transportation Needs: No Transportation Needs (08/01/2017)   PRAPARE - Hydrologist (Medical): No    Lack of Transportation (Non-Medical): No  Physical Activity: Inactive (08/01/2017)   Exercise Vital Sign    Days of Exercise per Week: 0 days    Minutes of Exercise per Session: 0 min  Stress: Stress Concern Present (08/01/2017)   [place]    Feeling of Stress : Very much  Social Connections: Moderately Isolated (08/01/2017)   Social Connection and Isolation Panel [NHANES]    Frequency of Communication with Friends and Family: More than three times a week    Frequency of Social Gatherings with Friends and Family: More than three times a week    Attends Religious Services: Never    Marine scientist or Organizations: No    Attends  Music therapist: Never    Marital Status: Divorced     Family History:  The patient's family history includes Anxiety disorder in her father; Cancer in her father and mother; Depression in her father and mother; Heart attack in her father and mother; Heart disease in her father and mother; Hyperlipidemia in her father; Hypertension in her father; Sleep apnea in her father.   ROS:   Please see the history of present illness.    ROS All other  systems reviewed and are negative.      No data to display             PHYSICAL EXAM:   VS:  BP 124/72   Pulse 61   Ht '5\' 3"'$  (1.6 m)   Wt 191 lb (86.6 kg)   LMP 01/25/2011   SpO2 97%   BMI 33.83 kg/m    GEN: Well nourished, well developed, in no acute distress  HEENT: normal  Neck: no JVD, carotid bruits, or masses Cardiac: RRR; no murmurs, rubs, or gallops,no edema.  Intact distal pulses bilaterally.  Respiratory:  clear to auscultation bilaterally, normal work of breathing GI: soft, nontender, nondistended, + BS MS: no deformity or atrophy  Skin: warm and dry, no rash Neuro:  Alert and Oriented x 3, Strength and sensation are intact Psych: euthymic mood, full affect  Wt Readings from Last 3 Encounters:  07/31/22 191 lb (86.6 kg)  03/27/22 209 lb (94.8 kg)  10/06/21 222 lb (100.7 kg)      Studies/Labs Reviewed:   EKG:  EKG is ordered today.  The ekg ordered today demonstrates NSR  Recent Labs: No results found for requested labs within last 365 days.   Lipid Panel    Component Value Date/Time   CHOL 220 (H) 05/08/2018 1246   TRIG 131 05/08/2018 1246   HDL 51 05/08/2018 1246   LDLCALC 143 (H) 05/08/2018 1246     CHA2DS2-VASc Score =     This indicates a  % annual risk of stroke. The patient's score is based upon:             Additional studies/ records that were reviewed today include:  Office visit notes from PCP    ASSESSMENT:    1. Palpitations   2. Essential hypertension   3.  Family history of premature CAD      PLAN:  In order of problems listed above:  Palpitations -likely related to her prior weight loss drug and her palpitations have completely resolved off the weight loss drug -I told her to let me know if her palpitations reoccur  2.  Hypertension -BP controlled on exam -Continue prescription drug management with Toprol-XL 25 mg daily with as needed refills  3.  Family hx of premature CAD -her mom had several MIs and first in her 79's -I will get a coronary Ca score to asses future risk  Time Spent: 20 minutes total time of encounter, including 15 minutes spent in face-to-face patient care on the date of this encounter. This time includes coordination of care and counseling regarding above mentioned problem list. Remainder of non-face-to-face time involved reviewing chart documents/testing relevant to the patient encounter and documentation in the medical record. I have independently reviewed documentation from referring provider  Medication Adjustments/Labs and Tests Ordered: Current medicines are reviewed at length with the patient today.  Concerns regarding medicines are outlined above.  Medication changes, Labs and Tests ordered today are listed in the Patient Instructions below.  There are no Patient Instructions on file for this visit.   Signed, Fransico Him, MD  07/31/2022 8:24 AM    [address], [place], [place]  [address] Phone: [phone]; Fax: [phone]

## 2022-07-31 NOTE — Patient Instructions (Signed)
Medication Instructions:  Your physician recommends that you continue on your current medications as directed. Please refer to the Current Medication list given to you today.  *If you need a refill on your cardiac medications before your next appointment, please call your pharmacy*   Testing/Procdure [place] Calcium score   Follow-Up: At [facility], you and your health needs are our priority.  As part of our continuing mission to provide you with exceptional heart care, we have created designated Provider Care Teams.  These Care Teams include your primary Cardiologist (physician) and Advanced Practice Providers (APPs -  Physician Assistants and Nurse Practitioners) who all work together to provide you with the care you need, when you need it.  We recommend signing up for the patient portal called "MyChart".  Sign up information is provided on this After Visit Summary.  MyChart is used to connect with patients for Virtual Visits (Telemedicine).  Patients are able to view lab/test results, encounter notes, upcoming appointments, etc.  Non-urgent messages can be sent to your provider as well.   To learn more about what you can do with MyChart, go to [email or web address].    Your next appointment:   As needed  The format for your next appointment:   In Person  Provider:   Fransico Him, MD     Important Information About Sugar

## 2022-10-02 ENCOUNTER — Ambulatory Visit (HOSPITAL_BASED_OUTPATIENT_CLINIC_OR_DEPARTMENT_OTHER)
Admission: RE | Admit: 2022-10-02 | Discharge: 2022-10-02 | Disposition: A | Discharge status: Home / Self Care | Payer: Medicare Other | Source: Ambulatory Visit | Attending: Cardiology | Admitting: Cardiology

## 2022-10-02 DIAGNOSIS — Z8249 Family history of ischemic heart disease and other diseases of the circulatory system: Secondary | ICD-10-CM | POA: Insufficient documentation

## 2022-10-02 DIAGNOSIS — I10 Essential (primary) hypertension: Secondary | ICD-10-CM | POA: Insufficient documentation

## 2022-10-02 DIAGNOSIS — R002 Palpitations: Secondary | ICD-10-CM | POA: Insufficient documentation

## 2022-10-04 ENCOUNTER — Telehealth: Payer: Self-pay

## 2022-10-04 NOTE — Telephone Encounter (Signed)
-----   Message from Nuala Alpha, LPN sent at 05/09/9469  8:18 AM EST -----  ----- Message ----- From: Sueanne Margarita, MD Sent: 10/03/2022   1:28 PM EST To: [facility] Triage  Good news no coronary Ca

## 2022-10-04 NOTE — Telephone Encounter (Signed)
LMTCB 10/04/22.

## 2022-10-06 ENCOUNTER — Encounter: Payer: Self-pay | Admitting: Gynecologic Oncology

## 2022-10-09 ENCOUNTER — Inpatient Hospital Stay: Payer: Medicare Other | Attending: Gynecologic Oncology | Admitting: Gynecologic Oncology

## 2022-10-09 ENCOUNTER — Inpatient Hospital Stay: Payer: Medicare Other | Admitting: Licensed Clinical Social Worker

## 2022-10-09 ENCOUNTER — Other Ambulatory Visit: Payer: Self-pay

## 2022-10-09 VITALS — BP 139/93 | HR 79 | Temp 98.2°F | Resp 14 | Wt 176.0 lb

## 2022-10-09 DIAGNOSIS — Z90722 Acquired absence of ovaries, bilateral: Secondary | ICD-10-CM | POA: Insufficient documentation

## 2022-10-09 DIAGNOSIS — F419 Anxiety disorder, unspecified: Secondary | ICD-10-CM | POA: Diagnosis not present

## 2022-10-09 DIAGNOSIS — Z90710 Acquired absence of both cervix and uterus: Secondary | ICD-10-CM | POA: Insufficient documentation

## 2022-10-09 DIAGNOSIS — Z79899 Other long term (current) drug therapy: Secondary | ICD-10-CM | POA: Diagnosis not present

## 2022-10-09 DIAGNOSIS — Z7982 Long term (current) use of aspirin: Secondary | ICD-10-CM | POA: Diagnosis not present

## 2022-10-09 DIAGNOSIS — Z8542 Personal history of malignant neoplasm of other parts of uterus: Secondary | ICD-10-CM | POA: Diagnosis present

## 2022-10-09 DIAGNOSIS — C541 Malignant neoplasm of endometrium: Secondary | ICD-10-CM
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
## 2022-10-09 NOTE — Patient Instructions (Addendum)
No concerning findings on today's exam. Plan to follow up in six months or sooner if needed. Please call the office for any new, persistent symptoms or for any questions.   Symptoms to report to your health care team include vaginal bleeding, rectal bleeding, bloating, weight loss without effort, new and persistent pain, new and  persistent fatigue, new leg swelling, new masses (i.e., bumps in your neck or groin), new and persistent cough, new and persistent nausea and vomiting, change in bowel or bladder habits, and any other concerns.   I wish you the best during these challenging times. Please let us know if we can be of assistance.   Congrats on your weight loss and continued journey.
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
## 2022-10-10 ENCOUNTER — Ambulatory Visit: Payer: Medicare Other | Admitting: Gynecologic Oncology

## 2022-12-02 IMAGING — CR DG KNEE COMPLETE 4+V*L*
4 series · 4 of 4 positions shown · non-contrast
Comparison: None.

CLINICAL DATA: Left knee pain and swelling.

EXAM:
LEFT KNEE - COMPLETE 4+ VIEW

[w knee ap left (1 of 2)]
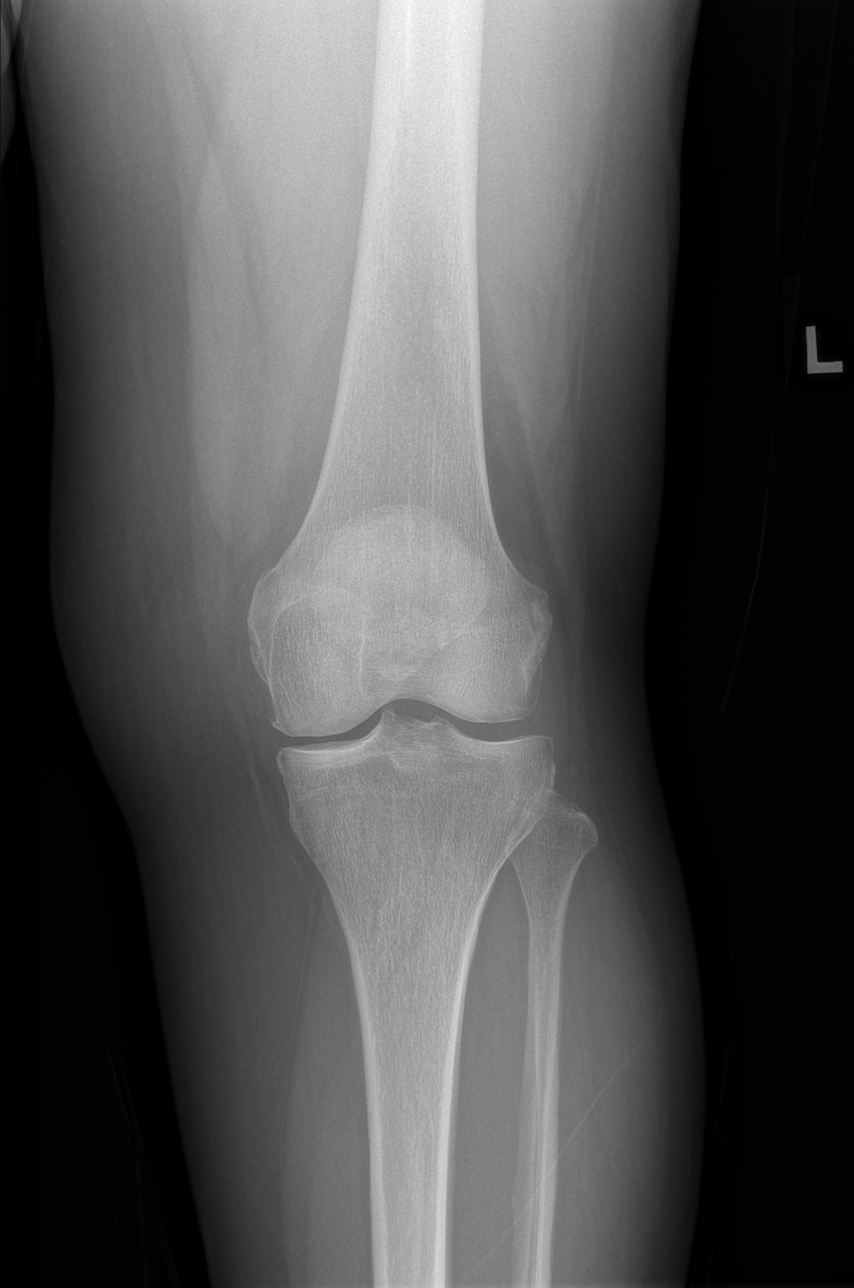

[w knee lat. left]
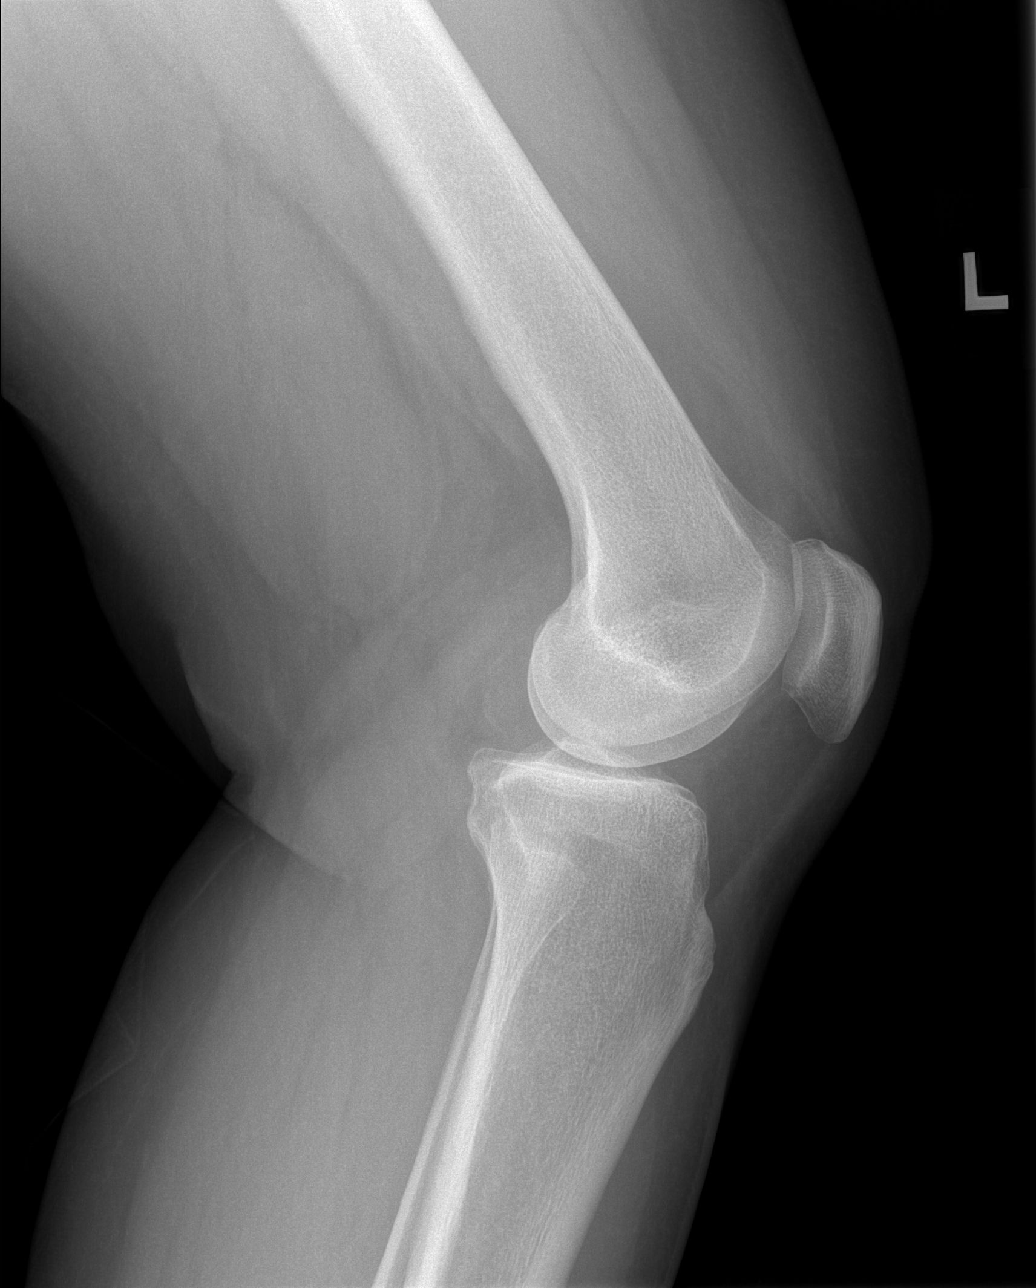

[w knee ap left (2 of 2)]
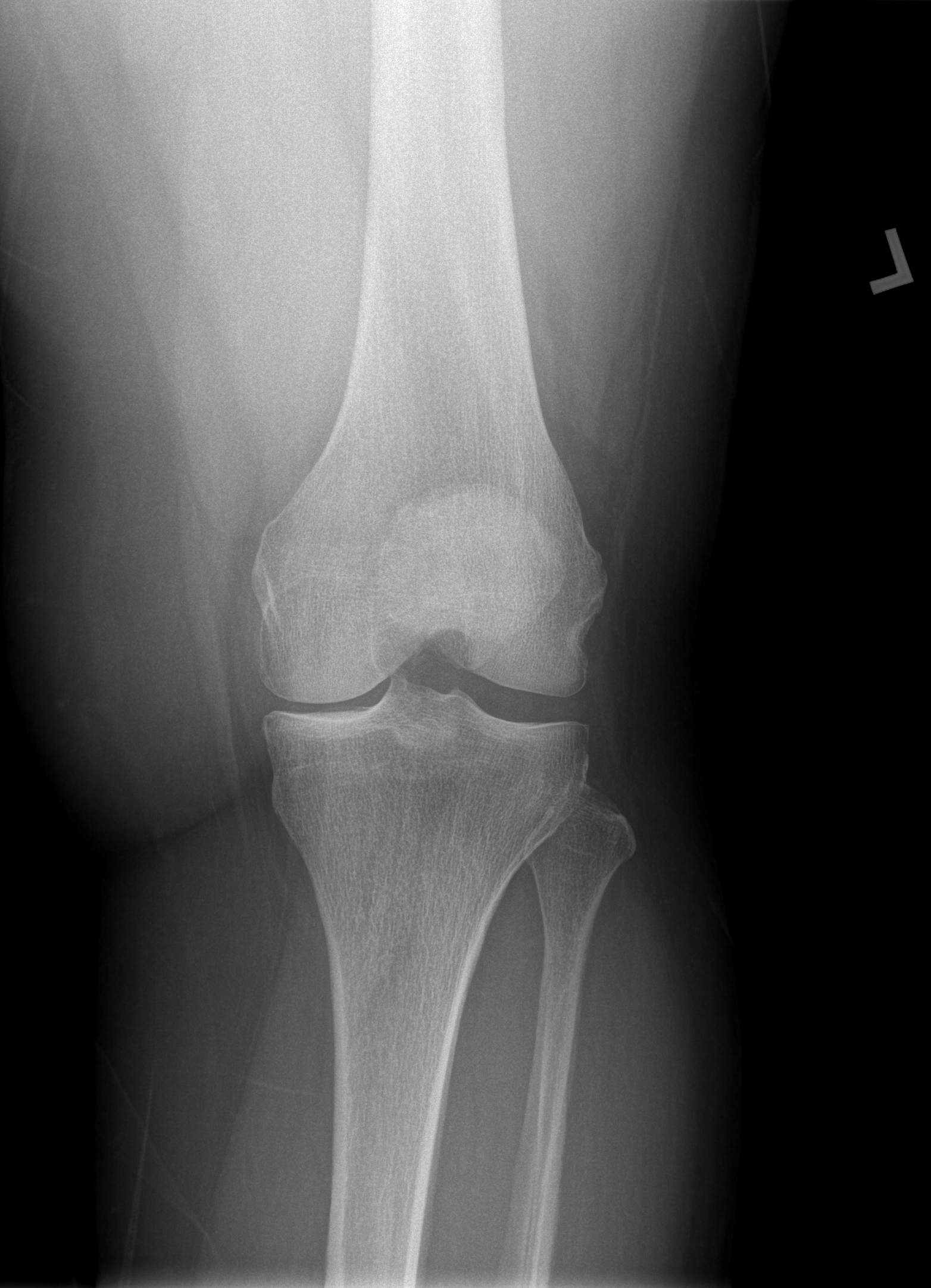

[w knee ap left *]
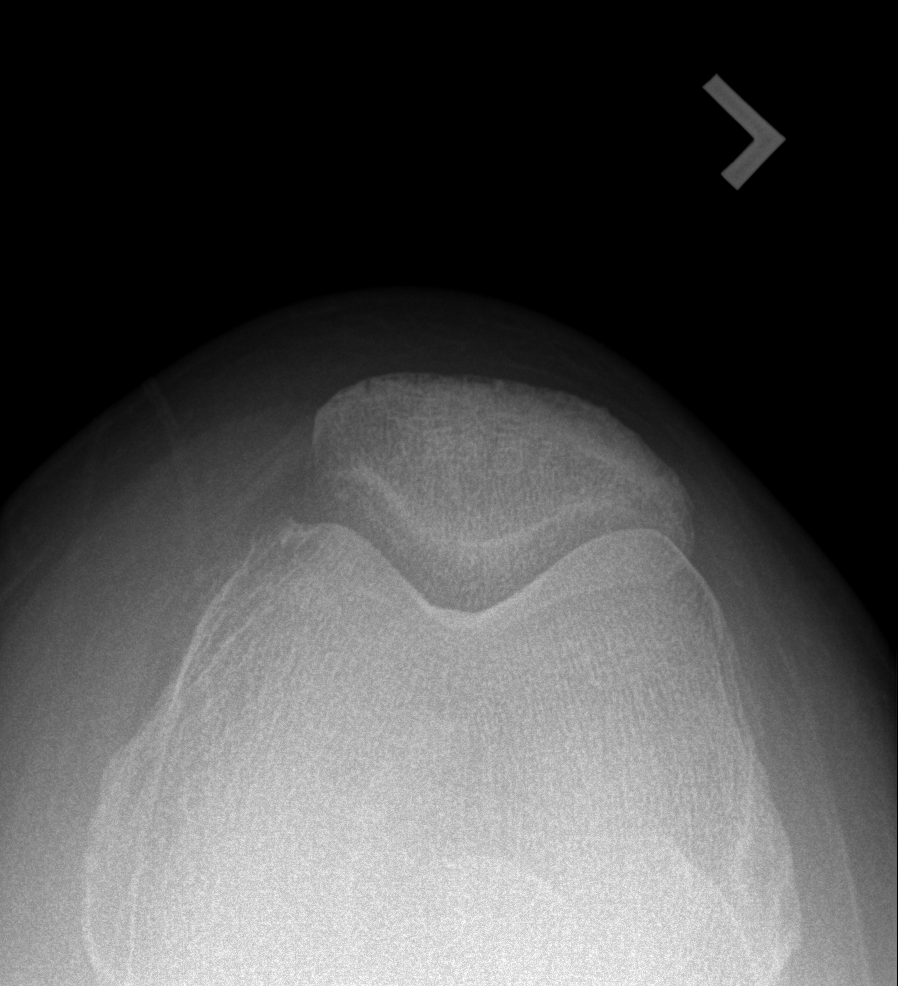

[4 of 4 positions shown; findings below may reference images not displayed]

FINDINGS: Mild medial compartment joint space narrowing with minimal
peripheral degenerative spurring. No joint effusion. No acute
fracture or dislocation. Mild peripheral medial trochlear
degenerative spurring.
IMPRESSION: Mild medial compartment osteoarthritis.

## 2023-04-10 ENCOUNTER — Other Ambulatory Visit: Payer: Self-pay

## 2023-04-10 ENCOUNTER — Inpatient Hospital Stay: Payer: Medicare Other | Attending: Gynecologic Oncology | Admitting: Gynecologic Oncology

## 2023-04-10 VITALS — BP 132/84 | HR 73 | Temp 97.9°F | Resp 18 | Ht 63.0 in | Wt 180.0 lb

## 2023-04-10 DIAGNOSIS — R630 Anorexia: Secondary | ICD-10-CM | POA: Insufficient documentation

## 2023-04-10 DIAGNOSIS — K5900 Constipation, unspecified: Secondary | ICD-10-CM | POA: Diagnosis not present

## 2023-04-10 DIAGNOSIS — C541 Malignant neoplasm of endometrium: Secondary | ICD-10-CM

## 2023-04-10 DIAGNOSIS — Z90710 Acquired absence of both cervix and uterus: Secondary | ICD-10-CM | POA: Diagnosis not present

## 2023-04-10 DIAGNOSIS — Z8542 Personal history of malignant neoplasm of other parts of uterus: Secondary | ICD-10-CM | POA: Diagnosis present

## 2023-04-10 DIAGNOSIS — R110 Nausea: Secondary | ICD-10-CM | POA: Diagnosis not present

## 2023-04-10 DIAGNOSIS — Z90722 Acquired absence of ovaries, bilateral: Secondary | ICD-10-CM | POA: Diagnosis not present

## 2023-04-10 NOTE — Patient Instructions (Addendum)
It was great seeing you today. Good news! No concerning findings on today's exam. Plan to follow up in six months or sooner if needed. Please call the office for any new, persistent symptoms or for any questions.   Symptoms to report to your health care team include vaginal bleeding, rectal bleeding, bloating, weight loss without effort, new and persistent pain, new and  persistent fatigue, new leg swelling, new masses (i.e., bumps in your neck or groin), new and persistent cough, new and persistent nausea and vomiting, change in bowel or bladder habits, and any other concerns.   Good luck with your continued weight loss efforts. You can try adding miralax daily to your routine to assist with constipation. This is a powder that can be mixed in any liquid. You can always start with every other day as well to see how your body does with this.
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
## 2023-06-07 ENCOUNTER — Other Ambulatory Visit: Payer: Self-pay | Admitting: Gastroenterology

## 2023-06-07 DIAGNOSIS — R7989 Other specified abnormal findings of blood chemistry: Secondary | ICD-10-CM

## 2023-07-30 ENCOUNTER — Telehealth: Payer: Self-pay | Admitting: *Deleted

## 2023-07-30 NOTE — Telephone Encounter (Signed)
Spoke with patient who called the office stating yesterday she noticed some bright red blood on toilet tissue when she wiped and very minimal this morning and since then it has stopped. Pt didn't need to wear a pad or panty liner. Denies all urinary symptoms, no fever or chills, pt doesn't take any hormonal medications.  Pt is currently not sexually active. Pt denies doing any activity prior to the bleeding. Advised patient to monitor the vaginal bleeding/spotting and message would be relayed to provider.

## 2023-07-31 NOTE — Telephone Encounter (Signed)
Spoke with patient in regards to vaginal bleeding. Pt states she hasn't had any today, just slight cramping. Pt would feel more comfortable coming in to see provider for reassurance. Pt was given an appointment for Wednesday, November 6 th at 1130 with Warner Mccreedy, NP. Pt agreed to date and time and thanked the office for calling.

## 2023-08-01 ENCOUNTER — Encounter: Payer: Self-pay | Admitting: *Deleted

## 2023-08-01 ENCOUNTER — Inpatient Hospital Stay: Payer: Medicare Other | Attending: Gynecologic Oncology | Admitting: Gynecologic Oncology

## 2023-08-01 VITALS — BP 131/72 | HR 70 | Temp 98.1°F | Resp 16 | Ht 63.0 in | Wt 195.2 lb

## 2023-08-01 DIAGNOSIS — Z90722 Acquired absence of ovaries, bilateral: Secondary | ICD-10-CM | POA: Diagnosis not present

## 2023-08-01 DIAGNOSIS — Z90710 Acquired absence of both cervix and uterus: Secondary | ICD-10-CM | POA: Diagnosis not present

## 2023-08-01 DIAGNOSIS — Z8542 Personal history of malignant neoplasm of other parts of uterus: Secondary | ICD-10-CM | POA: Insufficient documentation

## 2023-08-01 DIAGNOSIS — N939 Abnormal uterine and vaginal bleeding, unspecified: Secondary | ICD-10-CM

## 2023-08-01 DIAGNOSIS — N950 Postmenopausal bleeding: Secondary | ICD-10-CM | POA: Insufficient documentation

## 2023-08-01 DIAGNOSIS — Z9079 Acquired absence of other genital organ(s): Secondary | ICD-10-CM | POA: Diagnosis not present

## 2023-08-01 DIAGNOSIS — C541 Malignant neoplasm of endometrium: Secondary | ICD-10-CM
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
## 2023-08-01 NOTE — Patient Instructions (Addendum)
Today, there was not an obvious site for the bleeding on exam. No concerning findings for cancer return on exam. There are some tiny blood vessels closer to the surface around the urethra where the urine comes out. This can be caused from atrophy due to the lack of estrogen with the tissues. There is no intervention needed for this unless symptoms arise.   If you notice bleeding, please call the office same day so we can try to get you in.  Plan on following up in the office in May 2025 or sooner if needed.  Symptoms to report to your health care team include vaginal bleeding, rectal bleeding, bloating, weight loss without effort, new and persistent pain, new and  persistent fatigue, new leg swelling, new masses (i.e., bumps in your neck or groin), new and persistent cough, new and persistent nausea and vomiting, change in bowel or bladder habits, and any other concerns.

## 2023-10-02 ENCOUNTER — Ambulatory Visit: Payer: Medicare Other | Admitting: Gynecologic Oncology

## 2023-12-31 ENCOUNTER — Ambulatory Visit
Admission: RE | Admit: 2023-12-31 | Discharge: 2023-12-31 | Disposition: A | Discharge status: Home / Self Care | Payer: Medicare Other | Source: Ambulatory Visit | Attending: Gastroenterology

## 2023-12-31 DIAGNOSIS — R7989 Other specified abnormal findings of blood chemistry: Secondary | ICD-10-CM

## 2024-01-18 ENCOUNTER — Emergency Department (HOSPITAL_BASED_OUTPATIENT_CLINIC_OR_DEPARTMENT_OTHER): Admitting: Radiology

## 2024-01-18 ENCOUNTER — Other Ambulatory Visit: Payer: Self-pay

## 2024-01-18 ENCOUNTER — Encounter: Payer: Self-pay | Admitting: *Deleted

## 2024-01-18 ENCOUNTER — Encounter (HOSPITAL_BASED_OUTPATIENT_CLINIC_OR_DEPARTMENT_OTHER): Payer: Self-pay | Admitting: Emergency Medicine

## 2024-01-18 ENCOUNTER — Emergency Department (HOSPITAL_BASED_OUTPATIENT_CLINIC_OR_DEPARTMENT_OTHER)
Admission: EM | Admit: 2024-01-18 | Discharge: 2024-01-18 | Disposition: A | Discharge status: Home / Self Care | Attending: Emergency Medicine | Admitting: Emergency Medicine

## 2024-01-18 DIAGNOSIS — R079 Chest pain, unspecified: Secondary | ICD-10-CM | POA: Diagnosis not present

## 2024-01-18 DIAGNOSIS — R42 Dizziness and giddiness: Secondary | ICD-10-CM | POA: Insufficient documentation

## 2024-01-18 DIAGNOSIS — Z7982 Long term (current) use of aspirin: Secondary | ICD-10-CM | POA: Diagnosis not present

## 2024-01-18 LAB — BASIC METABOLIC PANEL WITH GFR
Anion gap: 14 (ref 5–15)
BUN: 15 mg/dL (ref 8–23)
CO2: 22 mmol/L (ref 22–32)
Calcium: 9.9 mg/dL (ref 8.9–10.3)
Chloride: 104 mmol/L (ref 98–111)
Creatinine, Ser: 0.87 mg/dL (ref 0.44–1.00)
GFR, Estimated: >60 mL/min (ref >60)
Glucose, Bld: 117 mg/dL — ABNORMAL HIGH (ref 70–99)
Potassium: 4.7 mmol/L (ref 3.5–5.1)
Sodium: 140 mmol/L (ref 135–145)

## 2024-01-18 LAB — CBC
HCT: 39.5 % (ref 36.0–46.0)
Hemoglobin: 13.4 g/dL (ref 12.0–15.0)
MCH: 33.7 pg (ref 26.0–34.0)
MCHC: 33.9 g/dL (ref 30.0–36.0)
MCV: 99.2 fL (ref 80.0–100.0)
Platelets: 228 10*3/uL (ref 150–400)
RBC: 3.98 MIL/uL (ref 3.87–5.11)
RDW: 13.1 % (ref 11.5–15.5)
WBC: 6 10*3/uL (ref 4.0–10.5)
nRBC: 0 % (ref 0.0–0.2)

## 2024-01-18 LAB — TROPONIN T, HIGH SENSITIVITY
Troponin T High Sensitivity: <15 ng/L (ref <19)
Troponin T High Sensitivity: <15 ng/L (ref <19)

## 2024-01-18 LAB — D-DIMER, QUANTITATIVE: D-Dimer, Quant: 0.32 ug{FEU}/mL (ref 0.00–0.50)

## 2024-01-18 NOTE — Discharge Instructions (Signed)
 Please follow-up with your primary care clinic early next week for recheck of your symptoms.  Return to the ED if you have new or worsening symptoms.

## 2024-01-18 NOTE — ED Provider Notes (Signed)
 Sherry Strong   CSN: [id number] Arrival date & time: 01/18/24  4401     History  Chief Complaint  Patient presents with   Chest Pain    Sherry Strong is a 63 y.o. female presenting to the apartment with complaint of chest pain and lightheadedness.  Patient reports that she began having pain in the left side of her chest in her shoulder around 630 this morning, lasting about 7 minutes and then resolving.  She has felt lightheaded since then and "woozy headed".  She denies prior history of similar symptoms.  She does report she has high cholesterol, and a family history of cardiac disease.  For this reason she saw cardiologist last year and had a coronary CT, his results I personally reviewed, with a calcium score of 0, felt to be low risk for coronary disease.  She does suffer from stress and anxiety.  She took Xanax  1 mg this morning.  HPI     Home Medications Prior to Admission medications   Medication Sig Start Date End Date Taking? Authorizing Provider  ALPRAZolam  (XANAX ) 1 MG tablet Take 1 mg by mouth 3 (three) times daily as needed for anxiety.  01/21/20   [provider]  aspirin 81 MG EC tablet Take 1 tablet by mouth daily. 07/13/15   [provider]  brexpiprazole (REXULTI) 1 MG TABS tablet Take 1 mg by mouth daily.    [provider]  Cholecalciferol (VITAMIN D ) 125 MCG (5000 UT) CAPS Take 5,000 Units by mouth daily.    [provider]  Cyanocobalamin (B-12) 5000 MCG CAPS Take 5,000 mcg by mouth daily.    [provider]  Elderberry 500 MG CAPS Take 1 capsule by mouth daily.    [provider]  fenofibrate (TRICOR) 145 MG tablet Take 145 mg by mouth at bedtime.  08/15/17   [provider]  lamoTRIgine  (LAMICTAL ) 150 MG tablet Take 300 mg by mouth at bedtime. 08/25/22   [provider]  metoprolol  succinate (TOPROL -XL) 25 MG 24 hr tablet Take 25 mg  by mouth at bedtime.  06/28/16 08/01/23  [provider]  pravastatin (PRAVACHOL) 80 MG tablet Take 80 mg by mouth daily. 01/29/21   [provider]  tiZANidine (ZANAFLEX) 2 MG tablet 1 tablet at bedtime as needed Orally Once a day as needed for 30 days 07/30/23   [provider]  traZODone  (DESYREL ) 150 MG tablet Take 150 mg by mouth at bedtime. 02/06/20   [provider]  zinc gluconate 50 MG tablet Take 50 mg by mouth daily.    [provider]      Allergies    Vistaril   [hydroxyzine  hcl], Meloxicam, Vistaril  [hydroxyzine ], and Tramadol     Review of Systems   Review of Systems  Physical Exam Updated Vital Signs BP 124/70   Pulse (!) 57   Temp 97.8 F (36.6 C) (Oral)   Resp 12   LMP 01/25/2011   SpO2 99%  Physical Exam Constitutional:      General: She is not in acute distress. HENT:     Head: Normocephalic and atraumatic.  Eyes:     Conjunctiva/sclera: Conjunctivae normal.     Pupils: Pupils are equal, round, and reactive to light.  Cardiovascular:     Rate and Rhythm: Normal rate and regular rhythm.  Pulmonary:     Effort: Pulmonary effort is normal. No respiratory distress.  Abdominal:     General:  There is no distension.     Tenderness: There is no abdominal tenderness.  Skin:    General: Skin is warm and dry.  Neurological:     General: No focal deficit present.     Mental Status: She is alert. Mental status is at baseline.  Psychiatric:        Mood and Affect: Mood normal.        Behavior: Behavior normal.     ED Results / Procedures / Treatments   Labs (all labs ordered are listed, but only abnormal results are displayed) Labs Reviewed  BASIC METABOLIC PANEL WITH GFR - Abnormal; Notable for the following components:      Result Value   Glucose, Bld 117 (*)    All other components within normal limits  CBC  D-DIMER, QUANTITATIVE  TROPONIN T, HIGH SENSITIVITY  TROPONIN T, HIGH SENSITIVITY    EKG EKG  Interpretation Date/Time:  Friday January 18 2024 09:16:13 EDT Ventricular Rate:  65 PR Interval:  138 QRS Duration:  90 QT Interval:  429 QTC Calculation: 447 R Axis:   50  Text Interpretation: Sinus rhythm Consider left atrial enlargement Low voltage, precordial leads Baseline wander mild V3, AVR Confirmed by Jerald Molly [phone]) on 01/18/2024 9:34:24 AM  Radiology DG Chest 2 View Result Date: 01/18/2024 CLINICAL DATA:  Chest pain EXAM: CHEST - 2 VIEW COMPARISON:  None Available. FINDINGS: The heart size and mediastinal contours are within normal limits. Both lungs are clear. The visualized skeletal structures are unremarkable. IMPRESSION: No active cardiopulmonary disease. Electronically Signed   By: Violeta Grey M.D.   On: 01/18/2024 10:27    Procedures Procedures    Medications Ordered in ED Medications - No data to display  ED Course/ Medical Decision Making/ A&P                                 Medical Decision Making Amount and/or Complexity of Data Reviewed Labs: ordered. Radiology: ordered.   This patient presents to the Emergency Department with complaint of chest pain and lightheadedness. This involves an extensive number of treatment options, and is a complaint that carries with it a high risk of complications and morbidity, given the patient's comorbidity, including high cholesterol.The differential diagnosis includes ACS vs Pneumothorax vs Reflux/Gastritis vs MSK pain vs Pneumonia vs other.  I felt PE was less likely given that D-dimer is negative, no other acute risk factors for PE or DVT  I ordered, reviewed, and interpreted labs.  Pertinent results include no emergent findings.  Repeat troponin is undetectable. I ordered imaging studies which included x-ray of the chest I independently visualized and interpreted imaging which showed no emergent findings and the monitor tracing which showed mild sinus bradycardia. I agree with the radiologist  interpretation External records obtained and reviewed showing coronary CT scan from 2024 as noted above with calcium score of 0 I personally reviewed the patients ECG which showed sinus rhythm with no acute ischemic findings  After the interventions stated above, I reevaluated the patient and found that they were minimally symptomatic, no active chest pain during her stay in the ED  Based on the patient's clinical exam, vital signs, risk factors, and ED testing, I felt that the patient's overall risk of life-threatening emergency such as ACS, PE, sepsis, or infection was low.  At this time, I felt the patient's presentation was most clinically consistent with nonspecific chest pain and lightheadedness,  but explained to the patient that this evaluation was not a definitive diagnostic workup.  I discussed outpatient follow up with primary care provider, and provided specialist office number on the patient's discharge paper if a referral was deemed necessary.  Return precautions were discussed with the patient.  I felt the patient was clinically stable for discharge.  The patient's son was present for discussion of her results and to help get her home.         Final Clinical Impression(s) / ED Diagnoses Final diagnoses:  Chest pain, unspecified type  Lightheadedness    Rx / DC Orders ED Discharge Orders     None         Arvilla Birmingham, MD 01/18/24 1226

## 2024-01-18 NOTE — ED Notes (Signed)
 EDP at [facility]

## 2024-01-18 NOTE — ED Triage Notes (Signed)
 Pt caox4, ambulatory statin ghe woke up this morning "just feeling weird." Pt began having CP on the R side of the chest also feeling sweaty and dizzy. Pt states CP lasted approx 5-10 min, and only c/o feeling dizzy at present.

## 2024-01-18 NOTE — ED Notes (Signed)
Back from xray via w/c, alert, NAD, calm, interactive.

## 2024-01-29 ENCOUNTER — Ambulatory Visit: Payer: Medicare Other | Admitting: Gynecologic Oncology

## 2024-01-29 ENCOUNTER — Telehealth: Payer: Self-pay

## 2024-01-29 NOTE — Telephone Encounter (Signed)
 Ms. Bagnasco called office to schedule her canceled appointment with Vira Grieves NP. 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   Pt scheduled for 5/29 @ 3:00pm. She agrees to date/time

## 2024-02-19 ENCOUNTER — Inpatient Hospital Stay: Attending: Gynecologic Oncology | Admitting: Gynecologic Oncology

## 2024-02-19 VITALS — BP 128/78 | HR 73 | Temp 98.2°F | Resp 16 | Ht 63.0 in | Wt 212.0 lb

## 2024-02-19 DIAGNOSIS — M797 Fibromyalgia: Secondary | ICD-10-CM | POA: Insufficient documentation

## 2024-02-19 DIAGNOSIS — Z8542 Personal history of malignant neoplasm of other parts of uterus: Secondary | ICD-10-CM | POA: Diagnosis present

## 2024-02-19 DIAGNOSIS — M1990 Unspecified osteoarthritis, unspecified site: Secondary | ICD-10-CM | POA: Diagnosis not present

## 2024-02-19 DIAGNOSIS — Z90722 Acquired absence of ovaries, bilateral: Secondary | ICD-10-CM | POA: Diagnosis not present

## 2024-02-19 DIAGNOSIS — Z9079 Acquired absence of other genital organ(s): Secondary | ICD-10-CM | POA: Diagnosis not present

## 2024-02-19 DIAGNOSIS — Z90710 Acquired absence of both cervix and uterus: Secondary | ICD-10-CM | POA: Insufficient documentation

## 2024-02-19 DIAGNOSIS — C541 Malignant neoplasm of endometrium: Secondary | ICD-10-CM
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
## 2024-02-19 NOTE — Patient Instructions (Addendum)
 It was great seeing you today. No concerning findings on today's exam.   Plan on following up in the office in November 2025 or sooner if needed.  You can review the information given today and if the exercises do not improve your symptoms or things worsen, you can let me know and we can place a referral to pelvic floor physical therapy.   Symptoms to report to your health care team include vaginal bleeding, rectal bleeding, bloating, weight loss without effort, new and persistent pain, new and  persistent fatigue, new leg swelling, new masses (i.e., bumps in your neck or groin), new and persistent cough, new and persistent nausea and vomiting, change in bowel or bladder habits, and any other concerns.

## 2024-08-14 ENCOUNTER — Encounter: Payer: Self-pay | Admitting: Gynecologic Oncology

## 2024-08-18 ENCOUNTER — Telehealth: Payer: Self-pay | Admitting: *Deleted

## 2024-08-18 NOTE — Telephone Encounter (Signed)
 Spoke with patient who called to reschedule her appt. On 11/25 with Eleanor Epps, NP. Pt was given a new appt. For 12/9 at 3 pm. Pt agreed to date and time and had no further concerns.

## 2024-08-19 ENCOUNTER — Ambulatory Visit: Admitting: Gynecologic Oncology

## 2024-08-19 DIAGNOSIS — C541 Malignant neoplasm of endometrium: Secondary | ICD-10-CM

## 2024-08-28 NOTE — Progress Notes (Signed)
 This encounter was created in error - please disregard.  Appointment encounter opened by clinic staff. Appt was ultimately cancelled and rescheduled.

## 2024-09-01 NOTE — Patient Instructions (Signed)
 It was great seeing you today. No concerning findings on today's exam.   Plan on following up in the office in November 2025 or sooner if needed.  You can review the information given today and if the exercises do not improve your symptoms or things worsen, you can let me know and we can place a referral to pelvic floor physical therapy.   Symptoms to report to your health care team include vaginal bleeding, rectal bleeding, bloating, weight loss without effort, new and persistent pain, new and  persistent fatigue, new leg swelling, new masses (i.e., bumps in your neck or groin), new and persistent cough, new and persistent nausea and vomiting, change in bowel or bladder habits, and any other concerns.
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
## 2024-09-02 ENCOUNTER — Inpatient Hospital Stay: Admitting: Gynecologic Oncology

## 2024-09-02 ENCOUNTER — Telehealth: Payer: Self-pay | Admitting: *Deleted

## 2024-09-02 DIAGNOSIS — C541 Malignant neoplasm of endometrium: Secondary | ICD-10-CM

## 2024-09-02 NOTE — Telephone Encounter (Signed)
 Spoke with patient who called to cancel her appt. Today with Eleanor Epps, NP. 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 And arrive by 2:45. Pt was rescheduled for Tuesday, January 6 th at 2 pm. Pt agreed to date and time and thanked the office.

## 2024-09-30 ENCOUNTER — Inpatient Hospital Stay: Admitting: Gynecologic Oncology

## 2024-09-30 DIAGNOSIS — C541 Malignant neoplasm of endometrium: Secondary | ICD-10-CM
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
## 2024-10-24 ENCOUNTER — Inpatient Hospital Stay: Admitting: Gynecologic Oncology

## 2024-10-24 VITALS — BP 138/66 | HR 64 | Temp 97.7°F | Resp 19 | Wt 222.6 lb

## 2024-10-24 DIAGNOSIS — C541 Malignant neoplasm of endometrium: Secondary | ICD-10-CM

## 2024-10-24 NOTE — Patient Instructions (Signed)
 It was good seeing you today. No concerning findings on today's exam.   We will plan on seeing you in six months or sooner if needed. At that time, you will be at your 5 year anniversary and can graduate for surveillance from our office. We still recommend annual follow up with a gynecologist to continue your well woman care.   Symptoms to report to your health care team include vaginal bleeding, rectal bleeding, bloating, weight loss without effort, new and persistent pain, new and  persistent fatigue, new leg swelling, new masses (i.e., bumps in your neck or groin), new and persistent cough, new and persistent nausea and vomiting, change in bowel or bladder habits, and any other concerns.

## 2025-04-10 ENCOUNTER — Inpatient Hospital Stay: Admitting: Gynecologic Oncology

## 2025-05-05 ENCOUNTER — Inpatient Hospital Stay: Admitting: Gynecologic Oncology
